# Patient Record
Sex: Male | Born: 1960 | Hispanic: No | Marital: Married | Smoking: Former smoker
Health system: Southern US, Community
[De-identification: ages and names within clinical notes are randomized; demographics above are authoritative.]

## PROBLEM LIST (undated history)

## (undated) DIAGNOSIS — E119 Type 2 diabetes mellitus without complications: Secondary | ICD-10-CM

## (undated) DIAGNOSIS — C8330 Diffuse large B-cell lymphoma, unspecified site: Secondary | ICD-10-CM

## (undated) DIAGNOSIS — E039 Hypothyroidism, unspecified: Secondary | ICD-10-CM

## (undated) DIAGNOSIS — I10 Essential (primary) hypertension: Secondary | ICD-10-CM

## (undated) HISTORY — PX: OTHER SURGICAL HISTORY: SHX169

## (undated) HISTORY — PX: CARPAL TUNNEL RELEASE: SHX101

---

## 2015-11-23 DIAGNOSIS — R7303 Prediabetes: Secondary | ICD-10-CM | POA: Insufficient documentation

## 2016-11-12 DIAGNOSIS — K429 Umbilical hernia without obstruction or gangrene: Secondary | ICD-10-CM | POA: Insufficient documentation

## 2017-04-24 ENCOUNTER — Other Ambulatory Visit: Payer: Self-pay

## 2017-04-24 ENCOUNTER — Encounter
Admission: RE | Admit: 2017-04-24 | Discharge: 2017-04-24 | Disposition: A | Discharge status: Home / Self Care | Payer: BLUE CROSS/BLUE SHIELD | Source: Ambulatory Visit | Attending: Surgery | Admitting: Surgery

## 2017-04-24 HISTORY — DX: Essential (primary) hypertension: I10

## 2017-04-24 HISTORY — DX: Hypothyroidism, unspecified: E03.9

## 2017-04-24 NOTE — Patient Instructions (Signed)
Your procedure is scheduled on: 05-02-17 FRIDAY Report to Same Day Surgery 2nd floor medical mall [facility] Entrance-take elevator on left to 2nd floor.  Check in with surgery information desk.) To find out your arrival time please call [phone] between 1PM - 3PM on 05-01-17 THURSDAY  Remember: Instructions that are not followed completely may result in serious medical risk, up to and including death, or upon the discretion of your surgeon and anesthesiologist your surgery may need to be rescheduled.    _x___ 1. Do not eat food after midnight the night before your procedure. NO GUM CHEWING OR CANDY AFTER MIDNIGHT.  You may drink clear liquids up to 2 hours before you are scheduled to arrive at the hospital for your procedure.  Do not drink clear liquids within 2 hours of your scheduled arrival to the hospital.  Clear liquids include  --Water or Apple juice without pulp  --Clear carbohydrate beverage such as ClearFast or Gatorade  --Black Coffee or Clear Tea (No milk, no creamers, do not add anything to the coffee or Tea)      __x__ 2. No Alcohol for 24 hours before or after surgery.   __x__3. No Smoking for 24 prior to surgery.   ____  4. Bring all medications with you on the day of surgery if instructed.    __x__ 5. Notify your doctor if there is any change in your medical condition     (cold, fever, infections).     Do not wear jewelry, make-up, hairpins, clips or nail polish.  Do not wear lotions, powders, or perfumes. You may wear deodorant.  Do not shave 48 hours prior to surgery. Men may shave face and neck.  Do not bring valuables to the hospital.    [facility] is not responsible for any belongings or valuables.               Contacts, dentures or bridgework may not be worn into surgery.  Leave your suitcase in the car. After surgery it may be brought to your room.  For patients admitted to the hospital, discharge time is determined by your treatment  team.   Patients discharged the day of surgery will not be allowed to drive home.  You will need someone to drive you home and stay with you the night of your procedure.    Please read over the following fact sheets that you were given:   [facility] Preparing for Surgery and or MRSA Information   _x___ TAKE THE FOLLOWING MEDICATION THE MORNING OF SURGERY WITH A SMALL SIP OF WATER. These include:  1. LEVOTHYROXINE  2.  3.  4.  5.  6.  ____Fleets enema or Magnesium Citrate as directed.   _x___ Use CHG Soap or sage wipes as directed on instruction sheet   ____ Use inhalers on the day of surgery and bring to hospital day of surgery  ____ Stop Metformin and Janumet 2 days prior to surgery.    ____ Take 1/2 of usual insulin dose the night before surgery and none on the morning surgery.   _x___ Follow recommendations from Cardiologist, Pulmonologist or PCP regarding stopping Aspirin, Coumadin, Plavix ,Eliquis, Effient, or Pradaxa, and Pletal-PT AWARE TO STOP ASPIRIN 1 WEEK PRIOR TO SURGERY  X____Stop Anti-inflammatories such as Advil, Aleve, IBUPROFEN, Motrin, Naproxen, Naprosyn, Goodies powders or aspirin products-STOP IBUPROFEN 48 HOURS PRIOR TO SURGERY-OK to take Tylenol WITHIN THOSE 2 DAYS   _x___ Stop supplements until after surgery-STOP GINGKO BILOBA NOW-MAY RESUME  AFTER SURGERY   ____ Bring C-Pap to the hospital.

## 2017-04-28 ENCOUNTER — Encounter
Admission: RE | Admit: 2017-04-28 | Discharge: 2017-04-28 | Disposition: A | Discharge status: Home / Self Care | Payer: BLUE CROSS/BLUE SHIELD | Source: Ambulatory Visit | Attending: Surgery | Admitting: Surgery

## 2017-04-28 DIAGNOSIS — I10 Essential (primary) hypertension: Secondary | ICD-10-CM | POA: Insufficient documentation

## 2017-05-01 ENCOUNTER — Encounter: Payer: Self-pay | Admitting: *Deleted

## 2017-05-01 MED ORDER — CEFAZOLIN SODIUM 10 G IJ SOLR
3.0000 g | Freq: Once | INTRAMUSCULAR | Status: AC
  Administered 2017-05-02: 3 g via INTRAVENOUS
  Filled 2017-05-01: qty 3000

## 2017-05-02 ENCOUNTER — Ambulatory Visit
Admission: RE | Admit: 2017-05-02 | Discharge: 2017-05-02 | Disposition: A | Discharge status: Home / Self Care | Payer: BLUE CROSS/BLUE SHIELD | Source: Ambulatory Visit | Attending: Surgery | Admitting: Surgery

## 2017-05-02 ENCOUNTER — Ambulatory Visit: Payer: BLUE CROSS/BLUE SHIELD | Admitting: Anesthesiology

## 2017-05-02 ENCOUNTER — Encounter
Admission: RE | Disposition: A | Discharge status: Home / Self Care | Payer: Self-pay | Source: Ambulatory Visit | Attending: Surgery

## 2017-05-02 DIAGNOSIS — Z79890 Hormone replacement therapy (postmenopausal): Secondary | ICD-10-CM | POA: Insufficient documentation

## 2017-05-02 DIAGNOSIS — Z6841 Body Mass Index (BMI) 40.0 and over, adult: Secondary | ICD-10-CM | POA: Diagnosis not present

## 2017-05-02 DIAGNOSIS — E039 Hypothyroidism, unspecified: Secondary | ICD-10-CM | POA: Insufficient documentation

## 2017-05-02 DIAGNOSIS — I10 Essential (primary) hypertension: Secondary | ICD-10-CM | POA: Insufficient documentation

## 2017-05-02 DIAGNOSIS — K429 Umbilical hernia without obstruction or gangrene: Secondary | ICD-10-CM | POA: Diagnosis not present

## 2017-05-02 DIAGNOSIS — J449 Chronic obstructive pulmonary disease, unspecified: Secondary | ICD-10-CM | POA: Diagnosis not present

## 2017-05-02 DIAGNOSIS — Z87891 Personal history of nicotine dependence: Secondary | ICD-10-CM | POA: Insufficient documentation

## 2017-05-02 HISTORY — PX: UMBILICAL HERNIA REPAIR: SHX196

## 2017-05-02 HISTORY — DX: Morbid (severe) obesity due to excess calories: E66.01

## 2017-05-02 HISTORY — PX: INSERTION OF MESH: SHX5868

## 2017-05-02 SURGERY — REPAIR, HERNIA, UMBILICAL, ADULT
Anesthesia: General | Site: Abdomen | Wound class: Clean

## 2017-05-02 MED ORDER — SUCCINYLCHOLINE CHLORIDE 20 MG/ML IJ SOLN
INTRAMUSCULAR | Status: DC | PRN
  Administered 2017-05-02: 200 mg via INTRAVENOUS

## 2017-05-02 MED ORDER — FENTANYL CITRATE (PF) 100 MCG/2ML IJ SOLN
INTRAMUSCULAR | Status: AC
  Filled 2017-05-02: qty 2

## 2017-05-02 MED ORDER — BUPIVACAINE HCL (PF) 0.5 % IJ SOLN
INTRAMUSCULAR | Status: AC
  Filled 2017-05-02: qty 30

## 2017-05-02 MED ORDER — MIDAZOLAM HCL 2 MG/2ML IJ SOLN
INTRAMUSCULAR | Status: DC | PRN
  Administered 2017-05-02: 2 mg via INTRAVENOUS

## 2017-05-02 MED ORDER — DEXAMETHASONE SODIUM PHOSPHATE 10 MG/ML IJ SOLN
INTRAMUSCULAR | Status: DC | PRN
  Administered 2017-05-02: 10 mg via INTRAVENOUS

## 2017-05-02 MED ORDER — SUGAMMADEX SODIUM 500 MG/5ML IV SOLN
INTRAVENOUS | Status: AC
  Filled 2017-05-02: qty 5

## 2017-05-02 MED ORDER — SUGAMMADEX SODIUM 200 MG/2ML IV SOLN
INTRAVENOUS | Status: DC | PRN
  Administered 2017-05-02: 301.2 mg via INTRAVENOUS

## 2017-05-02 MED ORDER — PROPOFOL 10 MG/ML IV BOLUS
INTRAVENOUS | Status: AC
  Filled 2017-05-02: qty 20

## 2017-05-02 MED ORDER — LIDOCAINE HCL (PF) 2 % IJ SOLN
INTRAMUSCULAR | Status: AC
  Filled 2017-05-02: qty 10

## 2017-05-02 MED ORDER — FAMOTIDINE 20 MG PO TABS
20.0000 mg | ORAL_TABLET | Freq: Once | ORAL | Status: AC
  Administered 2017-05-02: 20 mg via ORAL

## 2017-05-02 MED ORDER — DEXAMETHASONE SODIUM PHOSPHATE 10 MG/ML IJ SOLN
INTRAMUSCULAR | Status: AC
  Filled 2017-05-02: qty 1

## 2017-05-02 MED ORDER — ONDANSETRON HCL 4 MG/2ML IJ SOLN
INTRAMUSCULAR | Status: AC
  Filled 2017-05-02: qty 2

## 2017-05-02 MED ORDER — OXYCODONE HCL 5 MG PO TABS
ORAL_TABLET | ORAL | Status: AC
  Filled 2017-05-02: qty 1

## 2017-05-02 MED ORDER — LACTATED RINGERS IV SOLN
INTRAVENOUS | Status: DC
  Administered 2017-05-02 (×2): via INTRAVENOUS

## 2017-05-02 MED ORDER — FENTANYL CITRATE (PF) 100 MCG/2ML IJ SOLN: 25.0000 ug | INTRAMUSCULAR | Status: DC | PRN

## 2017-05-02 MED ORDER — HYDROCODONE-ACETAMINOPHEN 5-325 MG PO TABS: 1.0000 | ORAL_TABLET | Freq: Four times a day (QID) | ORAL | 0 refills | Status: DC | PRN

## 2017-05-02 MED ORDER — OXYCODONE HCL 5 MG PO TABS
5.0000 mg | ORAL_TABLET | Freq: Once | ORAL | Status: AC | PRN
  Administered 2017-05-02: 5 mg via ORAL

## 2017-05-02 MED ORDER — LIDOCAINE HCL (CARDIAC) 20 MG/ML IV SOLN
INTRAVENOUS | Status: DC | PRN
  Administered 2017-05-02: 100 mg via INTRAVENOUS

## 2017-05-02 MED ORDER — FAMOTIDINE 20 MG PO TABS
ORAL_TABLET | ORAL | Status: AC
  Administered 2017-05-02: 20 mg via ORAL
  Filled 2017-05-02: qty 1

## 2017-05-02 MED ORDER — OXYCODONE HCL 5 MG/5ML PO SOLN: 5.0000 mg | Freq: Once | ORAL | Status: AC | PRN

## 2017-05-02 MED ORDER — ROCURONIUM BROMIDE 50 MG/5ML IV SOLN
INTRAVENOUS | Status: AC
  Filled 2017-05-02: qty 1

## 2017-05-02 MED ORDER — PROMETHAZINE HCL 25 MG/ML IJ SOLN: 6.2500 mg | INTRAMUSCULAR | Status: DC | PRN

## 2017-05-02 MED ORDER — MEPERIDINE HCL 50 MG/ML IJ SOLN: 6.2500 mg | INTRAMUSCULAR | Status: DC | PRN

## 2017-05-02 MED ORDER — ONDANSETRON HCL 4 MG/2ML IJ SOLN
INTRAMUSCULAR | Status: DC | PRN
  Administered 2017-05-02: 4 mg via INTRAVENOUS

## 2017-05-02 MED ORDER — FENTANYL CITRATE (PF) 100 MCG/2ML IJ SOLN
INTRAMUSCULAR | Status: DC | PRN
  Administered 2017-05-02 (×2): 100 ug via INTRAVENOUS

## 2017-05-02 MED ORDER — BUPIVACAINE-EPINEPHRINE (PF) 0.5% -1:200000 IJ SOLN
INTRAMUSCULAR | Status: DC | PRN
  Administered 2017-05-02: 9 mL

## 2017-05-02 MED ORDER — SUCCINYLCHOLINE CHLORIDE 20 MG/ML IJ SOLN
INTRAMUSCULAR | Status: AC
  Filled 2017-05-02: qty 1

## 2017-05-02 MED ORDER — ROCURONIUM BROMIDE 100 MG/10ML IV SOLN
INTRAVENOUS | Status: DC | PRN
  Administered 2017-05-02: 25 mg via INTRAVENOUS
  Administered 2017-05-02: 10 mg via INTRAVENOUS
  Administered 2017-05-02: 40 mg via INTRAVENOUS

## 2017-05-02 MED ORDER — PROPOFOL 10 MG/ML IV BOLUS
INTRAVENOUS | Status: DC | PRN
  Administered 2017-05-02: 200 mg via INTRAVENOUS

## 2017-05-02 MED ORDER — MIDAZOLAM HCL 2 MG/2ML IJ SOLN
INTRAMUSCULAR | Status: AC
  Filled 2017-05-02: qty 2

## 2017-05-02 SURGICAL SUPPLY — 25 items
BLADE SURG 15 STRL LF DISP TIS (BLADE) ×1 IMPLANT
BLADE SURG 15 STRL SS (BLADE) ×1
CANISTER SUCT 1200ML W/VALVE (MISCELLANEOUS) ×2 IMPLANT
CHLORAPREP W/TINT 26ML (MISCELLANEOUS) ×2 IMPLANT
DERMABOND ADVANCED (GAUZE/BANDAGES/DRESSINGS) ×1
DERMABOND ADVANCED .7 DNX12 (GAUZE/BANDAGES/DRESSINGS) ×1 IMPLANT
DRAPE LAPAROTOMY 77X122 PED (DRAPES) ×2 IMPLANT
ELECT REM PT RETURN 9FT ADLT (ELECTROSURGICAL) ×2
ELECTRODE REM PT RTRN 9FT ADLT (ELECTROSURGICAL) ×1 IMPLANT
GLOVE BIO SURGEON STRL SZ7.5 (GLOVE) ×2 IMPLANT
GOWN STRL REUS W/ TWL LRG LVL3 (GOWN DISPOSABLE) ×4 IMPLANT
GOWN STRL REUS W/TWL LRG LVL3 (GOWN DISPOSABLE) ×4
KIT RM TURNOVER STRD PROC AR (KITS) ×2 IMPLANT
LABEL OR SOLS (LABEL) ×2 IMPLANT
MESH SYNTHETIC 4X6 SOFT BARD (Mesh General) ×1 IMPLANT
MESH SYNTHETIC SOFT BARD 4X6 (Mesh General) ×1 IMPLANT
NEEDLE HYPO 25X1 1.5 SAFETY (NEEDLE) ×2 IMPLANT
NS IRRIG 500ML POUR BTL (IV SOLUTION) ×2 IMPLANT
PACK BASIN MINOR ARMC (MISCELLANEOUS) ×2 IMPLANT
SUT CHROMIC 3 0 SH 27 (SUTURE) ×2 IMPLANT
SUT CHROMIC 4 0 RB 1X27 (SUTURE) ×2 IMPLANT
SUT MNCRL+ 5-0 UNDYED PC-3 (SUTURE) ×1 IMPLANT
SUT MONOCRYL 5-0 (SUTURE) ×1
SUT SURGILON 0 30 BLK (SUTURE) ×4 IMPLANT
SYRINGE 10CC LL (SYRINGE) ×2 IMPLANT

## 2017-05-02 NOTE — Op Note (Signed)
OPERATIVE REPORT  PREOPERATIVE  DIAGNOSIS: Umbilical hernia  POSTOPERATIVE DIAGNOSIS: Umbilical hernia  PROCEDURE: Umbilical hernia repair  ANESTHESIA: General  SURGEON: Rochel Brome M.D.  INDICATIONS: He has a history of bulging at the umbilicus.  An umbilical hernia was demonstrated on physical exam and repair was recommended for definitive treatment.  The patient was placed on the operating table in the supine position under general anesthesia. The abdomen was prepared with ChloraPrep and draped in a sterile manner. A transversely oriented supraumbilical curvilinear incision was made and carried down through subcutaneous tissues.  The umbilical hernia sac was dissected free from the umbilicus and dissected free from surrounding tissues down into the fascial ring defect.  The sac was approximately 6 cm in length.  There was incarcerated fatty tissue which was reduced back into the abdominal cavity.  The sac was suture ligated with 3-0 chromic and amputated and was not submitted for pathology.  Bard soft mesh was cut to create an oval shape of 2.5 x 3.5 centimeters. It was placed into the properitoneal plane oriented transversely and sutured to the overlying fascia with through and through 0 Surgilon sutures.  The fascial defect was closed with a transversely oriented suture line of interrupted 0 Surgilon figure-of-eight sutures incorporating each suture into the mesh. The skin of the umbilicus was sutured to the subcutaneous tissues with 3-0 chromic suture. The subcutaneous tissues were infiltrated with half percent Sensorcaine with epinephrine. The skin was closed with running 5-0 Monocryl subcuticular suture and Dermabond.  The patient appeared to be in satisfactory condition and was prepared for transfer to the recovery room  [place].D.

## 2017-05-02 NOTE — Anesthesia Procedure Notes (Addendum)
Procedure Name: Intubation Date/Time: 05/02/2017 2:43 PM Performed by: Nelda Marseille, CRNA Pre-anesthesia Checklist: Patient identified, Patient being monitored, Timeout performed, Emergency Drugs available and Suction available Patient Re-evaluated:Patient Re-evaluated prior to induction Oxygen Delivery Method: Circle system utilized Preoxygenation: Pre-oxygenation with 100% oxygen Induction Type: IV induction Ventilation: Mask ventilation without difficulty Laryngoscope Size: Mac, McGraph and 4 Grade View: Grade III Tube type: Oral Tube size: 7.5 mm Number of attempts: 1 Airway Equipment and Method: Stylet Placement Confirmation: ETT inserted through vocal cords under direct vision,  positive ETCO2 and breath sounds checked- equal and bilateral Secured at: 24 cm Tube secured with: Tape Dental Injury: Teeth and Oropharynx as per pre-operative assessment

## 2017-05-02 NOTE — Anesthesia Post-op Follow-up Note (Signed)
Anesthesia QCDR form completed.        

## 2017-05-02 NOTE — Anesthesia Preprocedure Evaluation (Signed)
Anesthesia Evaluation  Patient identified by MRN, date of birth, ID band Patient awake    Reviewed: Allergy & Precautions, NPO status , Patient's Chart, lab work & pertinent test results  History of Anesthesia Complications Negative for: history of anesthetic complications  Airway Mallampati: II  TM Distance: >3 FB Neck ROM: Full    Dental no notable dental hx.    Pulmonary neg sleep apnea, neg COPD, former smoker,    breath sounds clear to auscultation- rhonchi (-) wheezing      Cardiovascular hypertension, Pt. on medications (-) CAD, (-) Past MI and (-) Cardiac Stents  Rhythm:Regular Rate:Normal - Systolic murmurs and - Diastolic murmurs    Neuro/Psych negative neurological ROS  negative psych ROS   GI/Hepatic negative GI ROS, Neg liver ROS,   Endo/Other  neg diabetesHypothyroidism   Renal/GU negative Renal ROS     Musculoskeletal negative musculoskeletal ROS (+)   Abdominal   Peds  Hematology negative hematology ROS (+)   Anesthesia Other Findings Past Medical History: No date: Hypertension No date: Hypothyroidism No date: Morbid obesity ([place])   Reproductive/Obstetrics                             Anesthesia Physical Anesthesia Plan  ASA: II  Anesthesia Plan: General   Post-op Pain Management:    Induction: Intravenous  PONV Risk Score and Plan: 1 and Dexamethasone and Ondansetron  Airway Management Planned: Oral ETT  Additional Equipment:   Intra-op Plan:   Post-operative Plan: Extubation in OR  Informed Consent: I have reviewed the patients History and Physical, chart, labs and discussed the procedure including the risks, benefits and alternatives for the proposed anesthesia with the patient or authorized representative who has indicated his/her understanding and acceptance.   Dental advisory given  Plan Discussed with: CRNA and Anesthesiologist  Anesthesia Plan  Comments:         Anesthesia Quick Evaluation

## 2017-05-02 NOTE — Transfer of Care (Signed)
Immediate Anesthesia Transfer of Care Note  Patient: Zachary Perkins  Procedure(s) Performed: HERNIA REPAIR UMBILICAL ADULT (N/A Abdomen) INSERTION OF MESH (N/A Abdomen)  Patient Location: PACU  Anesthesia Type:General  Level of Consciousness: awake, alert  and oriented  Airway & Oxygen Therapy: Patient Spontanous Breathing and Patient connected to face mask oxygen  Post-op Assessment: Report given to RN and Post -op Vital signs reviewed and stable  Post vital signs: Reviewed and stable  Last Vitals:  Vitals:   05/02/17 1250 05/02/17 1603  BP: (!) 145/88 (!) 106/57  Pulse: 62 62  Resp: 18 12  Temp: 36.8 C (!) 36.3 C  SpO2: 100% 100%    Last Pain:  Vitals:   05/02/17 1250  TempSrc: Oral         Complications: No apparent anesthesia complications

## 2017-05-02 NOTE — Discharge Instructions (Addendum)
Take Tylenol or Norco if needed for pain.  She will not drive or do anything dangerous when taking Norco.  May resume aspirin on Sunday.  May shower and blot dry.  Avoid straining and heavy lifting   AMBULATORY SURGERY  DISCHARGE INSTRUCTIONS   1) The drugs that you were given will stay in your system until tomorrow so for the next 24 hours you should not:  A) Drive an automobile B) Make any legal decisions C) Drink any alcoholic beverage   2) You may resume regular meals tomorrow.  Today it is better to start with liquids and gradually work up to solid foods.  You may eat anything you prefer, but it is better to start with liquids, then soup and crackers, and gradually work up to solid foods.   3) Please notify your doctor immediately if you have any unusual bleeding, trouble breathing, redness and pain at the surgery site, drainage, fever, or pain not relieved by medication.   4) Additional Instructions:Splint abdominal incision with pillow when you cough, sneeze or move.

## 2017-05-02 NOTE — OR Nursing (Signed)
Dr. Tamala Julian into see pt, ok to dc home.

## 2017-05-02 NOTE — H&P (Signed)
  He comes then prepared for umbilical hernia repair. He reports no change in overall condition since the office exam. Lab work was removed. The hernia was identified on physical exam. I discussed the plan for surgery.

## 2017-05-02 NOTE — Anesthesia Postprocedure Evaluation (Signed)
Anesthesia Post Note  Patient: Zachary Perkins  Procedure(s) Performed: HERNIA REPAIR UMBILICAL ADULT (N/A Abdomen) INSERTION OF MESH (N/A Abdomen)  Patient location during evaluation: PACU Anesthesia Type: General Level of consciousness: awake and alert and oriented Pain management: pain level controlled Vital Signs Assessment: post-procedure vital signs reviewed and stable Respiratory status: spontaneous breathing, nonlabored ventilation and respiratory function stable Cardiovascular status: blood pressure returned to baseline and stable Postop Assessment: no signs of nausea or vomiting Anesthetic complications: no     Last Vitals:  Vitals:   05/02/17 1618 05/02/17 1626  BP: 135/87   Pulse: (!) 58 (!) 58  Resp: 10 (!) 7  Temp:    SpO2: 98% 95%    Last Pain:  Vitals:   05/02/17 1614  TempSrc:   PainSc: 4                  Isaac Dubie

## 2017-05-05 ENCOUNTER — Encounter: Payer: Self-pay | Admitting: Surgery

## 2018-05-19 ENCOUNTER — Emergency Department
Admission: EM | Admit: 2018-05-19 | Discharge: 2018-05-19 | Disposition: A | Discharge status: Home / Self Care | Payer: BLUE CROSS/BLUE SHIELD | Attending: Emergency Medicine | Admitting: Emergency Medicine

## 2018-05-19 ENCOUNTER — Other Ambulatory Visit: Payer: Self-pay

## 2018-05-19 ENCOUNTER — Emergency Department: Payer: BLUE CROSS/BLUE SHIELD

## 2018-05-19 DIAGNOSIS — M79602 Pain in left arm: Secondary | ICD-10-CM | POA: Insufficient documentation

## 2018-05-19 DIAGNOSIS — J069 Acute upper respiratory infection, unspecified: Secondary | ICD-10-CM | POA: Diagnosis not present

## 2018-05-19 DIAGNOSIS — E039 Hypothyroidism, unspecified: Secondary | ICD-10-CM | POA: Diagnosis not present

## 2018-05-19 DIAGNOSIS — R0602 Shortness of breath: Secondary | ICD-10-CM | POA: Diagnosis present

## 2018-05-19 DIAGNOSIS — I10 Essential (primary) hypertension: Secondary | ICD-10-CM | POA: Insufficient documentation

## 2018-05-19 DIAGNOSIS — Z87891 Personal history of nicotine dependence: Secondary | ICD-10-CM | POA: Insufficient documentation

## 2018-05-19 DIAGNOSIS — E119 Type 2 diabetes mellitus without complications: Secondary | ICD-10-CM | POA: Insufficient documentation

## 2018-05-19 HISTORY — DX: Type 2 diabetes mellitus without complications: E11.9

## 2018-05-19 LAB — CBC
HCT: 42.9 % (ref 39.0–52.0)
Hemoglobin: 14.1 g/dL (ref 13.0–17.0)
MCH: 27.2 pg (ref 26.0–34.0)
MCHC: 32.9 g/dL (ref 30.0–36.0)
MCV: 82.8 fL (ref 80.0–100.0)
Platelets: 289 10*3/uL (ref 150–400)
RBC: 5.18 MIL/uL (ref 4.22–5.81)
RDW: 12.8 % (ref 11.5–15.5)
WBC: 7.2 10*3/uL (ref 4.0–10.5)
nRBC: 0 % (ref 0.0–0.2)

## 2018-05-19 LAB — BASIC METABOLIC PANEL
Anion gap: 10 (ref 5–15)
BUN: 17 mg/dL (ref 6–20)
CO2: 27 mmol/L (ref 22–32)
Calcium: 8.9 mg/dL (ref 8.9–10.3)
Chloride: 100 mmol/L (ref 98–111)
Creatinine, Ser: 0.82 mg/dL (ref 0.61–1.24)
GFR calc non Af Amer: >60 mL/min (ref >60)
Glucose, Bld: 130 mg/dL — ABNORMAL HIGH (ref 70–99)
Potassium: 3.3 mmol/L — ABNORMAL LOW (ref 3.5–5.1)
Sodium: 137 mmol/L (ref 135–145)

## 2018-05-19 LAB — TROPONIN I: Troponin I: <0.03 ng/mL (ref <0.03)

## 2018-05-19 NOTE — ED Notes (Signed)
Pt was seen leaving room as MD was printing off his discharge papers. They were taken to the front desk if pt returns for them. NAD.

## 2018-05-19 NOTE — ED Triage Notes (Addendum)
C/o SOB, cough, and congestion yesterday. Left upper arm pain, described and aching and tingling that began today after his workout today. Equal grip strength, motor WNL of upper extremities.  Pt alert and oriented X4, active, cooperative, pt in NAD. RR even and unlabored, color WNL.

## 2018-05-19 NOTE — ED Provider Notes (Signed)
[facility] Emergency Department Provider Note       Time seen: ----------------------------------------- 2:10 PM on 05/19/2018 -----------------------------------------   I have reviewed the triage vital signs and the nursing notes.  HISTORY   Chief Complaint Shortness of Breath and Arm Pain    HPI Zachary Perkins is a 57 y.o. male with a history of borderline diabetes, hypertension, hypothyroidism and morbid obesity who presents to the ED for shortness of breath and cough and congestion since yesterday.  Patient states even while he is talking he feels phlegm in the back of his throat.  He has describes some left upper arm pain, describes as aching and tingling that began after his workout today.  He typically does cardio using an elliptical machine.  He denies any specific chest pain.  Past Medical History:  Diagnosis Date  . Diabetes mellitus without complication ([place])   . Hypertension   . Hypothyroidism   . Morbid obesity ([place])     There are no active problems to display for this patient.   Past Surgical History:  Procedure Laterality Date  . CARPAL TUNNEL RELEASE Left   . INSERTION OF MESH N/A 05/02/2017   Procedure: INSERTION OF MESH;  Surgeon: Leonie Green, MD;  Location: [facility] ORS;  Service: General;  Laterality: N/A;  . UMBILICAL HERNIA REPAIR N/A 05/02/2017   Procedure: HERNIA REPAIR UMBILICAL ADULT;  Surgeon: Leonie Green, MD;  Location: [facility] ORS;  Service: General;  Laterality: N/A;    Allergies Lisinopril and Metformin and related  Social History Social History   Tobacco Use  . Smoking status: Former Smoker    Packs/day: 1.00    Years: 20.00    Pack years: 20.00    Types: Cigarettes    Last attempt to quit: 04/25/2007    Years since quitting: 11.0  . Smokeless tobacco: Never Used  Substance Use Topics  . Alcohol use: Yes    Comment: rare  . Drug use: No   Review of Systems Constitutional: Negative for  fever. Cardiovascular: Negative for chest pain. ENT: Positive for congestion Respiratory: Positive for shortness of breath Gastrointestinal: Negative for abdominal pain, vomiting and diarrhea. Musculoskeletal: Positive for left arm pain Skin: Negative for rash. Neurological: Negative for headaches, focal weakness or numbness.  All systems negative/normal/unremarkable except as stated in the HPI  ____________________________________________   PHYSICAL EXAM:  VITAL SIGNS: ED Triage Vitals  Enc Vitals Group     BP --      Pulse Rate 05/19/18 1132 61     Resp --      Temp 05/19/18 1132 98.2 F (36.8 C)     Temp Source 05/19/18 1132 Oral     SpO2 05/19/18 1132 97 %     Weight 05/19/18 1135 (!) 325 lb (147.4 kg)     Height 05/19/18 1135 6\' 5"  (1.956 m)     Head Circumference --      Peak Flow --      Pain Score 05/19/18 1135 2     Pain Loc --      Pain Edu? --      Excl. in [place]? --    Constitutional: Alert and oriented. Well appearing and in no distress. Eyes: Conjunctivae are normal. Normal extraocular movements. ENT   Head: Normocephalic and atraumatic.   Nose: No congestion/rhinnorhea.   Mouth/Throat: Mucous membranes are moist.   Neck: No stridor. Cardiovascular: Normal rate, regular rhythm. No murmurs, rubs, or gallops. Respiratory: Normal respiratory effort without  tachypnea nor retractions. Breath sounds are clear and equal bilaterally. No wheezes/rales/rhonchi. Gastrointestinal: Soft and nontender. Normal bowel sounds Musculoskeletal: Nontender with normal range of motion in extremities. No lower extremity tenderness nor edema. Neurologic:  Normal speech and language. No gross focal neurologic deficits are appreciated.  Skin:  Skin is warm, dry and intact. No rash noted. Psychiatric: Mood and affect are normal. Speech and behavior are normal.  ____________________________________________  EKG: Interpreted by me.  Sinus rhythm the rate of 63 bpm,  borderline LVH criteria, normal axis, normal QT  ____________________________________________  ED COURSE:  As part of my medical decision making, I reviewed the following data within the [place] History obtained from family if available, nursing notes, old chart and ekg, as well as notes from prior ED visits. Patient presented for upper respiratory complaints as well as left arm pain, we will assess with labs and imaging as indicated at this time.   Procedures ____________________________________________   LABS (pertinent positives/negatives)  Labs Reviewed  BASIC METABOLIC PANEL - Abnormal; Notable for the following components:      Result Value   Potassium 3.3 (*)    Glucose, Bld 130 (*)    All other components within normal limits  CBC  TROPONIN I    RADIOLOGY  Chest x-ray is unremarkable  ____________________________________________  DIFFERENTIAL DIAGNOSIS   Musculoskeletal pain, URI, pneumonia, GERD, sinusitis, unstable angina  FINAL ASSESSMENT AND PLAN  URI, left arm pain   Plan: The patient had presented for congestion and upper respiratory symptoms as well as left arm pain after exercising. Patient's labs were unremarkable. Patient's imaging was also unremarkable.  I have advised over-the-counter treatment.  He is cleared for outpatient follow-up.   Laurence Aly, MD   Note: This note was generated in part or whole with voice recognition software. Voice recognition is usually quite accurate but there are transcription errors that can and very often do occur. I apologize for any typographical errors that were not detected and corrected.     Earleen Newport, MD 05/19/18 [phone]

## 2021-02-20 ENCOUNTER — Encounter: Payer: Self-pay | Admitting: Emergency Medicine

## 2021-02-20 ENCOUNTER — Emergency Department: Payer: BC Managed Care – PPO

## 2021-02-20 ENCOUNTER — Other Ambulatory Visit: Payer: Self-pay

## 2021-02-20 ENCOUNTER — Inpatient Hospital Stay
Admission: EM | Admit: 2021-02-20 | Discharge: 2021-02-26 | DRG: 842 | Disposition: A | Discharge status: Home / Self Care | Payer: BC Managed Care – PPO | Attending: Internal Medicine | Admitting: Internal Medicine

## 2021-02-20 DIAGNOSIS — K2970 Gastritis, unspecified, without bleeding: Secondary | ICD-10-CM | POA: Diagnosis present

## 2021-02-20 DIAGNOSIS — R634 Abnormal weight loss: Secondary | ICD-10-CM | POA: Diagnosis present

## 2021-02-20 DIAGNOSIS — Z79890 Hormone replacement therapy (postmenopausal): Secondary | ICD-10-CM | POA: Diagnosis not present

## 2021-02-20 DIAGNOSIS — G43909 Migraine, unspecified, not intractable, without status migrainosus: Secondary | ICD-10-CM | POA: Diagnosis present

## 2021-02-20 DIAGNOSIS — E039 Hypothyroidism, unspecified: Secondary | ICD-10-CM | POA: Diagnosis present

## 2021-02-20 DIAGNOSIS — Z87891 Personal history of nicotine dependence: Secondary | ICD-10-CM

## 2021-02-20 DIAGNOSIS — K5909 Other constipation: Secondary | ICD-10-CM | POA: Diagnosis not present

## 2021-02-20 DIAGNOSIS — R110 Nausea: Secondary | ICD-10-CM | POA: Diagnosis present

## 2021-02-20 DIAGNOSIS — E119 Type 2 diabetes mellitus without complications: Secondary | ICD-10-CM | POA: Diagnosis present

## 2021-02-20 DIAGNOSIS — D509 Iron deficiency anemia, unspecified: Secondary | ICD-10-CM | POA: Diagnosis present

## 2021-02-20 DIAGNOSIS — K2980 Duodenitis without bleeding: Secondary | ICD-10-CM | POA: Diagnosis present

## 2021-02-20 DIAGNOSIS — Z20822 Contact with and (suspected) exposure to covid-19: Secondary | ICD-10-CM | POA: Diagnosis present

## 2021-02-20 DIAGNOSIS — Z6833 Body mass index (BMI) 33.0-33.9, adult: Secondary | ICD-10-CM | POA: Diagnosis not present

## 2021-02-20 DIAGNOSIS — E669 Obesity, unspecified: Secondary | ICD-10-CM | POA: Diagnosis present

## 2021-02-20 DIAGNOSIS — Z79899 Other long term (current) drug therapy: Secondary | ICD-10-CM | POA: Diagnosis not present

## 2021-02-20 DIAGNOSIS — D649 Anemia, unspecified: Secondary | ICD-10-CM | POA: Diagnosis not present

## 2021-02-20 DIAGNOSIS — C8590 Non-Hodgkin lymphoma, unspecified, unspecified site: Secondary | ICD-10-CM

## 2021-02-20 DIAGNOSIS — C8513 Unspecified B-cell lymphoma, intra-abdominal lymph nodes: Principal | ICD-10-CM | POA: Diagnosis present

## 2021-02-20 DIAGNOSIS — K5900 Constipation, unspecified: Secondary | ICD-10-CM | POA: Diagnosis present

## 2021-02-20 DIAGNOSIS — Z7982 Long term (current) use of aspirin: Secondary | ICD-10-CM | POA: Diagnosis not present

## 2021-02-20 DIAGNOSIS — K3189 Other diseases of stomach and duodenum: Secondary | ICD-10-CM | POA: Diagnosis present

## 2021-02-20 DIAGNOSIS — Z888 Allergy status to other drugs, medicaments and biological substances status: Secondary | ICD-10-CM

## 2021-02-20 DIAGNOSIS — I10 Essential (primary) hypertension: Secondary | ICD-10-CM | POA: Diagnosis present

## 2021-02-20 DIAGNOSIS — C8598 Non-Hodgkin lymphoma, unspecified, lymph nodes of multiple sites: Secondary | ICD-10-CM | POA: Diagnosis not present

## 2021-02-20 DIAGNOSIS — R1902 Left upper quadrant abdominal swelling, mass and lump: Secondary | ICD-10-CM | POA: Diagnosis not present

## 2021-02-20 DIAGNOSIS — D500 Iron deficiency anemia secondary to blood loss (chronic): Secondary | ICD-10-CM

## 2021-02-20 DIAGNOSIS — R109 Unspecified abdominal pain: Secondary | ICD-10-CM

## 2021-02-20 LAB — COMPREHENSIVE METABOLIC PANEL
ALT: 30 U/L (ref 0–44)
AST: 28 U/L (ref 15–41)
Albumin: 3.2 g/dL — ABNORMAL LOW (ref 3.5–5.0)
Alkaline Phosphatase: 63 U/L (ref 38–126)
Anion gap: 10 (ref 5–15)
BUN: 20 mg/dL (ref 6–20)
CO2: 29 mmol/L (ref 22–32)
Calcium: 11.7 mg/dL — ABNORMAL HIGH (ref 8.9–10.3)
Chloride: 97 mmol/L — ABNORMAL LOW (ref 98–111)
Creatinine, Ser: 1.23 mg/dL (ref 0.61–1.24)
GFR, Estimated: >60 mL/min (ref >60)
Glucose, Bld: 111 mg/dL — ABNORMAL HIGH (ref 70–99)
Potassium: 3.6 mmol/L (ref 3.5–5.1)
Sodium: 136 mmol/L (ref 135–145)
Total Bilirubin: 0.3 mg/dL (ref 0.3–1.2)
Total Protein: 7.2 g/dL (ref 6.5–8.1)

## 2021-02-20 LAB — URINALYSIS, COMPLETE (UACMP) WITH MICROSCOPIC
Bacteria, UA: NONE SEEN
Bilirubin Urine: NEGATIVE
Glucose, UA: NEGATIVE mg/dL
Hgb urine dipstick: NEGATIVE
Ketones, ur: NEGATIVE mg/dL
Leukocytes,Ua: NEGATIVE
Nitrite: NEGATIVE
Protein, ur: NEGATIVE mg/dL
Specific Gravity, Urine: 1.01 (ref 1.005–1.030)
pH: 5.5 (ref 5.0–8.0)

## 2021-02-20 LAB — IRON AND TIBC
Iron: 16 ug/dL — ABNORMAL LOW (ref 45–182)
Saturation Ratios: 6 % — ABNORMAL LOW (ref 17.9–39.5)
TIBC: 281 ug/dL (ref 250–450)
UIBC: 265 ug/dL

## 2021-02-20 LAB — FERRITIN: Ferritin: 58 ng/mL (ref 24–336)

## 2021-02-20 LAB — CBC
HCT: 23.3 % — ABNORMAL LOW (ref 39.0–52.0)
Hemoglobin: 7 g/dL — ABNORMAL LOW (ref 13.0–17.0)
MCH: 21.8 pg — ABNORMAL LOW (ref 26.0–34.0)
MCHC: 30 g/dL (ref 30.0–36.0)
MCV: 72.6 fL — ABNORMAL LOW (ref 80.0–100.0)
Platelets: 325 10*3/uL (ref 150–400)
RBC: 3.21 MIL/uL — ABNORMAL LOW (ref 4.22–5.81)
RDW: 16.1 % — ABNORMAL HIGH (ref 11.5–15.5)
WBC: 7.5 10*3/uL (ref 4.0–10.5)
nRBC: 0 % (ref 0.0–0.2)

## 2021-02-20 LAB — PROTIME-INR
INR: 1.2 (ref 0.8–1.2)
Prothrombin Time: 15.4 seconds — ABNORMAL HIGH (ref 11.4–15.2)

## 2021-02-20 LAB — PREPARE RBC (CROSSMATCH)

## 2021-02-20 LAB — LIPASE, BLOOD: Lipase: 18 U/L (ref 11–51)

## 2021-02-20 LAB — APTT: aPTT: 29 seconds (ref 24–36)

## 2021-02-20 LAB — ABO/RH: ABO/RH(D): O POS

## 2021-02-20 LAB — FOLATE: Folate: 27 ng/mL (ref >5.9)

## 2021-02-20 LAB — RESP PANEL BY RT-PCR (FLU A&B, COVID) ARPGX2
Influenza A by PCR: NEGATIVE
Influenza B by PCR: NEGATIVE
SARS Coronavirus 2 by RT PCR: NEGATIVE

## 2021-02-20 MED ORDER — ONDANSETRON HCL 4 MG/2ML IJ SOLN
4.0000 mg | Freq: Once | INTRAMUSCULAR | Status: AC
  Administered 2021-02-20: 4 mg via INTRAVENOUS
  Filled 2021-02-20: qty 2

## 2021-02-20 MED ORDER — DEXTROSE-NACL 5-0.9 % IV SOLN: INTRAVENOUS | Status: DC

## 2021-02-20 MED ORDER — OXYCODONE HCL 5 MG PO TABS
5.0000 mg | ORAL_TABLET | ORAL | Status: DC | PRN
  Administered 2021-02-21 – 2021-02-23 (×2): 5 mg via ORAL
  Filled 2021-02-20 (×2): qty 1

## 2021-02-20 MED ORDER — HYDROMORPHONE HCL 1 MG/ML IJ SOLN
0.5000 mg | Freq: Once | INTRAMUSCULAR | Status: AC
  Administered 2021-02-20: 0.5 mg via INTRAVENOUS
  Filled 2021-02-20: qty 1

## 2021-02-20 MED ORDER — HYDROMORPHONE HCL 1 MG/ML IJ SOLN
0.5000 mg | INTRAMUSCULAR | Status: DC | PRN
  Administered 2021-02-21 – 2021-02-22 (×6): 1 mg via INTRAVENOUS
  Filled 2021-02-20 (×6): qty 1

## 2021-02-20 MED ORDER — PANTOPRAZOLE 80MG IVPB - SIMPLE MED
80.0000 mg | Freq: Once | INTRAVENOUS | Status: AC
  Administered 2021-02-20: 80 mg via INTRAVENOUS
  Filled 2021-02-20: qty 80

## 2021-02-20 MED ORDER — ONDANSETRON HCL 4 MG/2ML IJ SOLN
4.0000 mg | Freq: Four times a day (QID) | INTRAMUSCULAR | Status: DC | PRN
  Administered 2021-02-20 – 2021-02-24 (×11): 4 mg via INTRAVENOUS
  Filled 2021-02-20 (×11): qty 2

## 2021-02-20 MED ORDER — ACETAMINOPHEN 650 MG RE SUPP: 650.0000 mg | Freq: Four times a day (QID) | RECTAL | Status: DC | PRN

## 2021-02-20 MED ORDER — INSULIN ASPART 100 UNIT/ML IJ SOLN
0.0000 [IU] | Freq: Three times a day (TID) | INTRAMUSCULAR | Status: DC
  Administered 2021-02-24 (×2): 2 [IU] via SUBCUTANEOUS
  Filled 2021-02-20 (×3): qty 1

## 2021-02-20 MED ORDER — ACETAMINOPHEN 325 MG PO TABS
650.0000 mg | ORAL_TABLET | Freq: Four times a day (QID) | ORAL | Status: DC | PRN
  Administered 2021-02-20: 650 mg via ORAL
  Filled 2021-02-20: qty 2

## 2021-02-20 MED ORDER — SODIUM CHLORIDE 0.9% IV SOLUTION
Freq: Once | INTRAVENOUS | Status: AC
  Filled 2021-02-20: qty 250

## 2021-02-20 MED ORDER — IOHEXOL 350 MG/ML SOLN
100.0000 mL | Freq: Once | INTRAVENOUS | Status: AC | PRN
  Administered 2021-02-20: 100 mL via INTRAVENOUS
  Filled 2021-02-20: qty 100

## 2021-02-20 MED ORDER — PANTOPRAZOLE SODIUM 40 MG IV SOLR
40.0000 mg | Freq: Two times a day (BID) | INTRAVENOUS | Status: DC
  Administered 2021-02-20 – 2021-02-23 (×7): 40 mg via INTRAVENOUS
  Filled 2021-02-20 (×7): qty 40

## 2021-02-20 NOTE — ED Notes (Signed)
Consent signed for blood

## 2021-02-20 NOTE — H&P (Addendum)
History and Physical    Adelaido Kleinfeld [id number] DOB: 01/29/1961 DOA: 02/20/2021  PCP: Sharyne Peach, MD  Patient coming from: home   Chief Complaint: abd. Pain, n/constipation  HPI: Cardae Sapp is a 60 y.o. male with medical history significant of diabetes, hypertension, morbid obesity, hypothyroidism presents with abdominal pain, nausea vomiting constipation.  Patient's had worsening abdominal pain over the last week but has had left lower quadrant abdominal pain for months.  Patient states last normal bowel movement was 2 weeks ago usually has several bowel movements a day but having more trouble having a bowel movement despite taking over-the-counter stool softeners and Metamucil.  Patient is getting a small amount of liquid stool.  Patient endorses a fever of 100 at home.  Patient has had 30 pound weight loss over the last 2 months as well.  Patient discussed with the ED provider Dr. Acquanetta Belling at [phone], Hemoccult was pending at that time with hemoglobin 7 from 10.2 two months ago in care everywhere-January 02, 2021. blood pressure is 150/98 respirations 19 temperature is 98.5.  Sodium 136 potassium 3.6 chloride 97 CO2 29 glucose 111 calcium 11.7 BUN/creatinine within normal limits GFR greater than 60.  Lipase within normal limits LFTs within normal limits except albumin 3.2.  Iron is 16 U IBC TIBC 2 65-81 respectively ferritin 58.  Patient does have some black stools but nothing more than usual with home iron supplements.  No bright red blood per rectum.  Hemoglobin 7 from 14 in December 2019.  CT of abdomen showing large heterogeneous mass in left upper quadrant with central cavitation centered in the body of the stomach and spleen involving the splenic artery splenic vein and tail of the pancreas favor gastric or splenic primary neoplasm.  Enlarged periportal lymph nodes concerning for nodal metastatic disease.  Trace left pleural effusion and trace free fluid in the pelvis.  Mass is  12 x 11 spleen and stomach.  Area seen within the mass likely due to fistulization within the stomach and/or necrosis.  Patient given Protonix bolus in the ED, Dilaudid and Zofran.  Pt wife at  bedside.  Patient former smoker of 30+ years pack years.  Patient's abdominal pain 3 out of 10 left upper quadrant at this time.  Patient states he had 2 bowel movements today one was loose and brown the other one was also loose and brown but had some black consistency with it.  Patient is on iron tablets which he took today.  Patient notices some decreased energy and decreased appetite over the last 2 months as well.  Patient has never had a colonoscopy or EGD.    Requested ED to touch base with general surgery concerning mass and vessel involvement, ED provider discussed with Dr. Glade Lloyd as of 17:11.  Discussed with Dr. Richelle Ito intervention at this time, recommend GI consult.  I discussed with Dr. Virgina Jock GI in the fall recommend giving 1 unit of blood now n.p.o. after midnight for potential EGD tomorrow.  ED Course: As above.  Review of Systems: As per HPI otherwise all other systems reviewed and are negative.   Past Medical History:  Diagnosis Date   Diabetes mellitus without complication ([place])    Hypertension    Hypothyroidism    Morbid obesity ([place])     Past Surgical History:  Procedure Laterality Date   CARPAL TUNNEL RELEASE Left    INSERTION OF MESH N/A 05/02/2017   Procedure: INSERTION OF MESH;  Surgeon: Leodis Binet  Lavone Neri, MD;  Location: [facility] ORS;  Service: General;  Laterality: N/A;   UMBILICAL HERNIA REPAIR N/A 05/02/2017   Procedure: HERNIA REPAIR UMBILICAL ADULT;  Surgeon: Leonie Green, MD;  Location: [facility] ORS;  Service: General;  Laterality: N/A;    Social History  reports that he quit smoking about 13 years ago. His smoking use included cigarettes. He has a 20.00 pack-year smoking history. He has never used smokeless tobacco. He reports current alcohol use.  He reports that he does not use drugs.  Allergies  Allergen Reactions   Lisinopril Cough   Metformin And Related     PASSED OUT    No family history on file. Denies family history of gastric cancer  Prior to Admission medications   Medication Sig Start Date End Date Taking? Authorizing Provider  aspirin EC 81 MG tablet Take 81 mg by mouth daily.    [provider]  Ginkgo Biloba (GNP GINGKO BILOBA EXTRACT PO) Take 40 mg by mouth daily.    [provider]  hydrochlorothiazide (HYDRODIURIL) 25 MG tablet Take 25 mg by mouth daily.    [provider]  HYDROcodone-acetaminophen (NORCO) 5-325 MG tablet Take 1-2 tablets every 6 (six) hours as needed by mouth for moderate pain. 05/02/17   Leonie Green, MD  ibuprofen (ADVIL,MOTRIN) 200 MG tablet Take 400 mg by mouth every 8 (eight) hours as needed for headache or mild pain.    [provider]  levothyroxine (SYNTHROID, LEVOTHROID) 150 MCG tablet Take 150 mcg by mouth daily before breakfast.    [provider]  losartan (COZAAR) 25 MG tablet Take 25 mg by mouth daily.    [provider]    Physical Exam: Vitals:   02/20/21 1331 02/20/21 1332 02/20/21 1532 02/20/21 1642  BP: (!) 147/99  134/79 (!) 152/98  Pulse: 73   79  Resp: 20   19  Temp: 98.2 F (36.8 C)   98.5 F (36.9 C)  TempSrc: Oral   Oral  SpO2: 100%   100%  Weight:  126.6 kg    Height:  '6\' 4"'$  (1.93 m)      Constitutional: NAD, calm, comfortable Vitals:   02/20/21 1331 02/20/21 1332 02/20/21 1532 02/20/21 1642  BP: (!) 147/99  134/79 (!) 152/98  Pulse: 73   79  Resp: 20   19  Temp: 98.2 F (36.8 C)   98.5 F (36.9 C)  TempSrc: Oral   Oral  SpO2: 100%   100%  Weight:  126.6 kg    Height:  '6\' 4"'$  (1.93 m)     Eyes: PERRL, lids and conjunctivae normal ENMT: Mucous membranes are moist. Posterior pharynx clear of any exudate or lesions.Normal dentition.  Neck: normal, supple, no masses, no  thyromegaly Respiratory: clear to auscultation bilaterally, no wheezing, no crackles. Normal respiratory effort. No accessory muscle use.  Cardiovascular: Regular rate and rhythm, no murmurs / rubs / gallops.  Trace bilateral extremity edema. 2+ pedal pulses.   Abdomen: Tender left upper quadrant no rebound or guarding.  Bowel sounds positive.  Musculoskeletal: no clubbing / cyanosis. No joint deformity upper and lower extremities. Good ROM, no contractures. Normal muscle tone.  Skin: no rashes, lesions, ulcers. No induration Neurologic: CN 2-12 grossly intact. Sensation intact, Strength 5/5 in all 4.  Psychiatric: Normal judgment and insight. Alert and oriented x 3. Normal mood.     Labs on Admission: I have personally reviewed following labs and imaging studies  CBC: Recent Labs  Lab  02/20/21 1335  WBC 7.5  HGB 7.0*  HCT 23.3*  MCV 72.6*  PLT [id number]    Basic Metabolic Panel: Recent Labs  Lab 02/20/21 1335  NA 136  K 3.6  CL 97*  CO2 29  GLUCOSE 111*  BUN 20  CREATININE 1.23  CALCIUM 11.7*    GFR: Estimated Creatinine Clearance: 93.9 mL/min (by C-G formula based on SCr of 1.23 mg/dL).  Liver Function Tests: Recent Labs  Lab 02/20/21 1335  AST 28  ALT 30  ALKPHOS 63  BILITOT 0.3  PROT 7.2  ALBUMIN 3.2*    Urine analysis:    Component Value Date/Time   COLORURINE YELLOW (A) 02/20/2021 1629   APPEARANCEUR CLEAR (A) 02/20/2021 1629   LABSPEC 1.010 02/20/2021 1629   PHURINE 5.5 02/20/2021 1629   GLUCOSEU NEGATIVE 02/20/2021 1629   HGBUR NEGATIVE 02/20/2021 1629   [place] NEGATIVE 02/20/2021 1629   KETONESUR NEGATIVE 02/20/2021 1629   PROTEINUR NEGATIVE 02/20/2021 1629   NITRITE NEGATIVE 02/20/2021 1629   LEUKOCYTESUR NEGATIVE 02/20/2021 1629    Radiological Exams on Admission: CT ABDOMEN PELVIS W CONTRAST  Result Date: 02/20/2021 CLINICAL DATA:  Bowel obstruction suspected EXAM: CT ABDOMEN AND PELVIS WITH CONTRAST TECHNIQUE: Multidetector CT imaging  of the abdomen and pelvis was performed using the standard protocol following bolus administration of intravenous contrast. CONTRAST:  165m OMNIPAQUE IOHEXOL 350 MG/ML SOLN COMPARISON:  None. FINDINGS: Lower chest: Trace left pleural effusion. Hepatobiliary: No focal liver abnormality is seen. No gallstones, gallbladder wall thickening, or biliary dilatation. Pancreas: Pancreatic atrophy. Tail of the pancreas is located adjacent to a large left upper quadrant mass which is described below with no intervening fat plane. Spleen/Stomach: Large heterogeneous mass of the left upper quadrant centered at the body of the stomach and spleen and involving the splenic artery and vein and tail of the pancreas. Mass measures approximately 12.0 x 11.4 cm in axial dimension. Air seen within the mass, likely due to fistulization with the stomach and/or necrosis. Small and large bowel: Normal in caliber with no evidence of wall thickening or obstruction. Normal appendix. Numerous colonic diverticula, most pronounced in the sigmoid colon. Adrenals/Urinary Tract: Adrenal glands are unremarkable. Kidneys are normal, without renal calculi, focal lesion, or hydronephrosis. Bladder is unremarkable. Vascular/Lymphatic: Enlarged periportal lymph nodes. Reference periportal lymph node measuring 1.4 cm in short axis located on series 2, image 23. Aortic atherosclerosis Reproductive: Prostate is unremarkable. Other: No abdominal wall hernia or abnormality. Trace free fluid in the pelvis. Musculoskeletal: No acute or significant osseous findings. IMPRESSION: Large heterogeneous mass of the left upper quadrant with central cavitation centered at the body of the stomach and spleen and involving the splenic artery, splenic vein and tail of the pancreas. Favor gastric or splenic primary neoplasm. Enlarged periportal lymph nodes, concerning for nodal metastatic disease. Trace left pleural effusion and trace free fluid in the pelvis. Aortic  Atherosclerosis (ICD10-I70.0). Electronically Signed   By: LYetta GlassmanM.D.   On: 02/20/2021 16:21    Assessment/Plan Active Problems:   Gastric mass   Weight loss   Acute on chronic anemia   Diabetes mellitus type II, controlled (HCC)   Nausea in adult   Constipation   Essential hypertension   Hypothyroidism 60year old male with a history of diabetes, hypertension, hypothyroidism presents with acute on chronic anemia, known iron deficient, 30 pound weight loss with now gastric/splenic mass concerning for malignancy.  Patient given Protonix bolus in the ED.  We will continue drip at this time.  Acute on chronic anemia known iron deficiency now with concerning malignancy Type and screen GI and oncology consult Surgery consult  Splenic/gastric mass 12 x 11-follow-up surgery consult-no surgical intervention at this time  Potential GI bleed-we will continue Protonix twice daily per GI recommendations Dr. Annalee Genta.  Diabetes sliding scale insulin follow-up blood sugar checks we will give D5 normal saline Hypoglycemia protocol  Hypertension-holding meds  Nausea vomiting secondary to above we will give as needed Zofran  Hypothyroidism continue home Synthroid  Disposition-admit patient, stepdown Follow-up surgical consult for GI consult Oncology consult Discussed with GI recommends getting 1 unit of PRBCs now, ED doc ordering. DVT prophylaxis: SCDs Code Status:   full Family Communication:  Wife at bedside Ms. Lewis Stuller who can be reached at [phone] Disposition Plan:   Patient is from:  Home  Anticipated DC to:  Home  Anticipated DC date:  To be determined  Anticipated DC barriers: Further work-up of gastric mass  Consults called:  Surgery-Dr. Dahlia Byes Admission status:  Stepdown  Severity of Illness: The appropriate patient status for this patient is INPATIENT. Inpatient status is judged to be reasonable and necessary in order to provide the required intensity of  service to ensure the patient's safety. The patient's presenting symptoms, physical exam findings, and initial radiographic and laboratory data in the context of their chronic comorbidities is felt to place them at high risk for further clinical deterioration. Furthermore, it is not anticipated that the patient will be medically stable for discharge from the hospital within 2 midnights of admission. The following factors support the patient status of inpatient.   " The patient's presenting symptoms include as above. " The worrisome physical exam findings include as above. " The initial radiographic and laboratory data are worrisome because of as above. " The chronic co-morbidities include as above.   * I certify that at the point of admission it is my clinical judgment that the patient will require inpatient hospital care spanning beyond 2 midnights from the point of admission due to high intensity of service, high risk for further deterioration and high frequency of surveillance required.Jacqlyn Krauss MD [facility] [phone] How to contact the [facility] Attending or Consulting provider [place] or covering provider during after hours [place], for this patient?   Check the care team in [facility] and look for a) attending/consulting [facility] provider listed and b) the [facility] team listed Log into [email or web address] and use [place] universal password to access. If you do not have the password, please contact the hospital operator. Locate the [facility] provider you are looking for under [facility] and page to a number that you can be directly reached. If you still have difficulty reaching the provider, please page the [facility] (Director on Call) for the Hospitalists listed on amion for assistance.  02/20/2021, 5:31 PM

## 2021-02-20 NOTE — ED Notes (Signed)
prbc's complete.  No reaction noted.  Pt alert.

## 2021-02-20 NOTE — ED Notes (Signed)
1st unit prbc started.  Family with pt.  Pt alert.

## 2021-02-20 NOTE — ED Triage Notes (Signed)
Pt reports that he is having Left side abd pain. He has not been able to have a good BM for the last couple of weeks. Stool softeners, metamucil and suppositories and has had small amount of success. He has had N/V and feels that when he belches it taste like road kill

## 2021-02-20 NOTE — ED Notes (Signed)
meds given for headache and nausea.  Pt alert.  Family with pt

## 2021-02-20 NOTE — Consult Note (Signed)
[facility] Inpatient Consultation   Patient ID: Zachary Perkins is a 60 y.o. male.  Requesting Provider: Meda Klinefelter, MD  Date of Admission: 02/20/2021  Date of Consult: 02/20/21   Reason for Consultation: Gastric Mass   Patient's Chief Complaint:   Chief Complaint  Patient presents with   Abdominal Pain   Nausea   Emesis   Constipation    60 year old Caucasian male with diabetes, hypertension, obesity, hypothyroidism and previous tobacco dependence who presents to the hospital with worsening abdominal pain constipation and nausea.  Gastroenterology is consulted for gastric mass found on imaging.  Patient notes symptoms of weakness and lack of energy with increasing fatigue began after being diagnosed with COVID in July.  Over the last month he has continued to find worsening fatigue.  He was seen by his PCP just prior to being infected by COVID-19 he was noted to be iron deficient and started on supplementation.  He had ongoing constipation for which she was then taking Metamucil MiraLAX stool softeners and suppositories which produced dark and soft stool.  Through this time he has felt stabbing discomfort in his left upper quadrant with nausea but no vomiting.  He notes poor appetite and early satiety becoming full after just a few bites.  Over the last 2 months he reports a 30 pound weight loss.  Previous hemoglobin was noted to be 13.8 then descended to 10.2 and is now 7.5 today.  This appears to be a slow decline as patient is alert and participatory in exam.  BUN/creatinine is 20:1.23 respectively.  Patient denies vomiting, coffee ground emesis, hematemesis, constipation, hematochezia, fever, chills, dysphagia, odynophagia, jaundice, heartburn/reflux.  He had not undergone previous imaging or endoscopy.  CT was performed demonstrating a 12.4 x 11.4 cm mass in the gastric body with question of air infiltration from potential fistulization versus necrosis.  There is  question of involvement of the tail of the pancreas in addition to splenic vein and artery. Nodal enlargement concerning for metastases.  He has been evaluated by surgery with no acute/emergent intervention at this time. Previous tobacco history. Denies NSAIDs, Anti-plt agents, and anticoagulants Denies family history of gastrointestinal disease and malignancy Previous Endoscopies: None  Past Medical History:  Diagnosis Date   Diabetes mellitus without complication ([place])    Hypertension    Hypothyroidism    Morbid obesity ([place])     Past Surgical History:  Procedure Laterality Date   CARPAL TUNNEL RELEASE Left    INSERTION OF MESH N/A 05/02/2017   Procedure: INSERTION OF MESH;  Surgeon: Leonie Green, MD;  Location: [facility] ORS;  Service: General;  Laterality: N/A;   UMBILICAL HERNIA REPAIR N/A 05/02/2017   Procedure: HERNIA REPAIR UMBILICAL ADULT;  Surgeon: Leonie Green, MD;  Location: [facility] ORS;  Service: General;  Laterality: N/A;    Allergies  Allergen Reactions   Lisinopril Cough   Metformin And Related     PASSED OUT    No family history on file.  Social History   Tobacco Use   Smoking status: Former    Packs/day: 1.00    Years: 20.00    Pack years: 20.00    Types: Cigarettes    Quit date: 04/25/2007    Years since quitting: 13.8   Smokeless tobacco: Never  Vaping Use   Vaping Use: Never used  Substance Use Topics   Alcohol use: Yes    Comment: rare   Drug use: No     Pertinent GI related history and  allergies were reviewed with the patient  Review of Systems  Constitutional:  Positive for activity change, appetite change (diminished), fatigue and unexpected weight change (lost 30 lbs in 2 months). Negative for chills and fever.  HENT:  Negative for trouble swallowing and voice change.   Respiratory:  Positive for shortness of breath (on exertion). Negative for wheezing.   Cardiovascular:  Negative for chest pain and palpitations.   Gastrointestinal:  Positive for abdominal pain, blood in stool (? of dark stools; on iron), constipation and nausea. Negative for abdominal distention, anal bleeding, diarrhea and vomiting.  Musculoskeletal:  Negative for arthralgias and myalgias.  Skin:  Negative for color change and pallor.  Neurological:  Positive for weakness. Negative for dizziness, syncope and light-headedness.  Psychiatric/Behavioral:  Negative for agitation and confusion. The patient is not nervous/anxious.   All other systems reviewed and are negative.   Medications Home Medications No current facility-administered medications on file prior to encounter.   Current Outpatient Medications on File Prior to Encounter  Medication Sig Dispense Refill   aspirin EC 81 MG tablet Take 81 mg by mouth daily.     Ginkgo Biloba (GNP GINGKO BILOBA EXTRACT PO) Take 40 mg by mouth daily.     hydrochlorothiazide (HYDRODIURIL) 25 MG tablet Take 25 mg by mouth daily.     HYDROcodone-acetaminophen (NORCO) 5-325 MG tablet Take 1-2 tablets every 6 (six) hours as needed by mouth for moderate pain. 12 tablet 0   ibuprofen (ADVIL,MOTRIN) 200 MG tablet Take 400 mg by mouth every 8 (eight) hours as needed for headache or mild pain.     levothyroxine (SYNTHROID, LEVOTHROID) 150 MCG tablet Take 150 mcg by mouth daily before breakfast.     losartan (COZAAR) 25 MG tablet Take 25 mg by mouth daily.     Pertinent GI related medications were reviewed with the patient  Inpatient Medications  Current Facility-Administered Medications:    pantoprazole (PROTONIX) 80 mg /NS 100 mL IVPB, 80 mg, Intravenous, Once, Rada Hay, MD, Last Rate: 300 mL/hr at 02/20/21 1744, 80 mg at 02/20/21 1744  Current Outpatient Medications:    aspirin EC 81 MG tablet, Take 81 mg by mouth daily., Disp: , Rfl:    Ginkgo Biloba (GNP GINGKO BILOBA EXTRACT PO), Take 40 mg by mouth daily., Disp: , Rfl:    hydrochlorothiazide (HYDRODIURIL) 25 MG tablet, Take 25 mg  by mouth daily., Disp: , Rfl:    HYDROcodone-acetaminophen (NORCO) 5-325 MG tablet, Take 1-2 tablets every 6 (six) hours as needed by mouth for moderate pain., Disp: 12 tablet, Rfl: 0   ibuprofen (ADVIL,MOTRIN) 200 MG tablet, Take 400 mg by mouth every 8 (eight) hours as needed for headache or mild pain., Disp: , Rfl:    levothyroxine (SYNTHROID, LEVOTHROID) 150 MCG tablet, Take 150 mcg by mouth daily before breakfast., Disp: , Rfl:    losartan (COZAAR) 25 MG tablet, Take 25 mg by mouth daily., Disp: , Rfl:   pantoprazole (PROTONIX) IV 80 mg (02/20/21 1744)       Objective   Vitals:   02/20/21 1331 02/20/21 1332 02/20/21 1532 02/20/21 1642  BP: (!) 147/99  134/79 (!) 152/98  Pulse: 73   79  Resp: 20   19  Temp: 98.2 F (36.8 C)   98.5 F (36.9 C)  TempSrc: Oral   Oral  SpO2: 100%   100%  Weight:  126.6 kg    Height:  '6\' 4"'$  (1.93 m)      Physical Exam Vitals  and nursing note reviewed.  Constitutional:      General: He is not in acute distress.    Appearance: Normal appearance. He is obese. He is not ill-appearing, toxic-appearing or diaphoretic.  HENT:     Head: Normocephalic and atraumatic.     Nose: Nose normal.     Mouth/Throat:     Mouth: Mucous membranes are moist.     Pharynx: Oropharynx is clear.  Eyes:     General: No scleral icterus.    Extraocular Movements: Extraocular movements intact.  Cardiovascular:     Rate and Rhythm: Normal rate and regular rhythm.     Heart sounds: Normal heart sounds. No murmur heard.   No friction rub. No gallop.  Pulmonary:     Effort: Pulmonary effort is normal. No respiratory distress.     Breath sounds: Normal breath sounds. No wheezing, rhonchi or rales.  Abdominal:     General: Abdomen is flat. Bowel sounds are normal. There is no distension.     Palpations: Abdomen is soft.     Tenderness: There is no abdominal tenderness. There is no guarding or rebound.  Musculoskeletal:     Cervical back: Neck supple.     Right lower  leg: No edema.     Left lower leg: No edema.  Skin:    General: Skin is warm and dry.     Coloration: Skin is pale. Skin is not jaundiced.  Neurological:     General: No focal deficit present.     Mental Status: He is alert and oriented to person, place, and time. Mental status is at baseline.  Psychiatric:        Mood and Affect: Mood normal.        Behavior: Behavior normal.        Thought Content: Thought content normal.        Judgment: Judgment normal.    Laboratory Data Recent Labs  Lab 02/20/21 1335  WBC 7.5  HGB 7.0*  HCT 23.3*  PLT 325   Recent Labs  Lab 02/20/21 1335  NA 136  K 3.6  CL 97*  CO2 29  BUN 20  CALCIUM 11.7*  PROT 7.2  BILITOT 0.3  ALKPHOS 63  ALT 30  AST 28  GLUCOSE 111*    Recent Labs    02/20/21 1335  LIPASE 18         Imaging studies: CT ABDOMEN PELVIS W CONTRAST  Result Date: 02/20/2021 CLINICAL DATA:  Bowel obstruction suspected EXAM: CT ABDOMEN AND PELVIS WITH CONTRAST TECHNIQUE: Multidetector CT imaging of the abdomen and pelvis was performed using the standard protocol following bolus administration of intravenous contrast. CONTRAST:  154m OMNIPAQUE IOHEXOL 350 MG/ML SOLN COMPARISON:  None. FINDINGS: Lower chest: Trace left pleural effusion. Hepatobiliary: No focal liver abnormality is seen. No gallstones, gallbladder wall thickening, or biliary dilatation. Pancreas: Pancreatic atrophy. Tail of the pancreas is located adjacent to a large left upper quadrant mass which is described below with no intervening fat plane. Spleen/Stomach: Large heterogeneous mass of the left upper quadrant centered at the body of the stomach and spleen and involving the splenic artery and vein and tail of the pancreas. Mass measures approximately 12.0 x 11.4 cm in axial dimension. Air seen within the mass, likely due to fistulization with the stomach and/or necrosis. Small and large bowel: Normal in caliber with no evidence of wall thickening or  obstruction. Normal appendix. Numerous colonic diverticula, most pronounced in the sigmoid colon. Adrenals/Urinary Tract: Adrenal  glands are unremarkable. Kidneys are normal, without renal calculi, focal lesion, or hydronephrosis. Bladder is unremarkable. Vascular/Lymphatic: Enlarged periportal lymph nodes. Reference periportal lymph node measuring 1.4 cm in short axis located on series 2, image 23. Aortic atherosclerosis Reproductive: Prostate is unremarkable. Other: No abdominal wall hernia or abnormality. Trace free fluid in the pelvis. Musculoskeletal: No acute or significant osseous findings. IMPRESSION: Large heterogeneous mass of the left upper quadrant with central cavitation centered at the body of the stomach and spleen and involving the splenic artery, splenic vein and tail of the pancreas. Favor gastric or splenic primary neoplasm. Enlarged periportal lymph nodes, concerning for nodal metastatic disease. Trace left pleural effusion and trace free fluid in the pelvis. Aortic Atherosclerosis (ICD10-I70.0). Electronically Signed   By: Yetta Glassman M.D.   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  - 12.4x11.4 cm  - potential necrosis vs fistulization  - suspect nodal involvement  - splenic vein/artery involvement and distal tail of pancreas # abnormal weight loss- likely 2/2 above  - 30lbs in 2 months; early satiety # abdominal pain 2/2 above # potential melena  - on iron- dark stools, hgb decreasing  - bun/cr 20/1.23 # Anemia- suspected 2/2 blood loss  - hgb 7.5 today from 10.2 and 13.8 over the last year  - iron deficient # nausea without vomiting # constipation # worsening fatigue and generalized weakness # dyspnea on exertion # diverticulosis  - noted on imaging # obesity # dm2 # htn  Plan:  EGD with possible intervention tomorrow pending Endoscopy Room availability NPO at midnight. Sips and ice chips ok up to that  point Recommend 40 mg iv bid protonix Given hgb 7.5, recommend optimization with 1 u prbc now before anesthesia tomorrow Monitor H&H q12h. transfusion and resuscitation as per primary team Supportive care including antiemetics and pain control as per primary team Labs and imaging reviewed and interpreted by me Discussed case with hospitalist team.  Reviewed surgical consultation note CBC, BMP, INR in am If patient develops hematochezia/hematemesis overnight recommend urgent IR/surgical consultation as may have developed bleeding from fistula with vessel which endoscopy would not be able to stop  Esophagogastroduodenoscopy with possible biopsy, control of bleeding, polypectomy, and interventions as necessary has been discussed with the patient/patient representative. Informed consent was obtained from the patient/patient representative after explaining the indication, nature, and risks of the procedure including but not limited to death, bleeding, perforation, missed neoplasm/lesions, cardiorespiratory compromise, and reaction to medications. Opportunity for questions was given and appropriate answers were provided. Patient/patient representative has verbalized understanding is amenable to undergoing the procedure.  I personally performed the service.  Management of other medical comorbidities as per primary team  Thank you for allowing [place] to participate in this patient's care. Please don't hesitate to call if any questions or concerns arise.   Annamaria Helling, DO [facility]  Portions of the record may have been created with voice recognition software. Occasional wrong-word or 'sound-a-like' substitutions may have occurred due to the inherent limitations of voice recognition software.  Read the chart carefully and recognize, using context, where substitutions may have occurred.

## 2021-02-20 NOTE — ED Notes (Signed)
Pt states feeling better now.  Family with pt.

## 2021-02-20 NOTE — ED Provider Notes (Addendum)
[facility]  ____________________________________________   Event Date/Time   First MD Initiated Contact with Patient 02/20/21 1429     (approximate)  I have reviewed the triage vital signs and the nursing notes.   HISTORY  Chief Complaint Abdominal Pain, Nausea, Emesis, and Constipation    HPI Darrly Eckenroth is a 60 y.o. male with past medical history of diabetes hypertension and hypothyroidism who presents with abdominal pain and constipation.  Symptoms have been going on for about a week.  His last normal bowel movement was about 2 weeks ago.  Normally he has several bowel movements per day.  He notes that he has tried Metamucil and over-the-counter stool softeners but is only getting a small amount of liquid stool.  He endorses pain in the left upper quadrant as well as generalized abdominal pain.  He denies vomiting but is having nausea and notes that when he belches it has a abdomen normally bad taste. He had a 100 at home.         Past Medical History:  Diagnosis Date   Diabetes mellitus without complication ([place])    Hypertension    Hypothyroidism    Morbid obesity ([place])     There are no problems to display for this patient.   Past Surgical History:  Procedure Laterality Date   CARPAL TUNNEL RELEASE Left    INSERTION OF MESH N/A 05/02/2017   Procedure: INSERTION OF MESH;  Surgeon: Leonie Green, MD;  Location: [facility] ORS;  Service: General;  Laterality: N/A;   UMBILICAL HERNIA REPAIR N/A 05/02/2017   Procedure: HERNIA REPAIR UMBILICAL ADULT;  Surgeon: Leonie Green, MD;  Location: [facility] ORS;  Service: General;  Laterality: N/A;    Prior to Admission medications   Medication Sig Start Date End Date Taking? Authorizing Provider  aspirin EC 81 MG tablet Take 81 mg by mouth daily.    [provider]  Ginkgo Biloba (GNP GINGKO BILOBA EXTRACT PO) Take 40 mg by mouth daily.    [provider]  hydrochlorothiazide  (HYDRODIURIL) 25 MG tablet Take 25 mg by mouth daily.    [provider]  HYDROcodone-acetaminophen (NORCO) 5-325 MG tablet Take 1-2 tablets every 6 (six) hours as needed by mouth for moderate pain. 05/02/17   Leonie Green, MD  ibuprofen (ADVIL,MOTRIN) 200 MG tablet Take 400 mg by mouth every 8 (eight) hours as needed for headache or mild pain.    [provider]  levothyroxine (SYNTHROID, LEVOTHROID) 150 MCG tablet Take 150 mcg by mouth daily before breakfast.    [provider]  losartan (COZAAR) 25 MG tablet Take 25 mg by mouth daily.    [provider]    Allergies Lisinopril and Metformin and related  No family history on file.  Social History Social History   Tobacco Use   Smoking status: Former    Packs/day: 1.00    Years: 20.00    Pack years: 20.00    Types: Cigarettes    Quit date: 04/25/2007    Years since quitting: 13.8   Smokeless tobacco: Never  Vaping Use   Vaping Use: Never used  Substance Use Topics   Alcohol use: Yes    Comment: rare   Drug use: No    Review of Systems   Review of Systems  Constitutional:  Positive for fever and unexpected weight change. Negative for chills.  Respiratory:  Negative for shortness of breath.   Cardiovascular:  Negative for chest pain.  Gastrointestinal:  Positive for abdominal distention, abdominal pain, constipation and nausea. Negative for blood in stool and vomiting.  Genitourinary:  Negative for dysuria.  All other systems reviewed and are negative.  Physical Exam Updated Vital Signs BP 134/79 (BP Location: Right Arm)   Pulse 73   Temp 98.2 F (36.8 C) (Oral)   Resp 20   Ht '6\' 4"'$  (1.93 m)   Wt 126.6 kg   SpO2 100%   BMI 33.96 kg/m   Physical Exam Vitals and nursing note reviewed.  Constitutional:      General: He is not in acute distress.    Appearance: Normal appearance.  HENT:     Head: Normocephalic and atraumatic.  Eyes:     General: No scleral icterus.     Conjunctiva/sclera: Conjunctivae normal.  Pulmonary:     Effort: Pulmonary effort is normal. No respiratory distress.     Breath sounds: Normal breath sounds. No wheezing.  Abdominal:     General: Abdomen is flat.     Palpations: Abdomen is soft.     Tenderness: There is abdominal tenderness.     Comments: Abdomen is soft, tenderness to palpation epigastrium and left upper quadrant  Musculoskeletal:        General: No deformity or signs of injury.     Cervical back: Normal range of motion.  Skin:    Coloration: Skin is pale. Skin is not jaundiced.  Neurological:     General: No focal deficit present.     Mental Status: He is alert and oriented to person, place, and time. Mental status is at baseline.  Psychiatric:        Mood and Affect: Mood normal.        Behavior: Behavior normal.     LABS (all labs ordered are listed, but only abnormal results are displayed)  Labs Reviewed  COMPREHENSIVE METABOLIC PANEL - Abnormal; Notable for the following components:      Result Value   Chloride 97 (*)    Glucose, Bld 111 (*)    Calcium 11.7 (*)    Albumin 3.2 (*)    All other components within normal limits  CBC - Abnormal; Notable for the following components:   RBC 3.21 (*)    Hemoglobin 7.0 (*)    HCT 23.3 (*)    MCV 72.6 (*)    MCH 21.8 (*)    RDW 16.1 (*)    All other components within normal limits  IRON AND TIBC - Abnormal; Notable for the following components:   Iron 16 (*)    Saturation Ratios 6 (*)    All other components within normal limits  LIPASE, BLOOD  FERRITIN  URINALYSIS, COMPLETE (UACMP) WITH MICROSCOPIC  OCCULT BLOOD X 1 CARD TO LAB, STOOL  TYPE AND SCREEN   ____________________________________________  EKG  N/a ____________________________________________  RADIOLOGY Almeta Monas, personally viewed and evaluated these images (plain radiographs) as part of my medical decision making, as well as reviewing the written report by the  radiologist.  ED MD interpretation: I reviewed the CT of the abdomen and pelvis and the radiology read which is concerning for large mass involving the stomach and spleen    ____________________________________________   PROCEDURES  Procedure(s) performed (including Critical Care):  Procedures   ____________________________________________   INITIAL IMPRESSION / ASSESSMENT AND PLAN / ED COURSE     60 year old male presenting with constipation left upper abdominal pain.  His vital signs are within normal limits.  He is  notably anemic to hemoglobin of 7, was about 13 2 months ago.  He also has unintentional weight loss of 30 pounds.  His abdominal exam overall reassuring, soft but he is tender in the epigastrium and left upper quadrant.  CT abdomen pelvis was obtained which unfortunately shows a large heterogeneous mass that involves both the stomach and the spleen involving the splenic artery and vein.  In further discussion with the patient he has had some black stools which she attributed to the iron supplementation.  With his new anemia and this large mass I do feel like he should be admitted to have expedited work-up.  Will give Protonix and send type and screen.  No indication for transfusion at this time.   I spoke with the hospitalist who is taking on the patient he was concerned about the potential for hemorrhage secondary to the proximity of the mass to his splenic artery and vein.  This consulted surgery and Dr. Perrin Maltese evaluated the patient recommending that we involve GI as he will need a scope.  GI recommended EGD tomorrow Protonix as well as a transfusion in preparation for the EGD.  I consented the patient for transfusion.  Hospitalist also asked that I speak with IR.  I spoke with Dr. Laurence Ferrari from IR who notes that if the patient were to significantly open up that this would likely need to be dealt with at [facility] because IR here does not have the capability to do these  procedures and vascular would likely not be able to do this type of procedure.  He also expressed that it is unlikely that he would spontaneously hemorrhage if there is no pseudoaneurysm on CT and that he is likely just having a slow bleed.  This is fitting with the fact that he is hemodynamically stable and is not describing any new change in his stool.  I discussed this again with the hospitalist and recommended that if the patient did indeed become unstable that he would likely need to be intervened on by surgery.     ____________________________________________   FINAL CLINICAL IMPRESSION(S) / ED DIAGNOSES  Final diagnoses:  Anemia, unspecified type  Gastric mass     ED Discharge Orders     None        Note:  This document was prepared using Dragon voice recognition software and may include unintentional dictation errors.    Rada Hay, MD 02/20/21 1639    Rada Hay, MD 02/20/21 Despina Pole

## 2021-02-20 NOTE — Consult Note (Signed)
Patient ID: Zachary Perkins, male   DOB: 03/01/1961, 60 y.o.   MRN: [id number]  HPI Zachary Perkins is a 60 y.o. male seen in consultation at the request of [place].  He came into the emergency room complaining of nominal pain and constipation.  He reports that the pain was located in the left upper quadrant and epigastric region sharp moderate to severe intensity.  He also felt that he is constipated his last bowel movement that was normal was a couple weeks ago.  He feels Bloated and at times nauseated.  Reports some dark-colored stool for the last few months but he has also been taking iron. Of note he has significant dyspnea on exertion over the last few months and has lost about 30 pounds in the last month or so.  His hemoglobin was 7 today with a normal CMP except High Calcium and CBC. There is a drop of hb of 3 gms over the last month. Only prior surgery [facility] in 2018 by Dr. Tamala Julian.CT pers. 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 No hemoperitoneum no blush    HPI  Past Medical History:  Diagnosis Date   Diabetes mellitus without complication ([place])    Hypertension    Hypothyroidism    Morbid obesity ([place])     Past Surgical History:  Procedure Laterality Date   CARPAL TUNNEL RELEASE Left    INSERTION OF MESH N/A 05/02/2017   Procedure: INSERTION OF MESH;  Surgeon: Leonie Green, MD;  Location: [facility] ORS;  Service: General;  Laterality: N/A;   UMBILICAL HERNIA REPAIR N/A 05/02/2017   Procedure: HERNIA REPAIR UMBILICAL ADULT;  Surgeon: Leonie Green, MD;  Location: [facility] ORS;  Service: General;  Laterality: N/A;    No pertinent family history  Social History Social History   Tobacco Use   Smoking status: Former    Packs/day: 1.00    Years: 20.00    Pack years: 20.00    Types: Cigarettes    Quit date: 04/25/2007    Years since quitting: 13.8   Smokeless tobacco: Never  Vaping Use   Vaping Use: Never used  Substance Use  Topics   Alcohol use: Yes    Comment: rare   Drug use: No    Allergies  Allergen Reactions   Lisinopril Cough   Metformin And Related     PASSED OUT    Current Facility-Administered Medications  Medication Dose Route Frequency Provider Last Rate Last Admin   pantoprazole (PROTONIX) 80 mg /NS 100 mL IVPB  80 mg Intravenous Once Rada Hay, MD 300 mL/hr at 02/20/21 1744 80 mg at 02/20/21 1744   Current Outpatient Medications  Medication Sig Dispense Refill   aspirin EC 81 MG tablet Take 81 mg by mouth daily.     Ginkgo Biloba (GNP GINGKO BILOBA EXTRACT PO) Take 40 mg by mouth daily.     hydrochlorothiazide (HYDRODIURIL) 25 MG tablet Take 25 mg by mouth daily.     HYDROcodone-acetaminophen (NORCO) 5-325 MG tablet Take 1-2 tablets every 6 (six) hours as needed by mouth for moderate pain. 12 tablet 0   ibuprofen (ADVIL,MOTRIN) 200 MG tablet Take 400 mg by mouth every 8 (eight) hours as needed for headache or mild pain.     levothyroxine (SYNTHROID, LEVOTHROID) 150 MCG tablet Take 150 mcg by mouth daily before breakfast.     losartan (COZAAR) 25 MG tablet Take 25 mg by mouth daily.  Review of Systems Full ROS  was asked and was negative except for the information on the HPI  Physical Exam Blood pressure (!) 152/98, pulse 79, temperature 98.5 F (36.9 C), temperature source Oral, resp. rate 19, height '6\' 4"'$  (1.93 m), weight 126.6 kg, SpO2 100 %. CONSTITUTIONAL: NAD EYES: Pupils are equal, round,  Sclera are non-icteric. EARS, NOSE, MOUTH AND THROAT: The oropharynx is clear. The oral mucosa is pink and moist. Hearing is intact to voice. LYMPH NODES:  Lymph nodes in the neck are normal. RESPIRATORY:  Lungs are clear. There is normal respiratory effort, with equal breath sounds bilaterally, and without pathologic use of accessory muscles. CARDIOVASCULAR: Heart is regular without murmurs, gallops, or rubs. GI: The abdomen is soft, nontender, and nondistended. There are no  palpable masses. There is no hepatosplenomegaly. There are normal bowel sounds GU: Rectal deferred.   MUSCULOSKELETAL: Normal muscle strength and tone. No cyanosis or edema.   SKIN: Turgor is good and there are no pathologic skin lesions or ulcers. NEUROLOGIC: Motor and sensation is grossly normal. Cranial nerves are grossly intact. PSYCH:  Oriented to person, place and time. Affect is normal.  Data Reviewed I have personally reviewed the patient's imaging, laboratory findings and medical records.    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 Currently there is no evidence of hemoperitoneum or any evidence of active bleed.  He will definitely needs to have an endoscopy to further evaluate this potential gastric mass.  After this biopsy and further staging will be required.  At this time I will favor neoplasm.  Question will be whether or not might be resectable and likely will benefit from potential neoadjuvant therapy after confirming diagnosis.  In the interim from an acute general surgery perspective there is no need for any emergent surgical invention at this time.  We will continue to follow.  Discussed in detail with hospitalist and ER physician.   Caroleen Hamman, MD FACS General Surgeon 02/20/2021, 5:50 PM

## 2021-02-21 ENCOUNTER — Encounter: Payer: Self-pay | Admitting: Internal Medicine

## 2021-02-21 ENCOUNTER — Inpatient Hospital Stay: Payer: BC Managed Care – PPO | Admitting: Anesthesiology

## 2021-02-21 ENCOUNTER — Encounter
Admission: EM | Disposition: A | Discharge status: Home / Self Care | Payer: Self-pay | Source: Home / Self Care | Attending: Internal Medicine

## 2021-02-21 DIAGNOSIS — R634 Abnormal weight loss: Secondary | ICD-10-CM

## 2021-02-21 DIAGNOSIS — K3189 Other diseases of stomach and duodenum: Secondary | ICD-10-CM | POA: Diagnosis not present

## 2021-02-21 DIAGNOSIS — D500 Iron deficiency anemia secondary to blood loss (chronic): Secondary | ICD-10-CM | POA: Diagnosis not present

## 2021-02-21 DIAGNOSIS — I10 Essential (primary) hypertension: Secondary | ICD-10-CM

## 2021-02-21 HISTORY — PX: ESOPHAGOGASTRODUODENOSCOPY (EGD) WITH PROPOFOL: SHX5813

## 2021-02-21 LAB — CBG MONITORING, ED: Glucose-Capillary: 127 mg/dL — ABNORMAL HIGH (ref 70–99)

## 2021-02-21 LAB — COMPREHENSIVE METABOLIC PANEL
ALT: 23 U/L (ref 0–44)
AST: 20 U/L (ref 15–41)
Albumin: 3 g/dL — ABNORMAL LOW (ref 3.5–5.0)
Alkaline Phosphatase: 56 U/L (ref 38–126)
Anion gap: 6 (ref 5–15)
BUN: 19 mg/dL (ref 6–20)
CO2: 31 mmol/L (ref 22–32)
Calcium: 10.9 mg/dL — ABNORMAL HIGH (ref 8.9–10.3)
Chloride: 99 mmol/L (ref 98–111)
Creatinine, Ser: 1.3 mg/dL — ABNORMAL HIGH (ref 0.61–1.24)
GFR, Estimated: >60 mL/min (ref >60)
Glucose, Bld: 119 mg/dL — ABNORMAL HIGH (ref 70–99)
Potassium: 4 mmol/L (ref 3.5–5.1)
Sodium: 136 mmol/L (ref 135–145)
Total Bilirubin: 0.5 mg/dL (ref 0.3–1.2)
Total Protein: 6.7 g/dL (ref 6.5–8.1)

## 2021-02-21 LAB — CBC WITH DIFFERENTIAL/PLATELET
Abs Immature Granulocytes: 0.03 10*3/uL (ref 0.00–0.07)
Basophils Absolute: 0 10*3/uL (ref 0.0–0.1)
Basophils Relative: 0 %
Eosinophils Absolute: 0 10*3/uL (ref 0.0–0.5)
Eosinophils Relative: 1 %
HCT: 23.2 % — ABNORMAL LOW (ref 39.0–52.0)
Hemoglobin: 7.2 g/dL — ABNORMAL LOW (ref 13.0–17.0)
Immature Granulocytes: 1 %
Lymphocytes Relative: 14 %
Lymphs Abs: 1 10*3/uL (ref 0.7–4.0)
MCH: 23.4 pg — ABNORMAL LOW (ref 26.0–34.0)
MCHC: 31 g/dL (ref 30.0–36.0)
MCV: 75.3 fL — ABNORMAL LOW (ref 80.0–100.0)
Monocytes Absolute: 0.7 10*3/uL (ref 0.1–1.0)
Monocytes Relative: 10 %
Neutro Abs: 4.9 10*3/uL (ref 1.7–7.7)
Neutrophils Relative %: 74 %
Platelets: 296 10*3/uL (ref 150–400)
RBC: 3.08 MIL/uL — ABNORMAL LOW (ref 4.22–5.81)
RDW: 17.2 % — ABNORMAL HIGH (ref 11.5–15.5)
WBC: 6.6 10*3/uL (ref 4.0–10.5)
nRBC: 0 % (ref 0.0–0.2)

## 2021-02-21 LAB — HEMOGLOBIN A1C
Hgb A1c MFr Bld: 5 % (ref 4.8–5.6)
Mean Plasma Glucose: 96.8 mg/dL

## 2021-02-21 LAB — GLUCOSE, CAPILLARY
Glucose-Capillary: 130 mg/dL — ABNORMAL HIGH (ref 70–99)
Glucose-Capillary: 121 mg/dL — ABNORMAL HIGH (ref 70–99)
Glucose-Capillary: 122 mg/dL — ABNORMAL HIGH (ref 70–99)
Glucose-Capillary: 123 mg/dL — ABNORMAL HIGH (ref 70–99)

## 2021-02-21 LAB — VITAMIN B12: Vitamin B-12: 3011 pg/mL — ABNORMAL HIGH (ref 180–914)

## 2021-02-21 LAB — APTT: aPTT: 28 seconds (ref 24–36)

## 2021-02-21 LAB — HIV ANTIBODY (ROUTINE TESTING W REFLEX): HIV Screen 4th Generation wRfx: NONREACTIVE

## 2021-02-21 LAB — PROTIME-INR
INR: 1.2 (ref 0.8–1.2)
Prothrombin Time: 15.4 seconds — ABNORMAL HIGH (ref 11.4–15.2)

## 2021-02-21 SURGERY — ESOPHAGOGASTRODUODENOSCOPY (EGD) WITH PROPOFOL
Anesthesia: General

## 2021-02-21 MED ORDER — SODIUM CHLORIDE 0.9 % IV SOLN
200.0000 mg | Freq: Every day | INTRAVENOUS | Status: AC
  Administered 2021-02-21 – 2021-02-23 (×3): 200 mg via INTRAVENOUS
  Filled 2021-02-21 (×3): qty 10

## 2021-02-21 MED ORDER — SUMATRIPTAN SUCCINATE 6 MG/0.5ML ~~LOC~~ SOLN
6.0000 mg | Freq: Once | SUBCUTANEOUS | Status: DC
  Filled 2021-02-21: qty 0.5

## 2021-02-21 MED ORDER — PROPOFOL 500 MG/50ML IV EMUL
INTRAVENOUS | Status: DC | PRN
  Administered 2021-02-21: 150 ug/kg/min via INTRAVENOUS

## 2021-02-21 MED ORDER — LIDOCAINE HCL (CARDIAC) PF 100 MG/5ML IV SOSY
PREFILLED_SYRINGE | INTRAVENOUS | Status: DC | PRN
  Administered 2021-02-21: 50 mg via INTRAVENOUS

## 2021-02-21 MED ORDER — GLYCOPYRROLATE 0.2 MG/ML IJ SOLN
INTRAMUSCULAR | Status: AC
  Filled 2021-02-21: qty 1

## 2021-02-21 MED ORDER — DEXMEDETOMIDINE (PRECEDEX) IN NS 20 MCG/5ML (4 MCG/ML) IV SYRINGE
PREFILLED_SYRINGE | INTRAVENOUS | Status: DC | PRN
  Administered 2021-02-21: 4 ug via INTRAVENOUS

## 2021-02-21 MED ORDER — EPHEDRINE 5 MG/ML INJ
INTRAVENOUS | Status: AC
  Filled 2021-02-21: qty 5

## 2021-02-21 MED ORDER — SODIUM CHLORIDE 0.9 % IV SOLN
INTRAVENOUS | Status: DC
  Administered 2021-02-21: 1000 mL via INTRAVENOUS

## 2021-02-21 MED ORDER — PROPOFOL 10 MG/ML IV BOLUS
INTRAVENOUS | Status: DC | PRN
  Administered 2021-02-21: 50 mg via INTRAVENOUS

## 2021-02-21 MED ORDER — GLYCOPYRROLATE 0.2 MG/ML IJ SOLN
INTRAMUSCULAR | Status: DC | PRN
  Administered 2021-02-21: .2 mg via INTRAVENOUS

## 2021-02-21 MED ORDER — EPHEDRINE SULFATE 50 MG/ML IJ SOLN
INTRAMUSCULAR | Status: DC | PRN
  Administered 2021-02-21: 2.5 mg via INTRAVENOUS

## 2021-02-21 MED ORDER — PHENYLEPHRINE HCL (PRESSORS) 10 MG/ML IV SOLN
INTRAVENOUS | Status: AC
  Filled 2021-02-21: qty 1

## 2021-02-21 MED ORDER — DEXMEDETOMIDINE HCL IN NACL 200 MCG/50ML IV SOLN
INTRAVENOUS | Status: AC
  Filled 2021-02-21: qty 50

## 2021-02-21 NOTE — Interval H&P Note (Signed)
History and Physical Interval Note: Progress note from 02/21/21  was reviewed and there was no interval change after seeing and examining the patient.  Written consent was obtained from the patient after discussion of risks, benefits, and alternatives. Patient has consented to proceed with Esophagogastroduodenoscopy with possible intervention   02/21/2021 1:55 PM  Zachary Perkins  has presented today for surgery, with the diagnosis of gastric mass.  The various methods of treatment have been discussed with the patient and family. After consideration of risks, benefits and other options for treatment, the patient has consented to  Procedure(s) with comments: ESOPHAGOGASTRODUODENOSCOPY (EGD) WITH PROPOFOL (N/A) - after 2pm as a surgical intervention.  The patient's history has been reviewed, patient examined, no change in status, stable for surgery.  I have reviewed the patient's chart and labs.  Questions were answered to the patient's satisfaction.     Annamaria Helling

## 2021-02-21 NOTE — Op Note (Addendum)
[facility] Patient Name: Zachary Perkins Procedure Date: 02/21/2021 1:42 PM MRN: [id number] Account #: [id number] Date of Birth: 1960/07/29 Admit Type: Outpatient Age: 60 Room: [facility] ENDO ROOM 2 Gender: Male Note Status: Finalized Instrument Name: Michaelle Birks [id number] Procedure:             Upper GI endoscopy Indications:           Abnormal CT of the GI tract Providers:             Annamaria Helling DO, DO Medicines:             Monitored Anesthesia Care Complications:         No immediate complications. Estimated blood loss:                         Minimal. Procedure:             Pre-Anesthesia Assessment:                        - Prior to the procedure, a History and Physical was                         performed, and patient medications and allergies were                         reviewed. The patient is competent. The risks and                         benefits of the procedure and the sedation options and                         risks were discussed with the patient. All questions                         were answered and informed consent was obtained.                         Patient identification and proposed procedure were                         verified by the physician, the nurse, the                         anesthesiologist and the technician in the endoscopy                         suite. Mental Status Examination: alert and oriented.                         Airway Examination: normal oropharyngeal airway and                         neck mobility. Respiratory Examination: clear to                         auscultation. CV Examination: RRR, no murmurs, no S3                         or S4. Prophylactic Antibiotics: The  patient does not                         require prophylactic antibiotics. Prior                         Anticoagulants: The patient has taken no previous                         anticoagulant or antiplatelet agents. ASA  Grade                         Assessment: III - A patient with severe systemic                         disease. After reviewing the risks and benefits, the                         patient was deemed in satisfactory condition to                         undergo the procedure. The anesthesia plan was to use                         monitored anesthesia care (MAC). Immediately prior to                         administration of medications, the patient was                         re-assessed for adequacy to receive sedatives. The                         heart rate, respiratory rate, oxygen saturations,                         blood pressure, adequacy of pulmonary ventilation, and                         response to care were monitored throughout the                         procedure. The physical status of the patient was                         re-assessed after the procedure.                        After obtaining informed consent, the endoscope was                         passed under direct vision. Throughout the procedure,                         the patient's blood pressure, pulse, and oxygen                         saturations were monitored continuously. The Endoscope  was introduced through the mouth, and advanced to the                         second part of duodenum. The upper GI endoscopy was                         accomplished without difficulty. The patient tolerated                         the procedure well. Findings:      Esophagogastric landmarks were identified: the Z-line was found at 40 cm       and the gastroesophageal junction was found at 40 cm from the incisors.      The examined esophagus was normal.      Localized mild inflammation characterized by erosions was found in the       duodenal bulb. Biopsies were taken with a cold forceps for histology.       Estimated blood loss was minimal.      The second portion of the duodenum was normal.  Estimated blood loss:       none.      A large, fungating and ulcerated, non-circumferential mass with no       bleeding and no stigmata of recent bleeding was found on the greater       curvature of the stomach. Biopsies were taken with a cold forceps for       histology. Estimated blood loss was minimal.      The mass started at approximately 47 cm from the incisors sparing the       GEJ, but encroaching into the cardia. This extended along the greater       curvature to approximately 52 cm from the incisors. Large amounts of       necrotic and ulcerated appearing tissue. Not actively bleeding or       oozing. No old or fresh blood was noted in the stomach upon entry.      Localized mild inflammation characterized by erythema was found in the       gastric antrum. Biopsies were taken with a cold forceps for histology.       Estimated blood loss was minimal. Impression:            - Esophagogastric landmarks identified.                        - Normal esophagus.                        - Acute duodenitis. Biopsied.                        - Normal second portion of the duodenum.                        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 Biopsied.                        - Gastritis. Biopsied. Recommendation:        - Advance diet as tolerated and  full liquid diet.                        - Continue present medications.                        - Await pathology results.                        - Use Protonix (pantoprazole) 40 mg IV BID [Duration].                        - Return patient to hospital ward for ongoing care.                        - The findings and recommendations were discussed with                         the referring physician/ hospitalist. Procedure Code(s):     --- Professional ---                        [phone], Esophagogastroduodenoscopy, flexible,                         transoral; with biopsy, single or  multiple Diagnosis Code(s):     --- Professional ---                        K29.80, Duodenitis without bleeding                        D49.0, Neoplasm of unspecified behavior of digestive                         system                        K29.70, Gastritis, unspecified, without bleeding                        R93.3, Abnormal findings on diagnostic imaging of                         other parts of digestive tract CPT copyright 2019 American Medical Association. All rights reserved. The codes documented in this report are preliminary and upon coder review may  be revised to meet current compliance requirements. Attending Participation:      I personally performed the entire procedure. Volney American, DO Annamaria Helling DO, DO 02/21/2021 2:43:18 PM This report has been signed electronically. Number of Addenda: 0 Note Initiated On: 02/21/2021 1:42 PM Estimated Blood Loss:  Estimated blood loss was minimal.      [facility]

## 2021-02-21 NOTE — Transfer of Care (Signed)
Immediate Anesthesia Transfer of Care Note  Patient: Zachary Perkins  Procedure(s) Performed: ESOPHAGOGASTRODUODENOSCOPY (EGD) WITH PROPOFOL  Patient Location: Endoscopy Unit  Anesthesia Type:General  Level of Consciousness: drowsy  Airway & Oxygen Therapy: Patient Spontanous Breathing  Post-op Assessment: Report given to RN and Post -op Vital signs reviewed and stable  Post vital signs: Reviewed and stable  Last Vitals:  Vitals Value Taken Time  BP 107/70 02/21/21 1427  Temp    Pulse 64 02/21/21 1427  Resp 11 02/21/21 1427  SpO2 99 % 02/21/21 1427  Vitals shown include unvalidated device data.  Last Pain:  Vitals:   02/21/21 1427  TempSrc:   PainSc: Asleep         Complications: No notable events documented.

## 2021-02-21 NOTE — Anesthesia Preprocedure Evaluation (Signed)
Anesthesia Evaluation  Patient identified by MRN, date of birth, ID band Patient awake    Reviewed: Allergy & Precautions, NPO status , Patient's Chart, lab work & pertinent test results  History of Anesthesia Complications Negative for: history of anesthetic complications  Airway Mallampati: III  TM Distance: <3 FB Neck ROM: full    Dental  (+) Chipped   Pulmonary neg pulmonary ROS, neg shortness of breath, former smoker,    Pulmonary exam normal        Cardiovascular Exercise Tolerance: Good hypertension, (-) anginaNormal cardiovascular exam     Neuro/Psych negative neurological ROS  negative psych ROS   GI/Hepatic negative GI ROS, Neg liver ROS, neg GERD  ,  Endo/Other  diabetes, Type 2Hypothyroidism   Renal/GU negative Renal ROS  negative genitourinary   Musculoskeletal   Abdominal   Peds  Hematology negative hematology ROS (+)   Anesthesia Other Findings Patient is NPO appropriate and reports no nausea or vomiting today.  Past Medical History: No date: Diabetes mellitus without complication ([place]) No date: Hypertension No date: Hypothyroidism No date: Morbid obesity ([place])  Past Surgical History: No date: CARPAL TUNNEL RELEASE; Left 05/02/2017: INSERTION OF MESH; N/A     Comment:  Procedure: INSERTION OF MESH;  Surgeon: Leonie Green, MD;  Location: [facility] ORS;  Service: General;                Laterality: N/A; [id number]: UMBILICAL HERNIA REPAIR; N/A     Comment:  Procedure: HERNIA REPAIR UMBILICAL ADULT;  Surgeon:               Leonie Green, MD;  Location: [facility] ORS;  Service:               General;  Laterality: N/A;  BMI    Body Mass Index: 33.96 kg/m      Reproductive/Obstetrics negative OB ROS                             Anesthesia Physical Anesthesia Plan  ASA: 3  Anesthesia Plan: General   Post-op Pain Management:    Induction:  Intravenous  PONV Risk Score and Plan: Propofol infusion and TIVA  Airway Management Planned: Natural Airway and Nasal Cannula  Additional Equipment:   Intra-op Plan:   Post-operative Plan:   Informed Consent: I have reviewed the patients History and Physical, chart, labs and discussed the procedure including the risks, benefits and alternatives for the proposed anesthesia with the patient or authorized representative who has indicated his/her understanding and acceptance.     Dental Advisory Given  Plan Discussed with: Anesthesiologist, CRNA and Surgeon  Anesthesia Plan Comments: (Patient consented for risks of anesthesia including but not limited to:  - adverse reactions to medications - risk of airway placement if required - damage to eyes, teeth, lips or other oral mucosa - nerve damage due to positioning  - sore throat or hoarseness - Damage to heart, brain, nerves, lungs, other parts of body or loss of life  Patient voiced understanding.)        Anesthesia Quick Evaluation

## 2021-02-21 NOTE — Progress Notes (Signed)
EGD performed today- formal note in epic under procedures  Discussed case with surgery and hospitalist teams. Stat path read on gastric mass ordered Discussed findings with patient.  Continue iv ppi 40 mg bid. Ok for full liquid diet from [employer] and advance as tolerated. Continue to monitor h&h with transfusion and resuscitation as per primary team. Awaiting path. No further intervention at this time from GI. Will sign off, but available as needed.  Annamaria Helling, DO [facility]astroenterology

## 2021-02-21 NOTE — Consult Note (Addendum)
Hematology/Oncology Consult note [facility] Telephone:([phone] [phone]  Patient Care Team: Sharyne Peach, MD as PCP - General (Family Medicine)   Name of the patient: Zachary Perkins  JN:[id number]  10-27-1960   Date of visit: 02/21/21 REASON FOR COSULTATION:  Gastric mass History of presenting illness-  60 y.o. male with PMH including diabetes, hypertension, obesity, hypothyroidism at below who presents to ER for evaluation of abdominal pain, nausea vomiting and constipation.  Poor oral intake, fatigue.  He denies any hematemesis. Unintentional weight loss about 30 pounds over the past few months. Patient had outpatient CBC done on 01/02/2021, obtained by primary care provider. Hemoglobin was low at 10.2.  MCV 74.  Patient was advised to take oral iron supplementation. Emergency room, patient was found to have a hemoglobin of 7.0.  MCV 72.6. Iron panel showed saturation of 6, ferritin of 58.  Chemistry also reviewed calcium of 11.7 on 02/20/2021.  02/21/2021 repeat calcium level decreased to 10.9  02/20/2021 CT abdomen pelvis reviewed large heterogeneous mass of the left upper quadrant with central cavitation centered at the body of the stomach and spleen and involving the splenic artery, splenic vein, tail of the pancreas.  Favor gastric or splenic primary neoplasm.  Enlarged periportal lymph nodes, concerning for nodal metastasis.  Trace left pleural effusion and a trace free fluid in the pelvis.  Aortic atherosclerosis.  Patient was admitted for supportive care, gastroenterology was consulted and he is boarded for EGD this afternoon. Reports having headache.  He has a history of migraine.  Review of Systems  Constitutional:  Positive for appetite change, fatigue and unexpected weight change. Negative for chills and fever.  HENT:   Negative for hearing loss and voice change.   Eyes:  Negative for eye problems and icterus.  Respiratory:  Negative for  chest tightness, cough and shortness of breath.   Cardiovascular:  Negative for chest pain and leg swelling.  Gastrointestinal:  Positive for abdominal pain, nausea and vomiting. Negative for abdominal distention and blood in stool.  Endocrine: Negative for hot flashes.  Genitourinary:  Negative for difficulty urinating, dysuria and frequency.   Musculoskeletal:  Negative for arthralgias.  Skin:  Negative for itching and rash.  Neurological:  Positive for headaches. Negative for light-headedness and numbness.  Hematological:  Negative for adenopathy. Does not bruise/bleed easily.  Psychiatric/Behavioral:  Negative for confusion.    Allergies  Allergen Reactions   Lisinopril Cough   Metformin And Related     PASSED OUT    Patient Active Problem List   Diagnosis Date Noted   Gastric mass 02/20/2021   Weight loss 02/20/2021   Acute on chronic anemia 02/20/2021   Diabetes mellitus type II, controlled ([place]) 02/20/2021   Nausea in adult 02/20/2021   Constipation 02/20/2021   Essential hypertension 02/20/2021   Hypothyroidism 02/20/2021     Past Medical History:  Diagnosis Date   Diabetes mellitus without complication ([place])    Hypertension    Hypothyroidism    Morbid obesity ([place])      Past Surgical History:  Procedure Laterality Date   CARPAL TUNNEL RELEASE Left    INSERTION OF MESH N/A 05/02/2017   Procedure: INSERTION OF MESH;  Surgeon: Leonie Green, MD;  Location: [facility] ORS;  Service: General;  Laterality: N/A;   UMBILICAL HERNIA REPAIR N/A 05/02/2017   Procedure: HERNIA REPAIR UMBILICAL ADULT;  Surgeon: Leonie Green, MD;  Location: [facility] ORS;  Service: General;  Laterality: N/A;  Social History   Socioeconomic History   Marital status: Married    Spouse name: Melonie   Number of children: 0   Years of education: 12   Highest education level: 12th grade  Occupational History   Not on file  Tobacco Use   Smoking status: Former    Packs/day:  1.00    Years: 20.00    Pack years: 20.00    Types: Cigarettes    Quit date: 04/25/2007    Years since quitting: 13.8   Smokeless tobacco: Never  Vaping Use   Vaping Use: Never used  Substance and Sexual Activity   Alcohol use: Yes    Comment: rare   Drug use: No   Sexual activity: Yes  Other Topics Concern   Not on file  Social History Narrative   Not on file   Social Determinants of Health   Financial Resource Strain: Not on file  Food Insecurity: Not on file  Transportation Needs: Not on file  Physical Activity: Not on file  Stress: Not on file  Social Connections: Not on file  Intimate Partner Violence: Not on file     History reviewed. No pertinent family history.   Current Facility-Administered Medications:    acetaminophen (TYLENOL) tablet 650 mg, 650 mg, Oral, Q6H PRN, 650 mg at 02/20/21 2044 **OR** acetaminophen (TYLENOL) suppository 650 mg, 650 mg, Rectal, Q6H PRN, Howington, Einar Pheasant, MD   dextrose 5 %-0.9 % sodium chloride infusion, , Intravenous, Continuous, Howington, Einar Pheasant, MD   dextrose 5 %-0.9 % sodium chloride infusion, , Intravenous, Continuous, Howington, Einar Pheasant, MD, Last Rate: 75 mL/hr at 02/21/21 1041, New Bag at 02/21/21 1041   HYDROmorphone (DILAUDID) injection 0.5-1 mg, 0.5-1 mg, Intravenous, Q2H PRN, Howington, Einar Pheasant, MD, 1 mg at 02/21/21 0928   insulin aspart (novoLOG) injection 0-9 Units, 0-9 Units, Subcutaneous, TID WC, Howington, Einar Pheasant, MD   ondansetron [facility]) injection 4 mg, 4 mg, Intravenous, Q6H PRN, Sharion Settler, NP, 4 mg at 02/21/21 [id number]   oxyCODONE (Oxy IR/ROXICODONE) immediate release tablet 5 mg, 5 mg, Oral, Q4H PRN, Howington, Einar Pheasant, MD, 5 mg at 02/21/21 0806   pantoprazole (PROTONIX) injection 40 mg, 40 mg, Intravenous, Q12H, Howington, Einar Pheasant, MD, 40 mg at 02/21/21 0919   SUMAtriptan (IMITREX) injection 6 mg, 6 mg, Subcutaneous, Once, Jules Husbands, MD   Physical exam:  Vitals:   02/21/21 1437 02/21/21 1447  02/21/21 1457 02/21/21 1531  BP: 125/80 118/68 123/76 129/85  Pulse: 74 73 71 72  Resp: 18 (!) '21 20 18  '$ Temp:    98 F (36.7 C)  TempSrc:    Oral  SpO2: 99% 99% 98% 100%  Weight:      Height:       Physical Exam Constitutional:      General: He is not in acute distress.    Appearance: He is not diaphoretic.  HENT:     Head: Normocephalic and atraumatic.     Nose: Nose normal.     Mouth/Throat:     Pharynx: No oropharyngeal exudate.  Eyes:     General: No scleral icterus.    Pupils: Pupils are equal, round, and reactive to light.  Cardiovascular:     Rate and Rhythm: Normal rate and regular rhythm.     Heart sounds: No murmur heard. Pulmonary:     Effort: Pulmonary effort is normal. No respiratory distress.     Breath sounds: No rales.  Chest:     Chest  wall: No tenderness.  Abdominal:     General: There is no distension.     Palpations: Abdomen is soft.     Tenderness: There is abdominal tenderness in the left upper quadrant.  Musculoskeletal:        General: Normal range of motion.     Cervical back: Normal range of motion and neck supple.  Skin:    General: Skin is warm and dry.     Coloration: Skin is pale.     Findings: No erythema.  Neurological:     Mental Status: He is alert and oriented to person, place, and time.     Cranial Nerves: No cranial nerve deficit.     Motor: No abnormal muscle tone.     Coordination: Coordination normal.  Psychiatric:        Mood and Affect: Affect normal.        CMP Latest Ref Rng & Units 02/21/2021  Glucose 70 - 99 mg/dL 119(H)  BUN 6 - 20 mg/dL 19  Creatinine 0.61 - 1.24 mg/dL 1.30(H)  Sodium 135 - 145 mmol/L 136  Potassium 3.5 - 5.1 mmol/L 4.0  Chloride 98 - 111 mmol/L 99  CO2 22 - 32 mmol/L 31  Calcium 8.9 - 10.3 mg/dL 10.9(H)  Total Protein 6.5 - 8.1 g/dL 6.7  Total Bilirubin 0.3 - 1.2 mg/dL 0.5  Alkaline Phos 38 - 126 U/L 56  AST 15 - 41 U/L 20  ALT 0 - 44 U/L 23   CBC Latest Ref Rng & Units 02/21/2021  WBC  4.0 - 10.5 K/uL 6.6  Hemoglobin 13.0 - 17.0 g/dL 7.2(L)  Hematocrit 39.0 - 52.0 % 23.2(L)  Platelets 150 - 400 K/uL 296    RADIOGRAPHIC STUDIES: I have personally reviewed the radiological images as listed and agreed with the findings in the report. CT ABDOMEN PELVIS W CONTRAST  Result Date: 02/20/2021 CLINICAL DATA:  Bowel obstruction suspected EXAM: CT ABDOMEN AND PELVIS WITH CONTRAST TECHNIQUE: Multidetector CT imaging of the abdomen and pelvis was performed using the standard protocol following bolus administration of intravenous contrast. CONTRAST:  128m OMNIPAQUE IOHEXOL 350 MG/ML SOLN COMPARISON:  None. FINDINGS: Lower chest: Trace left pleural effusion. Hepatobiliary: No focal liver abnormality is seen. No gallstones, gallbladder wall thickening, or biliary dilatation. Pancreas: Pancreatic atrophy. Tail of the pancreas is located adjacent to a large left upper quadrant mass which is described below with no intervening fat plane. Spleen/Stomach: Large heterogeneous mass of the left upper quadrant centered at the body of the stomach and spleen and involving the splenic artery and vein and tail of the pancreas. Mass measures approximately 12.0 x 11.4 cm in axial dimension. Air seen within the mass, likely due to fistulization with the stomach and/or necrosis. Small and large bowel: Normal in caliber with no evidence of wall thickening or obstruction. Normal appendix. Numerous colonic diverticula, most pronounced in the sigmoid colon. Adrenals/Urinary Tract: Adrenal glands are unremarkable. Kidneys are normal, without renal calculi, focal lesion, or hydronephrosis. Bladder is unremarkable. Vascular/Lymphatic: Enlarged periportal lymph nodes. Reference periportal lymph node measuring 1.4 cm in short axis located on series 2, image 23. Aortic atherosclerosis Reproductive: Prostate is unremarkable. Other: No abdominal wall hernia or abnormality. Trace free fluid in the pelvis. Musculoskeletal: No acute or  significant osseous findings. IMPRESSION: Large heterogeneous mass of the left upper quadrant with central cavitation centered at the body of the stomach and spleen and involving the splenic artery, splenic vein and tail of the pancreas. Favor gastric or  splenic primary neoplasm. Enlarged periportal lymph nodes, concerning for nodal metastatic disease. Trace left pleural effusion and trace free fluid in the pelvis. Aortic Atherosclerosis (ICD10-I70.0). Electronically Signed   By: Yetta Glassman M.D.   On: 02/20/2021 16:21    Assessment and plan-   #Large gastric mass  periportal lymph node enlargement. -02/21/2021 EGD showed large fungating ulcerated noncircumferential mass with no bleeding and no stigmata of recent bleeding was found on the greater curvature of the stomach.  Biopsy was taken.  Large amount of necrotic and ulcerated appearing tissue.  Acute duodenitis.  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 Awaiting for pathology report. He will need outpatient PET scan for staging  #Severe iron deficiency anemia. 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 Plan IV iron with Venofer '200mg'$  daily x 3. Allergy reactions/infusion reaction including anaphylactic reaction discussed with patient. Other side effects include but not limited to high blood pressure, headache skin rash, weight gain, leg swelling, etc. Patient voices understanding and willing to proceed. Venofer was ordered.    Thank you for allowing me to participate in the care of this patient.     Earlie Server, MD, PhD Hematology Oncology [place] at [facility]  02/21/2021

## 2021-02-21 NOTE — H&P (View-Only) (Signed)
[facility] Gastroenterology Inpatient Follow-up/Progress Note   Patient ID: Zachary Perkins is a 60 y.o. male.  Overnight Events / Subjective Findings Still with abdominal pain which is being controlled. Nausea also present, no vomiting. Nothing po after midnight. No BM o/n. No dvt ppx. Hgb at 7.2 despite 1 uprbc yesterday. No other acute complaints.  Review of Systems  Constitutional:  Positive for activity change, appetite change, fatigue and unexpected weight change. Negative for chills and fever.  HENT:  Negative for trouble swallowing and voice change.   Respiratory:  Negative for shortness of breath and wheezing.   Cardiovascular:  Negative for chest pain, palpitations and leg swelling.  Gastrointestinal:  Positive for abdominal pain, blood in stool and nausea. Negative for abdominal distention, anal bleeding, constipation, diarrhea and vomiting.  Musculoskeletal:  Negative for arthralgias and myalgias.  Skin:  Negative for color change and pallor.  Neurological:  Negative for dizziness, syncope and weakness.  Psychiatric/Behavioral:  Negative for agitation and confusion. The patient is not nervous/anxious.   All other systems reviewed and are negative.   Medications  Current Facility-Administered Medications:    [MAR Hold] acetaminophen (TYLENOL) tablet 650 mg, 650 mg, Oral, Q6H PRN, 650 mg at 02/20/21 2044 **OR** [MAR Hold] acetaminophen (TYLENOL) suppository 650 mg, 650 mg, Rectal, Q6H PRN, Howington, Einar Pheasant, MD   dextrose 5 %-0.9 % sodium chloride infusion, , Intravenous, Continuous, Howington, Einar Pheasant, MD   dextrose 5 %-0.9 % sodium chloride infusion, , Intravenous, Continuous, Howington, Einar Pheasant, MD, Last Rate: 75 mL/hr at 02/21/21 1041, New Bag at 02/21/21 1041   [MAR Hold] HYDROmorphone (DILAUDID) injection 0.5-1 mg, 0.5-1 mg, Intravenous, Q2H PRN, Howington, Einar Pheasant, MD, 1 mg at 02/21/21 0928   [MAR Hold] insulin aspart (novoLOG) injection 0-9 Units, 0-9 Units,  Subcutaneous, TID WC, Howington, Einar Pheasant, MD   [MAR Hold] ondansetron [facility]) injection 4 mg, 4 mg, Intravenous, Q6H PRN, Sharion Settler, NP, 4 mg at 02/21/21 0933   [MAR Hold] oxyCODONE (Oxy IR/ROXICODONE) immediate release tablet 5 mg, 5 mg, Oral, Q4H PRN, Howington, Einar Pheasant, MD, 5 mg at 02/21/21 0806   [MAR Hold] pantoprazole (PROTONIX) injection 40 mg, 40 mg, Intravenous, Q12H, Howington, Einar Pheasant, MD, 40 mg at 02/21/21 0919   [MAR Hold] SUMAtriptan (IMITREX) injection 6 mg, 6 mg, Subcutaneous, Once, Pabon, Diego F, MD  dextrose 5 % and 0.9% NaCl     dextrose 5 % and 0.9% NaCl 75 mL/hr at 02/21/21 1041    [MAR Hold] acetaminophen **OR** [MAR Hold] acetaminophen, [MAR Hold]  HYDROmorphone (DILAUDID) injection, [MAR Hold] ondansetron (ZOFRAN) IV, [MAR Hold] oxyCODONE   Objective    Vitals:   02/21/21 0015 02/21/21 0349 02/21/21 0806 02/21/21 1119  BP:  139/82 124/80 132/81  Pulse: 66 64 62 61  Resp:  '18 19 18  '$ Temp:   98 F (36.7 C) 98 F (36.7 C)  TempSrc:   Oral Oral  SpO2: 95% 100% 100% 100%  Weight:      Height:         Physical Exam Vitals and nursing note reviewed.  Constitutional:      General: He is not in acute distress.    Appearance: Normal appearance. He is obese. He is ill-appearing. He is not toxic-appearing or diaphoretic.  HENT:     Head: Normocephalic and atraumatic.     Nose: Nose normal.     Mouth/Throat:     Mouth: Mucous membranes are moist.     Pharynx: Oropharynx is clear.  Eyes:     General: No scleral icterus.    Extraocular Movements: Extraocular movements intact.  Cardiovascular:     Rate and Rhythm: Normal rate and regular rhythm.     Heart sounds: Normal heart sounds. No murmur heard.   No friction rub. No gallop.  Pulmonary:     Effort: Pulmonary effort is normal. No respiratory distress.     Breath sounds: Normal breath sounds. No wheezing, rhonchi or rales.  Abdominal:     General: Bowel sounds are normal. There is no distension.      Palpations: Abdomen is soft.     Tenderness: There is abdominal tenderness in the left upper quadrant. There is no guarding or rebound.  Musculoskeletal:     Cervical back: Neck supple.     Right lower leg: No edema.     Left lower leg: No edema.  Skin:    General: Skin is warm and dry.     Coloration: Skin is pale. Skin is not jaundiced.  Neurological:     General: No focal deficit present.     Mental Status: He is alert and oriented to person, place, and time. Mental status is at baseline.  Psychiatric:        Mood and Affect: Mood normal.        Behavior: Behavior normal.        Thought Content: Thought content normal.        Judgment: Judgment normal.     Laboratory Data Recent Labs  Lab 02/20/21 1335 02/21/21 0619  WBC 7.5 6.6  HGB 7.0* 7.2*  HCT 23.3* 23.2*  PLT 325 296  NEUTOPHILPCT  --  74  LYMPHOPCT  --  14  MONOPCT  --  10  EOSPCT  --  1   Recent Labs  Lab 02/20/21 1335 02/21/21 0619  NA 136 136  K 3.6 4.0  CL 97* 99  CO2 29 31  BUN 20 19  CREATININE 1.23 1.30*  CALCIUM 11.7* 10.9*  PROT 7.2 6.7  BILITOT 0.3 0.5  ALKPHOS 63 56  ALT 30 23  AST 28 20  GLUCOSE 111* 119*   Recent Labs  Lab 02/20/21 1731 02/21/21 0619  INR 1.2 1.2      Imaging Studies: CT ABDOMEN PELVIS W CONTRAST  Result Date: 02/20/2021 CLINICAL DATA:  Bowel obstruction suspected EXAM: CT ABDOMEN AND PELVIS WITH CONTRAST TECHNIQUE: Multidetector CT imaging of the abdomen and pelvis was performed using the standard protocol following bolus administration of intravenous contrast. CONTRAST:  127m OMNIPAQUE IOHEXOL 350 MG/ML SOLN COMPARISON:  None. FINDINGS: Lower chest: Trace left pleural effusion. Hepatobiliary: No focal liver abnormality is seen. No gallstones, gallbladder wall thickening, or biliary dilatation. Pancreas: Pancreatic atrophy. Tail of the pancreas is located adjacent to a large left upper quadrant mass which is described below with no intervening fat plane.  Spleen/Stomach: Large heterogeneous mass of the left upper quadrant centered at the body of the stomach and spleen and involving the splenic artery and vein and tail of the pancreas. Mass measures approximately 12.0 x 11.4 cm in axial dimension. Air seen within the mass, likely due to fistulization with the stomach and/or necrosis. Small and large bowel: Normal in caliber with no evidence of wall thickening or obstruction. Normal appendix. Numerous colonic diverticula, most pronounced in the sigmoid colon. Adrenals/Urinary Tract: Adrenal glands are unremarkable. Kidneys are normal, without renal calculi, focal lesion, or hydronephrosis. Bladder is unremarkable. Vascular/Lymphatic: Enlarged periportal lymph nodes. Reference periportal lymph node measuring  1.4 cm in short axis located on series 2, image 23. Aortic atherosclerosis Reproductive: Prostate is unremarkable. Other: No abdominal wall hernia or abnormality. Trace free fluid in the pelvis. Musculoskeletal: No acute or significant osseous findings. IMPRESSION: Large heterogeneous mass of the left upper quadrant with central cavitation centered at the body of the stomach and spleen and involving the splenic artery, splenic vein and tail of the pancreas. Favor gastric or splenic primary neoplasm. Enlarged periportal lymph nodes, concerning for nodal metastatic disease. Trace left pleural effusion and trace free fluid in the pelvis. Aortic Atherosclerosis (ICD10-I70.0). Electronically Signed   By: Yetta Glassman M.D.   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             - 12.4x11.4 cm             - potential necrosis vs fistulization             - suspect nodal involvement             - splenic vein/artery involvement and distal tail of pancreas # abnormal weight loss- likely 2/2 above             - 30lbs in 2 months; early satiety # abdominal pain 2/2 above # potential melena             - on iron-  dark stools, hgb decreasing             - bun/cr 20/1.23 # Anemia- suspected 2/2 blood loss             - hgb 7.5 today from 10.2 and 13.8 over the last year             - iron deficient # nausea without vomiting # constipation # worsening fatigue and generalized weakness # dyspnea on exertion # diverticulosis             - noted on imaging # obesity # dm2 # htn  Plan:  EGD today with possible intervention Npo since midnight No dvt ppx Continue to monitor h&h; transfusion and resuscitation as per primary team Continue iv protonix 40 mg iv bid Supportive care including antiemetics and pain control as per primary team Labs and imaging reviewed and interpreted by me If patient develops hematochezia/hematemesis or becomes hemodynamically unstable recommend urgent IR/surgical consultation as may have developed bleeding from fistula with vessel which endoscopy would not be able to stop  Esophagogastroduodenoscopy with possible biopsy, control of bleeding, polypectomy, and interventions as necessary has been discussed with the patient/patient representative. Informed consent was obtained from the patient/patient representative after explaining the indication, nature, and risks of the procedure including but not limited to death, bleeding, perforation, missed neoplasm/lesions, cardiorespiratory compromise, and reaction to medications. Opportunity for questions was given and appropriate answers were provided. Patient/patient representative has verbalized understanding is amenable to undergoing the procedure.  I personally performed the service.  Management of other medical comorbidities as per primary team  Thank you for allowing [place] to participate in this patient's care. Please don't hesitate to call if any questions or concerns arise.   Annamaria Helling, DO [facility] Gastroenterology  Portions of the record may have been created with voice recognition software. Occasional wrong-word or  'sound-a-like' substitutions may have occurred due to the inherent limitations of voice recognition software.  Read the chart carefully and recognize, using context, where substitutions may have occurred.

## 2021-02-21 NOTE — Progress Notes (Signed)
[facility]ology Inpatient Follow-up/Progress Note   Patient ID: Zachary Perkins is a 60 y.o. male.  Overnight Events / Subjective Findings Still with abdominal pain which is being controlled. Nausea also present, no vomiting. Nothing po after midnight. No BM o/n. No dvt ppx. Hgb at 7.2 despite 1 uprbc yesterday. No other acute complaints.  Review of Systems  Constitutional:  Positive for activity change, appetite change, fatigue and unexpected weight change. Negative for chills and fever.  HENT:  Negative for trouble swallowing and voice change.   Respiratory:  Negative for shortness of breath and wheezing.   Cardiovascular:  Negative for chest pain, palpitations and leg swelling.  Gastrointestinal:  Positive for abdominal pain, blood in stool and nausea. Negative for abdominal distention, anal bleeding, constipation, diarrhea and vomiting.  Musculoskeletal:  Negative for arthralgias and myalgias.  Skin:  Negative for color change and pallor.  Neurological:  Negative for dizziness, syncope and weakness.  Psychiatric/Behavioral:  Negative for agitation and confusion. The patient is not nervous/anxious.   All other systems reviewed and are negative.   Medications  Current Facility-Administered Medications:    [MAR Hold] acetaminophen (TYLENOL) tablet 650 mg, 650 mg, Oral, Q6H PRN, 650 mg at 02/20/21 2044 **OR** [MAR Hold] acetaminophen (TYLENOL) suppository 650 mg, 650 mg, Rectal, Q6H PRN, Howington, Einar Pheasant, MD   dextrose 5 %-0.9 % sodium chloride infusion, , Intravenous, Continuous, Howington, Einar Pheasant, MD   dextrose 5 %-0.9 % sodium chloride infusion, , Intravenous, Continuous, Howington, Einar Pheasant, MD, Last Rate: 75 mL/hr at 02/21/21 1041, New Bag at 02/21/21 1041   [MAR Hold] HYDROmorphone (DILAUDID) injection 0.5-1 mg, 0.5-1 mg, Intravenous, Q2H PRN, Howington, Einar Pheasant, MD, 1 mg at 02/21/21 0928   [MAR Hold] insulin aspart (novoLOG) injection 0-9 Units, 0-9 Units,  Subcutaneous, TID WC, Howington, Einar Pheasant, MD   [MAR Hold] ondansetron [facility]) injection 4 mg, 4 mg, Intravenous, Q6H PRN, Sharion Settler, NP, 4 mg at 02/21/21 0933   [MAR Hold] oxyCODONE (Oxy IR/ROXICODONE) immediate release tablet 5 mg, 5 mg, Oral, Q4H PRN, Howington, Einar Pheasant, MD, 5 mg at 02/21/21 0806   [MAR Hold] pantoprazole (PROTONIX) injection 40 mg, 40 mg, Intravenous, Q12H, Howington, Einar Pheasant, MD, 40 mg at 02/21/21 0919   [MAR Hold] SUMAtriptan (IMITREX) injection 6 mg, 6 mg, Subcutaneous, Once, Pabon, Diego F, MD  dextrose 5 % and 0.9% NaCl     dextrose 5 % and 0.9% NaCl 75 mL/hr at 02/21/21 1041    [MAR Hold] acetaminophen **OR** [MAR Hold] acetaminophen, [MAR Hold]  HYDROmorphone (DILAUDID) injection, [MAR Hold] ondansetron (ZOFRAN) IV, [MAR Hold] oxyCODONE   Objective    Vitals:   02/21/21 0015 02/21/21 0349 02/21/21 0806 02/21/21 1119  BP:  139/82 124/80 132/81  Pulse: 66 64 62 61  Resp:  '18 19 18  '$ Temp:   98 F (36.7 C) 98 F (36.7 C)  TempSrc:   Oral Oral  SpO2: 95% 100% 100% 100%  Weight:      Height:         Physical Exam Vitals and nursing note reviewed.  Constitutional:      General: He is not in acute distress.    Appearance: Normal appearance. He is obese. He is ill-appearing. He is not toxic-appearing or diaphoretic.  HENT:     Head: Normocephalic and atraumatic.     Nose: Nose normal.     Mouth/Throat:     Mouth: Mucous membranes are moist.     Pharynx: Oropharynx is clear.  Eyes:     General: No scleral icterus.    Extraocular Movements: Extraocular movements intact.  Cardiovascular:     Rate and Rhythm: Normal rate and regular rhythm.     Heart sounds: Normal heart sounds. No murmur heard.   No friction rub. No gallop.  Pulmonary:     Effort: Pulmonary effort is normal. No respiratory distress.     Breath sounds: Normal breath sounds. No wheezing, rhonchi or rales.  Abdominal:     General: Bowel sounds are normal. There is no distension.      Palpations: Abdomen is soft.     Tenderness: There is abdominal tenderness in the left upper quadrant. There is no guarding or rebound.  Musculoskeletal:     Cervical back: Neck supple.     Right lower leg: No edema.     Left lower leg: No edema.  Skin:    General: Skin is warm and dry.     Coloration: Skin is pale. Skin is not jaundiced.  Neurological:     General: No focal deficit present.     Mental Status: He is alert and oriented to person, place, and time. Mental status is at baseline.  Psychiatric:        Mood and Affect: Mood normal.        Behavior: Behavior normal.        Thought Content: Thought content normal.        Judgment: Judgment normal.     Laboratory Data Recent Labs  Lab 02/20/21 1335 02/21/21 0619  WBC 7.5 6.6  HGB 7.0* 7.2*  HCT 23.3* 23.2*  PLT 325 296  NEUTOPHILPCT  --  74  LYMPHOPCT  --  14  MONOPCT  --  10  EOSPCT  --  1   Recent Labs  Lab 02/20/21 1335 02/21/21 0619  NA 136 136  K 3.6 4.0  CL 97* 99  CO2 29 31  BUN 20 19  CREATININE 1.23 1.30*  CALCIUM 11.7* 10.9*  PROT 7.2 6.7  BILITOT 0.3 0.5  ALKPHOS 63 56  ALT 30 23  AST 28 20  GLUCOSE 111* 119*   Recent Labs  Lab 02/20/21 1731 02/21/21 0619  INR 1.2 1.2      Imaging Studies: CT ABDOMEN PELVIS W CONTRAST  Result Date: 02/20/2021 CLINICAL DATA:  Bowel obstruction suspected EXAM: CT ABDOMEN AND PELVIS WITH CONTRAST TECHNIQUE: Multidetector CT imaging of the abdomen and pelvis was performed using the standard protocol following bolus administration of intravenous contrast. CONTRAST:  1110m OMNIPAQUE IOHEXOL 350 MG/ML SOLN COMPARISON:  None. FINDINGS: Lower chest: Trace left pleural effusion. Hepatobiliary: No focal liver abnormality is seen. No gallstones, gallbladder wall thickening, or biliary dilatation. Pancreas: Pancreatic atrophy. Tail of the pancreas is located adjacent to a large left upper quadrant mass which is described below with no intervening fat plane.  Spleen/Stomach: Large heterogeneous mass of the left upper quadrant centered at the body of the stomach and spleen and involving the splenic artery and vein and tail of the pancreas. Mass measures approximately 12.0 x 11.4 cm in axial dimension. Air seen within the mass, likely due to fistulization with the stomach and/or necrosis. Small and large bowel: Normal in caliber with no evidence of wall thickening or obstruction. Normal appendix. Numerous colonic diverticula, most pronounced in the sigmoid colon. Adrenals/Urinary Tract: Adrenal glands are unremarkable. Kidneys are normal, without renal calculi, focal lesion, or hydronephrosis. Bladder is unremarkable. Vascular/Lymphatic: Enlarged periportal lymph nodes. Reference periportal lymph node measuring  1.4 cm in short axis located on series 2, image 23. Aortic atherosclerosis Reproductive: Prostate is unremarkable. Other: No abdominal wall hernia or abnormality. Trace free fluid in the pelvis. Musculoskeletal: No acute or significant osseous findings. IMPRESSION: Large heterogeneous mass of the left upper quadrant with central cavitation centered at the body of the stomach and spleen and involving the splenic artery, splenic vein and tail of the pancreas. Favor gastric or splenic primary neoplasm. Enlarged periportal lymph nodes, concerning for nodal metastatic disease. Trace left pleural effusion and trace free fluid in the pelvis. Aortic Atherosclerosis (ICD10-I70.0). Electronically Signed   By: Yetta Glassman M.D.   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             - 12.4x11.4 cm             - potential necrosis vs fistulization             - suspect nodal involvement             - splenic vein/artery involvement and distal tail of pancreas # abnormal weight loss- likely 2/2 above             - 30lbs in 2 months; early satiety # abdominal pain 2/2 above # potential melena             - on iron-  dark stools, hgb decreasing             - bun/cr 20/1.23 # Anemia- suspected 2/2 blood loss             - hgb 7.5 today from 10.2 and 13.8 over the last year             - iron deficient # nausea without vomiting # constipation # worsening fatigue and generalized weakness # dyspnea on exertion # diverticulosis             - noted on imaging # obesity # dm2 # htn  Plan:  EGD today with possible intervention Npo since midnight No dvt ppx Continue to monitor h&h; transfusion and resuscitation as per primary team Continue iv protonix 40 mg iv bid Supportive care including antiemetics and pain control as per primary team Labs and imaging reviewed and interpreted by me If patient develops hematochezia/hematemesis or becomes hemodynamically unstable recommend urgent IR/surgical consultation as may have developed bleeding from fistula with vessel which endoscopy would not be able to stop  Esophagogastroduodenoscopy with possible biopsy, control of bleeding, polypectomy, and interventions as necessary has been discussed with the patient/patient representative. Informed consent was obtained from the patient/patient representative after explaining the indication, nature, and risks of the procedure including but not limited to death, bleeding, perforation, missed neoplasm/lesions, cardiorespiratory compromise, and reaction to medications. Opportunity for questions was given and appropriate answers were provided. Patient/patient representative has verbalized understanding is amenable to undergoing the procedure.  I personally performed the service.  Management of other medical comorbidities as per primary team  Thank you for allowing [place] to participate in this patient's care. Please don't hesitate to call if any questions or concerns arise.   Annamaria Helling, DO [facility]  Portions of the record may have been created with voice recognition software. Occasional wrong-word or  'sound-a-like' substitutions may have occurred due to the inherent limitations of voice recognition software.  Read the chart carefully and recognize, using context, where substitutions may have occurred.

## 2021-02-21 NOTE — Progress Notes (Signed)
CC: KUQ mass Subjective: Main issue is migraine, some intermittent abd pain, no hematemesis or active GI bleed. Had 1 unit RBC hb did not change significantly  Objective: Vital signs in last 24 hours: Temp:  [98 F (36.7 C)-99.1 F (37.3 C)] 98 F (36.7 C) (09/07 0806) Pulse Rate:  [62-79] 62 (09/07 0806) Resp:  [18-20] 19 (09/07 0806) BP: (124-154)/(78-99) 124/80 (09/07 0806) SpO2:  [93 %-100 %] 100 % (09/07 0806) Weight:  [126.6 kg] 126.6 kg (09/06 1332)    Intake/Output from previous day: No intake/output data recorded. Intake/Output this shift: No intake/output data recorded.  Physical exam: In discomfort due to migraine ABd: soft, NT no peritonitis Ext: warm and well perfused  Lab Results: CBC  Recent Labs    02/20/21 1335 02/21/21 0619  WBC 7.5 6.6  HGB 7.0* 7.2*  HCT 23.3* 23.2*  PLT 325 296   BMET Recent Labs    02/20/21 1335 02/21/21 0619  NA 136 136  K 3.6 4.0  CL 97* 99  CO2 29 31  GLUCOSE 111* 119*  BUN 20 19  CREATININE 1.23 1.30*  CALCIUM 11.7* 10.9*   PT/INR Recent Labs    02/20/21 1731 02/21/21 0619  LABPROT 15.4* 15.4*  INR 1.2 1.2   ABG No results for input(s): PHART, HCO3 in the last 72 hours.  Invalid input(s): PCO2, PO2  Studies/Results: CT ABDOMEN PELVIS W CONTRAST  Result Date: 02/20/2021 CLINICAL DATA:  Bowel obstruction suspected EXAM: CT ABDOMEN AND PELVIS WITH CONTRAST TECHNIQUE: Multidetector CT imaging of the abdomen and pelvis was performed using the standard protocol following bolus administration of intravenous contrast. CONTRAST:  167m OMNIPAQUE IOHEXOL 350 MG/ML SOLN COMPARISON:  None. FINDINGS: Lower chest: Trace left pleural effusion. Hepatobiliary: No focal liver abnormality is seen. No gallstones, gallbladder wall thickening, or biliary dilatation. Pancreas: Pancreatic atrophy. Tail of the pancreas is located adjacent to a large left upper quadrant mass which is described below with no intervening fat plane.  Spleen/Stomach: Large heterogeneous mass of the left upper quadrant centered at the body of the stomach and spleen and involving the splenic artery and vein and tail of the pancreas. Mass measures approximately 12.0 x 11.4 cm in axial dimension. Air seen within the mass, likely due to fistulization with the stomach and/or necrosis. Small and large bowel: Normal in caliber with no evidence of wall thickening or obstruction. Normal appendix. Numerous colonic diverticula, most pronounced in the sigmoid colon. Adrenals/Urinary Tract: Adrenal glands are unremarkable. Kidneys are normal, without renal calculi, focal lesion, or hydronephrosis. Bladder is unremarkable. Vascular/Lymphatic: Enlarged periportal lymph nodes. Reference periportal lymph node measuring 1.4 cm in short axis located on series 2, image 23. Aortic atherosclerosis Reproductive: Prostate is unremarkable. Other: No abdominal wall hernia or abnormality. Trace free fluid in the pelvis. Musculoskeletal: No acute or significant osseous findings. IMPRESSION: Large heterogeneous mass of the left upper quadrant with central cavitation centered at the body of the stomach and spleen and involving the splenic artery, splenic vein and tail of the pancreas. Favor gastric or splenic primary neoplasm. Enlarged periportal lymph nodes, concerning for nodal metastatic disease. Trace left pleural effusion and trace free fluid in the pelvis. Aortic Atherosclerosis (ICD10-I70.0). Electronically Signed   By: LYetta GlassmanM.D.   On: 02/20/2021 16:21    Anti-infectives: Anti-infectives (From admission, onward)    None       Assessment/Plan:  LUQ mass not actively bleeding or causing any obstruction. Needs tissue dx and then staging. NO need for urgent  surgical intervention EGD today Sumatriptan [place] for migraine this am Pending oncology I spent 25 min in this encounter w > 50% spent in coordination and counseling of his care   Caroleen Hamman, MD,  [facility]  02/21/2021

## 2021-02-21 NOTE — Progress Notes (Signed)
Patient ID: Zachary Perkins, male   DOB: September 13, 1960, 60 y.o.   MRN: [id number]  PROGRESS NOTE    Zachary Perkins  [id number] DOB: May 31, 1961 DOA: 02/20/2021 PCP: Sharyne Peach, MD   Brief Narrative:  60 y.o. male with medical history significant of diabetes mellitus type II, hypertension, obesity, hypothyroidism presented with abdominal pain, nausea and constipation.  On presentation, hemoglobin was 7 (10.2 2 months ago in [place]).  CT of the abdomen showed  large heterogeneous mass in left upper quadrant with central cavitation centered in the body of the stomach and spleen involving the splenic artery splenic vein and tail of the pancreas favor gastric or splenic primary neoplasm.  Enlarged periportal lymph nodes concerning for nodal metastatic disease.  Trace left pleural effusion and trace free fluid in the pelvis.  GI and general surgery were consulted.  He was started on IV Protonix.  Assessment & Plan:   Gastric/splenic mass Possible GI bleed -CT of the abdomen showed  large heterogeneous mass in left upper quadrant with central cavitation centered in the body of the stomach and spleen involving the splenic artery splenic vein and tail of the pancreas favor gastric or splenic primary neoplasm.  Enlarged periportal lymph nodes concerning for nodal metastatic disease.  Trace left pleural effusion and trace free fluid in the pelvis. - GI and general surgery were consulted.  He was started on IV Protonix. -GI planning for EGD today.  Will need oncology evaluation once pathology is available.  Acute on chronic anemia/known iron deficiency anemia -Possibly from above.  Hemoglobin 7 on presentation, 10.2 2 months ago in [facility] -Status post 1 unit packed red cell transfusion.  Hemoglobin 7.2 this morning.  Diabetes mellitus type 2 -CBGs with SSI.  Check A1c in AM.  Hypertension -Blood pressure stable.  Antihypertensives on hold  Hypercalcemia -Possibly from cancer  and dehydration.  Improving.  Monitor  Hypothyroidism -Resume Synthroid once able to take orally  DVT prophylaxis: SCDs Code Status: Full Family Communication: Wife at bedside Disposition Plan: Status is: Inpatient  Remains inpatient appropriate because:Inpatient level of care appropriate due to severity of illness  Dispo: The patient is from: Home              Anticipated d/c is to: Home              Patient currently is not medically stable to d/c.   Difficult to place patient No  Consultants: GI/general surgery  Procedures: None  Antimicrobials: None   Subjective: Patient seen and examined at bedside.  Complains of intermittent headache and abdominal pain with nausea.  No fever or vomiting reported currently.  Objective: Vitals:   02/21/21 0000 02/21/21 0015 02/21/21 0349 02/21/21 0806  BP:   139/82 124/80  Pulse: 66 66 64 62  Resp:   18 19  Temp:    98 F (36.7 C)  TempSrc:    Oral  SpO2: 95% 95% 100% 100%  Weight:      Height:       No intake or output data in the 24 hours ending 02/21/21 1027 Filed Weights   02/20/21 1332  Weight: 126.6 kg    Examination:  General exam: Appears calm and comfortable.  Currently on room air. Respiratory system: Bilateral decreased breath sounds at bases Cardiovascular system: S1 & S2 heard, Rate controlled Gastrointestinal system: Abdomen is distended, soft and mildly tender in the left upper quadrant.  Normal bowel sounds heard. Extremities: No cyanosis, clubbing,  edema  Central nervous system: Alert and oriented. No focal neurological deficits. Moving extremities Skin: No rashes, lesions or ulcers Psychiatry: Affect is mostly flat.   Data Reviewed: I have personally reviewed following labs and imaging studies  CBC: Recent Labs  Lab 02/20/21 1335 02/21/21 0619  WBC 7.5 6.6  NEUTROABS  --  4.9  HGB 7.0* 7.2*  HCT 23.3* 23.2*  MCV 72.6* 75.3*  PLT 325 0000000   Basic Metabolic Panel: Recent Labs  Lab  02/20/21 1335 02/21/21 0619  NA 136 136  K 3.6 4.0  CL 97* 99  CO2 29 31  GLUCOSE 111* 119*  BUN 20 19  CREATININE 1.23 1.30*  CALCIUM 11.7* 10.9*   GFR: Estimated Creatinine Clearance: 88.9 mL/min (A) (by C-G formula based on SCr of 1.3 mg/dL (H)). Liver Function Tests: Recent Labs  Lab 02/20/21 1335 02/21/21 0619  AST 28 20  ALT 30 23  ALKPHOS 63 56  BILITOT 0.3 0.5  PROT 7.2 6.7  ALBUMIN 3.2* 3.0*   Recent Labs  Lab 02/20/21 1335  LIPASE 18   No results for input(s): AMMONIA in the last 168 hours. Coagulation Profile: Recent Labs  Lab 02/20/21 1731 02/21/21 0619  INR 1.2 1.2   Cardiac Enzymes: No results for input(s): CKTOTAL, CKMB, CKMBINDEX, TROPONINI in the last 168 hours. BNP (last 3 results) No results for input(s): PROBNP in the last 8760 hours. HbA1C: No results for input(s): HGBA1C in the last 72 hours. CBG: Recent Labs  Lab 02/21/21 0757  GLUCAP 127*   Lipid Profile: No results for input(s): CHOL, HDL, LDLCALC, TRIG, CHOLHDL, LDLDIRECT in the last 72 hours. Thyroid Function Tests: No results for input(s): TSH, T4TOTAL, FREET4, T3FREE, THYROIDAB in the last 72 hours. Anemia Panel: Recent Labs    02/20/21 1514 02/20/21 1731  VITAMINB12  --  3,011*  FOLATE  --  27.0  FERRITIN 58  --   TIBC 281  --   IRON 16*  --    Sepsis Labs: No results for input(s): PROCALCITON, LATICACIDVEN in the last 168 hours.  Recent Results (from the past 240 hour(s))  Resp Panel by RT-PCR (Flu A&B, Covid) Nasopharyngeal Swab     Status: None   Collection Time: 02/20/21  4:56 PM   Specimen: Nasopharyngeal Swab; Nasopharyngeal(NP) swabs in vial transport medium  Result Value Ref Range Status   SARS Coronavirus 2 by RT PCR NEGATIVE NEGATIVE Final    Comment: (NOTE) SARS-CoV-2 target nucleic acids are NOT DETECTED.  The SARS-CoV-2 RNA is generally detectable in upper respiratory specimens during the acute phase of infection. The lowest concentration of  SARS-CoV-2 viral copies this assay can detect is 138 copies/mL. A negative result does not preclude SARS-Cov-2 infection and should not be used as the sole basis for treatment or other patient management decisions. A negative result may occur with  improper specimen collection/handling, submission of specimen other than nasopharyngeal swab, presence of viral mutation(s) within the areas targeted by this assay, and inadequate number of viral copies(<138 copies/mL). A negative result must be combined with clinical observations, patient history, and epidemiological information. The expected result is Negative.  Fact Sheet for Patients:  EntrepreneurPulse.com.au  Fact Sheet for Healthcare Providers:  IncredibleEmployment.be  This test is no t yet approved or cleared by the [place] FDA and  has been authorized for detection and/or diagnosis of SARS-CoV-2 by FDA under an Emergency Use Authorization (EUA). This EUA will remain  in effect (meaning this test can be used) for  the duration of the COVID-19 declaration under Section 564(b)(1) of the Act, 21 U.S.C.section 360bbb-3(b)(1), unless the authorization is terminated  or revoked sooner.       Influenza A by PCR NEGATIVE NEGATIVE Final   Influenza B by PCR NEGATIVE NEGATIVE Final    Comment: (NOTE) The Xpert Xpress SARS-CoV-2/FLU/RSV plus assay is intended as an aid in the diagnosis of influenza from Nasopharyngeal swab specimens and should not be used as a sole basis for treatment. Nasal washings and aspirates are unacceptable for Xpert Xpress SARS-CoV-2/FLU/RSV testing.  Fact Sheet for Patients: EntrepreneurPulse.com.au  Fact Sheet for Healthcare Providers: IncredibleEmployment.be  This test is not yet approved or cleared by the [place] FDA and has been authorized for detection and/or diagnosis of SARS-CoV-2 by FDA under an Emergency Use  Authorization (EUA). This EUA will remain in effect (meaning this test can be used) for the duration of the COVID-19 declaration under Section 564(b)(1) of the Act, 21 U.S.C. section 360bbb-3(b)(1), unless the authorization is terminated or revoked.  Performed at [facility], [address] [place], [place] [address]          Radiology Studies: CT ABDOMEN PELVIS W CONTRAST  Result Date: 02/20/2021 CLINICAL DATA:  Bowel obstruction suspected EXAM: CT ABDOMEN AND PELVIS WITH CONTRAST TECHNIQUE: Multidetector CT imaging of the abdomen and pelvis was performed using the standard protocol following bolus administration of intravenous contrast. CONTRAST:  167m OMNIPAQUE IOHEXOL 350 MG/ML SOLN COMPARISON:  None. FINDINGS: Lower chest: Trace left pleural effusion. Hepatobiliary: No focal liver abnormality is seen. No gallstones, gallbladder wall thickening, or biliary dilatation. Pancreas: Pancreatic atrophy. Tail of the pancreas is located adjacent to a large left upper quadrant mass which is described below with no intervening fat plane. Spleen/Stomach: Large heterogeneous mass of the left upper quadrant centered at the body of the stomach and spleen and involving the splenic artery and vein and tail of the pancreas. Mass measures approximately 12.0 x 11.4 cm in axial dimension. Air seen within the mass, likely due to fistulization with the stomach and/or necrosis. Small and large bowel: Normal in caliber with no evidence of wall thickening or obstruction. Normal appendix. Numerous colonic diverticula, most pronounced in the sigmoid colon. Adrenals/Urinary Tract: Adrenal glands are unremarkable. Kidneys are normal, without renal calculi, focal lesion, or hydronephrosis. Bladder is unremarkable. Vascular/Lymphatic: Enlarged periportal lymph nodes. Reference periportal lymph node measuring 1.4 cm in short axis located on series 2, image 23. Aortic atherosclerosis Reproductive: Prostate is  unremarkable. Other: No abdominal wall hernia or abnormality. Trace free fluid in the pelvis. Musculoskeletal: No acute or significant osseous findings. IMPRESSION: Large heterogeneous mass of the left upper quadrant with central cavitation centered at the body of the stomach and spleen and involving the splenic artery, splenic vein and tail of the pancreas. Favor gastric or splenic primary neoplasm. Enlarged periportal lymph nodes, concerning for nodal metastatic disease. Trace left pleural effusion and trace free fluid in the pelvis. Aortic Atherosclerosis (ICD10-I70.0). Electronically Signed   By: LYetta GlassmanM.D.   On: 02/20/2021 16:21        Scheduled Meds:  insulin aspart  0-9 Units Subcutaneous TID WC   pantoprazole (PROTONIX) IV  40 mg Intravenous Q12H   SUMAtriptan  6 mg Subcutaneous Once   Continuous Infusions:  dextrose 5 % and 0.9% NaCl     dextrose 5 % and 0.9% NaCl 75 mL/hr at 02/21/21 0400          Lamontae Ricardo AStarla Link MD [facility]  02/21/2021, 10:27 AM

## 2021-02-21 NOTE — ED Notes (Signed)
Pt sleeping. 

## 2021-02-21 NOTE — ED Notes (Signed)
Lab in room to draw AM labs.

## 2021-02-21 NOTE — ED Notes (Signed)
Pt ambulatory to the bathroom without difficulty, pt sitting on side of the bed at this time, c/o migraine "for the past 7 hours"  Asked pt if he had received anything for the migraine and he said tylenol, pt states he takes excedrin at home. Pt also states "my gut is killing me"

## 2021-02-22 ENCOUNTER — Encounter: Payer: Self-pay | Admitting: Gastroenterology

## 2021-02-22 ENCOUNTER — Telehealth: Payer: Self-pay

## 2021-02-22 DIAGNOSIS — K3189 Other diseases of stomach and duodenum: Secondary | ICD-10-CM

## 2021-02-22 LAB — CBC WITH DIFFERENTIAL/PLATELET
Abs Immature Granulocytes: 0.03 10*3/uL (ref 0.00–0.07)
Basophils Absolute: 0 10*3/uL (ref 0.0–0.1)
Basophils Relative: 1 %
Eosinophils Absolute: 0 10*3/uL (ref 0.0–0.5)
Eosinophils Relative: 1 %
HCT: 22.2 % — ABNORMAL LOW (ref 39.0–52.0)
Hemoglobin: 6.7 g/dL — ABNORMAL LOW (ref 13.0–17.0)
Immature Granulocytes: 1 %
Lymphocytes Relative: 18 %
Lymphs Abs: 1.1 10*3/uL (ref 0.7–4.0)
MCH: 22.6 pg — ABNORMAL LOW (ref 26.0–34.0)
MCHC: 30.2 g/dL (ref 30.0–36.0)
MCV: 74.7 fL — ABNORMAL LOW (ref 80.0–100.0)
Monocytes Absolute: 0.7 10*3/uL (ref 0.1–1.0)
Monocytes Relative: 11 %
Neutro Abs: 4.3 10*3/uL (ref 1.7–7.7)
Neutrophils Relative %: 68 %
Platelets: 264 10*3/uL (ref 150–400)
RBC: 2.97 MIL/uL — ABNORMAL LOW (ref 4.22–5.81)
RDW: 17 % — ABNORMAL HIGH (ref 11.5–15.5)
WBC: 6.2 10*3/uL (ref 4.0–10.5)
nRBC: 0 % (ref 0.0–0.2)

## 2021-02-22 LAB — HEMOGLOBIN AND HEMATOCRIT, BLOOD
HCT: 22.7 % — ABNORMAL LOW (ref 39.0–52.0)
Hemoglobin: 7 g/dL — ABNORMAL LOW (ref 13.0–17.0)

## 2021-02-22 LAB — BASIC METABOLIC PANEL
Anion gap: 8 (ref 5–15)
BUN: 17 mg/dL (ref 6–20)
CO2: 29 mmol/L (ref 22–32)
Calcium: 10.1 mg/dL (ref 8.9–10.3)
Chloride: 100 mmol/L (ref 98–111)
Creatinine, Ser: 1.18 mg/dL (ref 0.61–1.24)
GFR, Estimated: >60 mL/min (ref >60)
Glucose, Bld: 108 mg/dL — ABNORMAL HIGH (ref 70–99)
Potassium: 3.8 mmol/L (ref 3.5–5.1)
Sodium: 137 mmol/L (ref 135–145)

## 2021-02-22 LAB — GLUCOSE, CAPILLARY
Glucose-Capillary: 109 mg/dL — ABNORMAL HIGH (ref 70–99)
Glucose-Capillary: 113 mg/dL — ABNORMAL HIGH (ref 70–99)
Glucose-Capillary: 103 mg/dL — ABNORMAL HIGH (ref 70–99)
Glucose-Capillary: 105 mg/dL — ABNORMAL HIGH (ref 70–99)

## 2021-02-22 LAB — RETIC PANEL
Immature Retic Fract: 30.3 % — ABNORMAL HIGH (ref 2.3–15.9)
RBC.: 3.02 MIL/uL — ABNORMAL LOW (ref 4.22–5.81)
Retic Count, Absolute: 46.5 10*3/uL (ref 19.0–186.0)
Retic Ct Pct: 1.5 % (ref 0.4–3.1)
Reticulocyte Hemoglobin: 20.6 pg — ABNORMAL LOW (ref >27.9)

## 2021-02-22 LAB — HEMOGLOBIN A1C
Hgb A1c MFr Bld: 5 % (ref 4.8–5.6)
Mean Plasma Glucose: 96.8 mg/dL

## 2021-02-22 LAB — PREPARE RBC (CROSSMATCH)

## 2021-02-22 LAB — MAGNESIUM: Magnesium: 1.6 mg/dL — ABNORMAL LOW (ref 1.7–2.4)

## 2021-02-22 MED ORDER — LEVOTHYROXINE SODIUM 100 MCG PO TABS
300.0000 ug | ORAL_TABLET | Freq: Every day | ORAL | Status: DC
  Administered 2021-02-23 – 2021-02-26 (×4): 300 ug via ORAL
  Filled 2021-02-22 (×4): qty 3

## 2021-02-22 MED ORDER — SODIUM CHLORIDE 0.9% IV SOLUTION: Freq: Once | INTRAVENOUS | Status: AC

## 2021-02-22 MED ORDER — MAGNESIUM SULFATE 2 GM/50ML IV SOLN
2.0000 g | Freq: Once | INTRAVENOUS | Status: AC
  Administered 2021-02-22: 2 g via INTRAVENOUS
  Filled 2021-02-22: qty 50

## 2021-02-22 NOTE — Progress Notes (Signed)
Order received from Dr Starla Link to discontinue IVF/D5 NS

## 2021-02-22 NOTE — Telephone Encounter (Signed)
Per [place] from Dr. Tasia Catchings: anticipate dc tmr. new gastric mass. need PET. please check and get authorization. as soon as he is discharged, please arrange.  also he will need to see me in person next week, for post hosp followup, lab cbc retic panel, MD IV venofer

## 2021-02-22 NOTE — Progress Notes (Signed)
Patient ID: Zachary Perkins, male   DOB: November 21, 1960, 60 y.o.   MRN: [id number]  PROGRESS NOTE    Zachary Perkins  [id number] DOB: 27-Sep-1960 DOA: 02/20/2021 PCP: Sharyne Peach, MD   Brief Narrative:  60 y.o. male with medical history significant of diabetes mellitus type II, hypertension, obesity, hypothyroidism presented with abdominal pain, nausea and constipation.  On presentation, hemoglobin was 7 (10.2 2 months ago in [place]).  CT of the abdomen showed  large heterogeneous mass in left upper quadrant with central cavitation centered in the body of the stomach and spleen involving the splenic artery splenic vein and tail of the pancreas favor gastric or splenic primary neoplasm.  Enlarged periportal lymph nodes concerning for nodal metastatic disease.  Trace left pleural effusion and trace free fluid in the pelvis.  GI and general surgery were consulted.  He was started on IV Protonix.  He underwent EGD on 02/21/2021.  Assessment & Plan:   Gastric/splenic mass Possible GI bleed -CT of the abdomen showed  large heterogeneous mass in left upper quadrant with central cavitation centered in the body of the stomach and spleen involving the splenic artery splenic vein and tail of the pancreas favor gastric or splenic primary neoplasm.  Enlarged periportal lymph nodes concerning for nodal metastatic disease.  Trace left pleural effusion and trace free fluid in the pelvis. - GI and general surgery were consulted.  He was started on IV Protonix. 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  Pathology pending.  GI signed off on 02/21/2021 and recommend Protonix IV twice daily while in the hospital. -Tolerating advanced diet.  DC IV fluids. -Oncology evaluation appreciated.  Await pathology.  Will need PET scan as an outpatient.  Acute on chronic anemia/known iron deficiency  anemia -Possibly from above.  Hemoglobin 7 on presentation, 10.2 2 months ago in Care Every[facility] -Status post 1 unit packed red cell transfusion.  Hemoglobin 6.7 this morning.  Transfuse 1 more unit of packed red cells.  Monitor H&H. -Patient also receiving IV iron as per oncology.  Hypomagnesemia -Replace.  Repeat a.m. labs  Diabetes mellitus type 2 -CBGs with SSI.  A1c pending.  Hypertension -Blood pressure stable.  Antihypertensives on hold  Hypercalcemia -Possibly from cancer and dehydration.  Calcium level pending this morning.  Hypothyroidism -Resume Synthroid  DVT prophylaxis: SCDs Code Status: Full Family Communication: Wife and friend at bedside Disposition Plan: Status is: Inpatient  Remains inpatient appropriate because:Inpatient level of care appropriate due to severity of illness  Dispo: The patient is from: Home              Anticipated d/c is to: Home in 1 to 2 days if remains medically stable              Patient currently is not medically stable to d/c.   Difficult to place patient No  Consultants: GI/general surgery  Procedures: None  Antimicrobials: None   Subjective: Patient seen and examined at bedside.  No overnight fever, vomiting reported.  Denies chest pain or worsening shortness of breath.  Still complains of intermittent nausea. Objective: Vitals:   02/21/21 2004 02/22/21 0525 02/22/21 0536 02/22/21 0755  BP: 118/75  129/77 137/78  Pulse: 72  68 68  Resp: '16  16 18  '$ Temp: 98.5 F (36.9 C)  (!) 97.4 F (36.3 C) 98.6 F (37 C)  TempSrc: Oral  Oral Oral  SpO2: 97%  97% 100%  Weight:  128 kg    Height:        Intake/Output Summary (Last 24 hours) at 02/22/2021 0807 Last data filed at 02/21/2021 1850 Gross per 24 hour  Intake 2068.5 ml  Output --  Net 2068.5 ml   Filed Weights   02/20/21 1332 02/21/21 1319 02/22/21 0525  Weight: 126.6 kg 127 kg 128 kg    Examination:  General exam: No distress.  Looks chronically ill and  deconditioned.  On room air currently. Respiratory system: Decreased breath sounds at bases bilaterally cardiovascular system: Rate controlled, S1-S2 heard gastrointestinal system: Abdomen is mildly distended, soft and mildly tender in the left upper quadrant.  Bowel sounds are heard extremities: No edema or clubbing Central nervous system: Awake and alert.  No focal neurological deficits.  Moves extremities  skin: No obvious ecchymosis/lesions Psychiatry: Flat affect  Data Reviewed: I have personally reviewed following labs and imaging studies  CBC: Recent Labs  Lab 02/20/21 1335 02/21/21 0619  WBC 7.5 6.6  NEUTROABS  --  4.9  HGB 7.0* 7.2*  HCT 23.3* 23.2*  MCV 72.6* 75.3*  PLT 325 0000000    Basic Metabolic Panel: Recent Labs  Lab 02/20/21 1335 02/21/21 0619  NA 136 136  K 3.6 4.0  CL 97* 99  CO2 29 31  GLUCOSE 111* 119*  BUN 20 19  CREATININE 1.23 1.30*  CALCIUM 11.7* 10.9*    GFR: Estimated Creatinine Clearance: 90.6 mL/min (A) (by C-G formula based on SCr of 1.3 mg/dL (H)). Liver Function Tests: Recent Labs  Lab 02/20/21 1335 02/21/21 0619  AST 28 20  ALT 30 23  ALKPHOS 63 56  BILITOT 0.3 0.5  PROT 7.2 6.7  ALBUMIN 3.2* 3.0*    Recent Labs  Lab 02/20/21 1335  LIPASE 18    No results for input(s): AMMONIA in the last 168 hours. Coagulation Profile: Recent Labs  Lab 02/20/21 1731 02/21/21 0619  INR 1.2 1.2    Cardiac Enzymes: No results for input(s): CKTOTAL, CKMB, CKMBINDEX, TROPONINI in the last 168 hours. BNP (last 3 results) No results for input(s): PROBNP in the last 8760 hours. HbA1C: Recent Labs    02/21/21 0619  HGBA1C 5.0   CBG: Recent Labs  Lab 02/21/21 1124 02/21/21 1324 02/21/21 1544 02/21/21 2110 02/22/21 0752  GLUCAP 130* 121* 122* 123* 109*    Lipid Profile: No results for input(s): CHOL, HDL, LDLCALC, TRIG, CHOLHDL, LDLDIRECT in the last 72 hours. Thyroid Function Tests: No results for input(s): TSH, T4TOTAL,  FREET4, T3FREE, THYROIDAB in the last 72 hours. Anemia Panel: Recent Labs    02/20/21 1514 02/20/21 1731  VITAMINB12  --  3,011*  FOLATE  --  27.0  FERRITIN 58  --   TIBC 281  --   IRON 16*  --     Sepsis Labs: No results for input(s): PROCALCITON, LATICACIDVEN in the last 168 hours.  Recent Results (from the past 240 hour(s))  Resp Panel by RT-PCR (Flu A&B, Covid) Nasopharyngeal Swab     Status: None   Collection Time: 02/20/21  4:56 PM   Specimen: Nasopharyngeal Swab; Nasopharyngeal(NP) swabs in vial transport medium  Result Value Ref Range Status   SARS Coronavirus 2 by RT PCR NEGATIVE NEGATIVE Final    Comment: (NOTE) SARS-CoV-2 target nucleic acids are NOT DETECTED.  The SARS-CoV-2 RNA is generally detectable in upper respiratory specimens during the acute phase of infection. The lowest concentration of SARS-CoV-2 viral  copies this assay can detect is 138 copies/mL. A negative result does not preclude SARS-Cov-2 infection and should not be used as the sole basis for treatment or other patient management decisions. A negative result may occur with  improper specimen collection/handling, submission of specimen other than nasopharyngeal swab, presence of viral mutation(s) within the areas targeted by this assay, and inadequate number of viral copies(<138 copies/mL). A negative result must be combined with clinical observations, patient history, and epidemiological information. The expected result is Negative.  Fact Sheet for Patients:  EntrepreneurPulse.com.au  Fact Sheet for Healthcare Providers:  IncredibleEmployment.be  This test is no t yet approved or cleared by the [place] FDA and  has been authorized for detection and/or diagnosis of SARS-CoV-2 by FDA under an Emergency Use Authorization (EUA). This EUA will remain  in effect (meaning this test can be used) for the duration of the COVID-19 declaration under Section  564(b)(1) of the Act, 21 U.S.C.section 360bbb-3(b)(1), unless the authorization is terminated  or revoked sooner.       Influenza A by PCR NEGATIVE NEGATIVE Final   Influenza B by PCR NEGATIVE NEGATIVE Final    Comment: (NOTE) The Xpert Xpress SARS-CoV-2/FLU/RSV plus assay is intended as an aid in the diagnosis of influenza from Nasopharyngeal swab specimens and should not be used as a sole basis for treatment. Nasal washings and aspirates are unacceptable for Xpert Xpress SARS-CoV-2/FLU/RSV testing.  Fact Sheet for Patients: EntrepreneurPulse.com.au  Fact Sheet for Healthcare Providers: IncredibleEmployment.be  This test is not yet approved or cleared by the [place] FDA and has been authorized for detection and/or diagnosis of SARS-CoV-2 by FDA under an Emergency Use Authorization (EUA). This EUA will remain in effect (meaning this test can be used) for the duration of the COVID-19 declaration under Section 564(b)(1) of the Act, 21 U.S.C. section 360bbb-3(b)(1), unless the authorization is terminated or revoked.  Performed at [facility], [address] [place], [place] [address]           Radiology Studies: CT ABDOMEN PELVIS W CONTRAST  Result Date: 02/20/2021 CLINICAL DATA:  Bowel obstruction suspected EXAM: CT ABDOMEN AND PELVIS WITH CONTRAST TECHNIQUE: Multidetector CT imaging of the abdomen and pelvis was performed using the standard protocol following bolus administration of intravenous contrast. CONTRAST:  127m OMNIPAQUE IOHEXOL 350 MG/ML SOLN COMPARISON:  None. FINDINGS: Lower chest: Trace left pleural effusion. Hepatobiliary: No focal liver abnormality is seen. No gallstones, gallbladder wall thickening, or biliary dilatation. Pancreas: Pancreatic atrophy. Tail of the pancreas is located adjacent to a large left upper quadrant mass which is described below with no intervening fat plane. Spleen/Stomach: Large  heterogeneous mass of the left upper quadrant centered at the body of the stomach and spleen and involving the splenic artery and vein and tail of the pancreas. Mass measures approximately 12.0 x 11.4 cm in axial dimension. Air seen within the mass, likely due to fistulization with the stomach and/or necrosis. Small and large bowel: Normal in caliber with no evidence of wall thickening or obstruction. Normal appendix. Numerous colonic diverticula, most pronounced in the sigmoid colon. Adrenals/Urinary Tract: Adrenal glands are unremarkable. Kidneys are normal, without renal calculi, focal lesion, or hydronephrosis. Bladder is unremarkable. Vascular/Lymphatic: Enlarged periportal lymph nodes. Reference periportal lymph node measuring 1.4 cm in short axis located on series 2, image 23. Aortic atherosclerosis Reproductive: Prostate is unremarkable. Other: No abdominal wall hernia or abnormality. Trace free fluid in the pelvis. Musculoskeletal: No acute or significant osseous findings. IMPRESSION: Large  heterogeneous mass of the left upper quadrant with central cavitation centered at the body of the stomach and spleen and involving the splenic artery, splenic vein and tail of the pancreas. Favor gastric or splenic primary neoplasm. Enlarged periportal lymph nodes, concerning for nodal metastatic disease. Trace left pleural effusion and trace free fluid in the pelvis. Aortic Atherosclerosis (ICD10-I70.0). Electronically Signed   By: Yetta Glassman M.D.   On: 02/20/2021 16:21        Scheduled Meds:  insulin aspart  0-9 Units Subcutaneous TID WC   pantoprazole (PROTONIX) IV  40 mg Intravenous Q12H   SUMAtriptan  6 mg Subcutaneous Once   Continuous Infusions:  dextrose 5 % and 0.9% NaCl 75 mL/hr at 02/21/21 1600   iron sucrose 200 mg (02/21/21 1623)          Aline August, MD [facility] 02/22/2021, 8:07 AM

## 2021-02-22 NOTE — TOC CM/SW Note (Signed)
Chart reviewed. PCP is Salome Holmes, MD. On room air. No wounds. No TOC needs identified at this time. Please consult if any needs arise.  Dayton Scrape, [place]

## 2021-02-22 NOTE — Telephone Encounter (Signed)
No auth required by plan per [place], W. Will schedule once pt is out of hospital

## 2021-02-22 NOTE — Progress Notes (Signed)
[facility] SURGICAL PROGRESS NOTE (cpt [phone])  Hospital Day(s): 2.   Post op day(s): 1 Day Post-Op.   Interval History: Patient seen and examined, no acute events or new complaints overnight. Patient reports his pain and nausea are better this morning after getting medications. He remains without leukocytosis to 6.2 this morning. Hgb down to 6.7. Renal function remains normal; sCr - 1.18; UO - unmeasured. Mild hypomagnesemia to 1.6 but otherwise no electrolyte derangements. Underwent EGD yesterday (09/07) which I have reviewed. Pathology is pending. Seen by oncology. Tolerating regular diet.  Review of Systems:  Constitutional: denies fever, chills  HEENT: denies cough or congestion  Respiratory: denies any shortness of breath  Cardiovascular: denies chest pain or palpitations  Gastrointestinal: denies abdominal pain, N/V, or diarrhea/and bowel function as per interval history Genitourinary: denies burning with urination or urinary frequency  Vital signs in last 24 hours: [min-max] current  Temp:  [96.7 F (35.9 C)-98.6 F (37 C)] 98.6 F (37 C) (09/08 0755) Pulse Rate:  [61-74] 68 (09/08 0755) Resp:  [12-21] 18 (09/08 0755) BP: (107-137)/(68-85) 137/78 (09/08 0755) SpO2:  [97 %-100 %] 100 % (09/08 0755) Weight:  [127 kg-128 kg] 128 kg (09/08 0525)     Height: '6\' 5"'$  (195.6 cm) Weight: 128 kg BMI (Calculated): 33.46   Intake/Output last 2 shifts:  09/07 0701 - 09/08 0700 In: 2068.5 [P.O.:360; I.V.:1598.5; IV Piggyback:110] Out: -    Physical Exam:  Constitutional: alert, cooperative and no distress  HENT: normocephalic without obvious abnormality  Eyes: PERRL, EOM's grossly intact and symmetric  Respiratory: breathing non-labored at rest  Cardiovascular: regular rate and sinus rhythm  Gastrointestinal: Soft, some LUQ tenderness, non-distended, no rebound/guarding  Musculoskeletal: no edema or wounds, motor and sensation grossly intact, NT    Labs:  CBC  Latest Ref Rng & Units 02/22/2021 02/21/2021 02/20/2021  WBC 4.0 - 10.5 K/uL 6.2 6.6 7.5  Hemoglobin 13.0 - 17.0 g/dL 6.7(L) 7.2(L) 7.0(L)  Hematocrit 39.0 - 52.0 % 22.2(L) 23.2(L) 23.3(L)  Platelets 150 - 400 K/uL 264 296 325   CMP Latest Ref Rng & Units 02/22/2021 02/21/2021 02/20/2021  Glucose 70 - 99 mg/dL 108(H) 119(H) 111(H)  BUN 6 - 20 mg/dL '17 19 20  '$ Creatinine 0.61 - 1.24 mg/dL 1.18 1.30(H) 1.23  Sodium 135 - 145 mmol/L 137 136 136  Potassium 3.5 - 5.1 mmol/L 3.8 4.0 3.6  Chloride 98 - 111 mmol/L 100 99 97(L)  CO2 22 - 32 mmol/L '29 31 29  '$ Calcium 8.9 - 10.3 mg/dL 10.1 10.9(H) 11.7(H)  Total Protein 6.5 - 8.1 g/dL - 6.7 7.2  Total Bilirubin 0.3 - 1.2 mg/dL - 0.5 0.3  Alkaline Phos 38 - 126 U/L - 56 63  AST 15 - 41 U/L - 20 28  ALT 0 - 44 U/L - 23 30     Imaging studies: No new pertinent imaging studies   Assessment/Plan: (ICD-10's: K53.89) 60 y.o. male with LUQ mass, likely malignant, without obstruction s/p EGD on 09/08   - Appreciate GI assistance; EGD reviewed; appreciate recommendation  - No emergent surgical interventions   - He will follow up in 1-2 weeks with Dr Dahlia Byes for port placement  - Further management per primary service; we will sign off but be available if needed    All of the above findings and recommendations were discussed with the patient, patient's family, and the medical team, and all of patient's and family's questions were answered to their expressed satisfaction.  -- Alroy Dust  Freda Jackson [facility] 02/22/2021, 9:14 AM [phone] M-F: 7am - 4pm

## 2021-02-23 ENCOUNTER — Inpatient Hospital Stay: Payer: BC Managed Care – PPO

## 2021-02-23 DIAGNOSIS — K5909 Other constipation: Secondary | ICD-10-CM

## 2021-02-23 DIAGNOSIS — D500 Iron deficiency anemia secondary to blood loss (chronic): Secondary | ICD-10-CM | POA: Diagnosis not present

## 2021-02-23 DIAGNOSIS — K5900 Constipation, unspecified: Secondary | ICD-10-CM | POA: Diagnosis not present

## 2021-02-23 DIAGNOSIS — R110 Nausea: Secondary | ICD-10-CM

## 2021-02-23 DIAGNOSIS — K3189 Other diseases of stomach and duodenum: Secondary | ICD-10-CM | POA: Diagnosis not present

## 2021-02-23 LAB — CBC WITH DIFFERENTIAL/PLATELET
Abs Immature Granulocytes: 0.04 10*3/uL (ref 0.00–0.07)
Basophils Absolute: 0 10*3/uL (ref 0.0–0.1)
Basophils Relative: 1 %
Eosinophils Absolute: 0 10*3/uL (ref 0.0–0.5)
Eosinophils Relative: 1 %
HCT: 26.3 % — ABNORMAL LOW (ref 39.0–52.0)
Hemoglobin: 8.1 g/dL — ABNORMAL LOW (ref 13.0–17.0)
Immature Granulocytes: 1 %
Lymphocytes Relative: 14 %
Lymphs Abs: 1 10*3/uL (ref 0.7–4.0)
MCH: 23.8 pg — ABNORMAL LOW (ref 26.0–34.0)
MCHC: 30.8 g/dL (ref 30.0–36.0)
MCV: 77.1 fL — ABNORMAL LOW (ref 80.0–100.0)
Monocytes Absolute: 0.8 10*3/uL (ref 0.1–1.0)
Monocytes Relative: 11 %
Neutro Abs: 5.4 10*3/uL (ref 1.7–7.7)
Neutrophils Relative %: 72 %
Platelets: 273 10*3/uL (ref 150–400)
RBC: 3.41 MIL/uL — ABNORMAL LOW (ref 4.22–5.81)
RDW: 17.4 % — ABNORMAL HIGH (ref 11.5–15.5)
WBC: 7.3 10*3/uL (ref 4.0–10.5)
nRBC: 0 % (ref 0.0–0.2)

## 2021-02-23 LAB — TYPE AND SCREEN
ABO/RH(D): O POS
Antibody Screen: NEGATIVE
Unit division: 0
Unit division: 0
Unit division: 0

## 2021-02-23 LAB — BASIC METABOLIC PANEL
Anion gap: 8 (ref 5–15)
BUN: 19 mg/dL (ref 6–20)
CO2: 28 mmol/L (ref 22–32)
Calcium: 10.8 mg/dL — ABNORMAL HIGH (ref 8.9–10.3)
Chloride: 100 mmol/L (ref 98–111)
Creatinine, Ser: 1.37 mg/dL — ABNORMAL HIGH (ref 0.61–1.24)
GFR, Estimated: 59 mL/min — ABNORMAL LOW (ref >60)
Glucose, Bld: 119 mg/dL — ABNORMAL HIGH (ref 70–99)
Potassium: 3.7 mmol/L (ref 3.5–5.1)
Sodium: 136 mmol/L (ref 135–145)

## 2021-02-23 LAB — BPAM RBC
Blood Product Expiration Date: 202210062359
Blood Product Expiration Date: 202210062359
Blood Product Expiration Date: 202210122359
ISSUE DATE / TIME: 202209061938
ISSUE DATE / TIME: 202209081102
ISSUE DATE / TIME: 202209081802
Unit Type and Rh: 5100
Unit Type and Rh: 5100
Unit Type and Rh: 5100

## 2021-02-23 LAB — GLUCOSE, CAPILLARY
Glucose-Capillary: 95 mg/dL (ref 70–99)
Glucose-Capillary: 98 mg/dL (ref 70–99)
Glucose-Capillary: 101 mg/dL — ABNORMAL HIGH (ref 70–99)
Glucose-Capillary: 132 mg/dL — ABNORMAL HIGH (ref 70–99)

## 2021-02-23 LAB — CEA: CEA: 1.1 ng/mL (ref 0.0–4.7)

## 2021-02-23 LAB — URIC ACID: Uric Acid, Serum: 6.8 mg/dL (ref 3.7–8.6)

## 2021-02-23 LAB — LACTATE DEHYDROGENASE: LDH: 206 U/L — ABNORMAL HIGH (ref 98–192)

## 2021-02-23 LAB — MAGNESIUM: Magnesium: 1.8 mg/dL (ref 1.7–2.4)

## 2021-02-23 MED ORDER — SENNOSIDES-DOCUSATE SODIUM 8.6-50 MG PO TABS
1.0000 | ORAL_TABLET | Freq: Two times a day (BID) | ORAL | Status: DC
  Administered 2021-02-23 – 2021-02-26 (×7): 1 via ORAL
  Filled 2021-02-23 (×7): qty 1

## 2021-02-23 MED ORDER — HYDROMORPHONE HCL 1 MG/ML IJ SOLN
1.0000 mg | INTRAMUSCULAR | Status: DC | PRN
  Administered 2021-02-23 (×2): 2 mg via INTRAVENOUS
  Administered 2021-02-24: 1 mg via INTRAVENOUS
  Administered 2021-02-25: 2 mg via INTRAVENOUS
  Administered 2021-02-26: 1 mg via INTRAVENOUS
  Filled 2021-02-23: qty 1
  Filled 2021-02-23: qty 2
  Filled 2021-02-23: qty 1
  Filled 2021-02-23 (×2): qty 2

## 2021-02-23 MED ORDER — PREDNISONE 20 MG PO TABS
50.0000 mg | ORAL_TABLET | Freq: Every day | ORAL | Status: AC
  Administered 2021-02-23 – 2021-02-24 (×2): 50 mg via ORAL
  Filled 2021-02-23 (×2): qty 3

## 2021-02-23 MED ORDER — ASPIRIN-ACETAMINOPHEN-CAFFEINE 250-250-65 MG PO TABS
1.0000 | ORAL_TABLET | Freq: Four times a day (QID) | ORAL | Status: DC | PRN
  Filled 2021-02-23 (×2): qty 1

## 2021-02-23 MED ORDER — BISACODYL 10 MG RE SUPP
10.0000 mg | Freq: Every day | RECTAL | Status: DC | PRN
  Administered 2021-02-23 – 2021-02-24 (×2): 10 mg via RECTAL
  Filled 2021-02-23 (×2): qty 1

## 2021-02-23 MED ORDER — POLYETHYLENE GLYCOL 3350 17 G PO PACK
17.0000 g | PACK | Freq: Every day | ORAL | Status: DC | PRN
Start: 2021-02-23 — End: 2021-02-26
  Administered 2021-02-24: 17 g via ORAL
  Filled 2021-02-23 (×2): qty 1

## 2021-02-23 NOTE — Progress Notes (Signed)
Patient received soap suds enema as ordered by Dr. Starla Link and tolerated well.

## 2021-02-23 NOTE — Progress Notes (Signed)
Patient ID: Zachary Perkins, male   DOB: 1961-05-08, 60 y.o.   MRN: [id number]  PROGRESS NOTE    Zachary Perkins  [id number] DOB: 08-Feb-1961 DOA: 02/20/2021 PCP: Sharyne Peach, MD   Brief Narrative:  60 y.o. male with medical history significant of diabetes mellitus type II, hypertension, obesity, hypothyroidism presented with abdominal pain, nausea and constipation.  On presentation, hemoglobin was 7 (10.2 2 months ago in [place]).  CT of the abdomen showed  large heterogeneous mass in left upper quadrant with central cavitation centered in the body of the stomach and spleen involving the splenic artery splenic vein and tail of the pancreas favor gastric or splenic primary neoplasm.  Enlarged periportal lymph nodes concerning for nodal metastatic disease.  Trace left pleural effusion and trace free fluid in the pelvis.  GI and general surgery were consulted.  He was started on IV Protonix.  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  Oncology was consulted.  Assessment & Plan:   Gastric/splenic mass Possible GI bleed -CT of the abdomen showed  large heterogeneous mass in left upper quadrant with central cavitation centered in the body of the stomach and spleen involving the splenic artery splenic vein and tail of the pancreas favor gastric or splenic primary neoplasm.  Enlarged periportal lymph nodes concerning for nodal metastatic disease.  Trace left pleural effusion and trace free fluid in the pelvis. - GI and general surgery were consulted.  He was started on IV Protonix. 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  Pathology pending.  GI signed off on 02/21/2021 and recommend Protonix IV twice daily while in the hospital. -Tolerating advanced diet.  Off IV fluids. -Oncology evaluation  appreciated.  Await pathology.  Will need PET scan as an outpatient. -Still having significant abdominal pain.  Increase dose of Dilaudid and continue oxycodone as needed.  Might need long-acting OxyContin if pain does not improve.  I have requested Dr. Grafton Folk to reevaluate the patient  Constipation -Still no bowel movement.  Patient apparently had tried laxatives including suppositories as an outpatient.  Will resume laxatives including suppositories and order as needed enema.  Patient currently hesitant to use enema.  Will check abdominal x-ray as well.  Patient currently not vomiting.  Acute on chronic anemia/known iron deficiency anemia -Possibly from above.  Hemoglobin 7 on presentation, 10.2 2 months ago in [facility] -Status post 3 units packed red cells transfusion since admission.  Hemoglobin 8.1 this morning. -Patient also receiving IV iron as per oncology. -Monitor H&H.  Hypomagnesemia -Improved.  Diabetes mellitus type 2 -CBGs with SSI.  A1c 5.  Hypertension -Blood pressure stable.  Antihypertensives on hold  Hypercalcemia -Possibly from cancer and dehydration.  Improving.  Outpatient follow-up with oncology.  Hypothyroidism -Continue Synthroid  DVT prophylaxis: SCDs Code Status: Full Family Communication: Wife at bedside Disposition Plan: Status is: Inpatient  Remains inpatient appropriate because:Inpatient level of care appropriate due to severity of illness  Dispo: The patient is from: Home              Anticipated d/c is to: Home in 1 to 2 days if remains medically stable and abdominal pain better controlled              Patient currently is not medically stable to d/c.   Difficult to place patient  No  Consultants: GI/general surgery  Procedures: None  Antimicrobials: None   Subjective: Patient seen and examined at bedside.  Still complains of's severe intermittent abdominal pain with constipation.  Intermittently nauseous but no vomiting.   Tolerating diet.  No fever or worsening shortness of breath reported.    Objective: Vitals:   02/22/21 2010 02/22/21 2144 02/23/21 0457 02/23/21 0813  BP: 140/88 (!) 157/93 131/63 (!) 147/88  Pulse: 70 67 64 67  Resp: '20 16 20 18  '$ Temp: 98.5 F (36.9 C) 99.5 F (37.5 C) 97.7 F (36.5 C) 98.1 F (36.7 C)  TempSrc: Oral Oral Oral Oral  SpO2: 97% 100% 97% 98%  Weight:      Height:        Intake/Output Summary (Last 24 hours) at 02/23/2021 1052 Last data filed at 02/23/2021 0545 Gross per 24 hour  Intake 1100 ml  Output 0 ml  Net 1100 ml    Filed Weights   02/21/21 1319 02/22/21 0525 02/22/21 1113  Weight: 127 kg 128 kg 127.4 kg    Examination:  General exam: Currently on room air.  No acute distress.  Looks chronically ill and deconditioned.   Respiratory system: Bilateral decreased breath sounds at bases cardiovascular system: S1-S2 heard, currently rate controlled gastrointestinal system: Abdomen is distended slightly, soft and mildly tender in the left upper quadrant.  Bowel sounds are heard extremities: No cyanosis or clubbing  Central nervous system: Alert and awake.  No focal neurological deficits.  Moving extremities  skin: No obvious petechiae/rashes Psychiatry: Affect is mostly flat but intermittently gets angry and upset.  Data Reviewed: I have personally reviewed following labs and imaging studies  CBC: Recent Labs  Lab 02/20/21 1335 02/21/21 0619 02/22/21 0746 02/22/21 1444 02/23/21 0045  WBC 7.5 6.6 6.2  --  7.3  NEUTROABS  --  4.9 4.3  --  5.4  HGB 7.0* 7.2* 6.7* 7.0* 8.1*  HCT 23.3* 23.2* 22.2* 22.7* 26.3*  MCV 72.6* 75.3* 74.7*  --  77.1*  PLT 325 296 264  --  123456    Basic Metabolic Panel: Recent Labs  Lab 02/20/21 1335 02/21/21 0619 02/22/21 0746 02/23/21 0045  NA 136 136 137 136  K 3.6 4.0 3.8 3.7  CL 97* 99 100 100  CO2 '29 31 29 28  '$ GLUCOSE 111* 119* 108* 119*  BUN '20 19 17 19  '$ CREATININE 1.23 1.30* 1.18 1.37*  CALCIUM 11.7* 10.9*  10.1 10.8*  MG  --   --  1.6* 1.8    GFR: Estimated Creatinine Clearance: 85.7 mL/min (A) (by C-G formula based on SCr of 1.37 mg/dL (H)). Liver Function Tests: Recent Labs  Lab 02/20/21 1335 02/21/21 0619  AST 28 20  ALT 30 23  ALKPHOS 63 56  BILITOT 0.3 0.5  PROT 7.2 6.7  ALBUMIN 3.2* 3.0*    Recent Labs  Lab 02/20/21 1335  LIPASE 18    No results for input(s): AMMONIA in the last 168 hours. Coagulation Profile: Recent Labs  Lab 02/20/21 1731 02/21/21 0619  INR 1.2 1.2    Cardiac Enzymes: No results for input(s): CKTOTAL, CKMB, CKMBINDEX, TROPONINI in the last 168 hours. BNP (last 3 results) No results for input(s): PROBNP in the last 8760 hours. HbA1C: Recent Labs    02/21/21 0619 02/22/21 0746  HGBA1C 5.0 5.0    CBG: Recent Labs  Lab 02/22/21 0752 02/22/21 1151 02/22/21 1718 02/22/21 2118 02/23/21 0814  GLUCAP 109* 113* 103* 105* 95    Lipid  Profile: No results for input(s): CHOL, HDL, LDLCALC, TRIG, CHOLHDL, LDLDIRECT in the last 72 hours. Thyroid Function Tests: No results for input(s): TSH, T4TOTAL, FREET4, T3FREE, THYROIDAB in the last 72 hours. Anemia Panel: Recent Labs    02/20/21 1514 02/20/21 1731 02/22/21 1444  VITAMINB12  --  3,011*  --   FOLATE  --  27.0  --   FERRITIN 58  --   --   TIBC 281  --   --   IRON 16*  --   --   RETICCTPCT  --   --  1.5    Sepsis Labs: No results for input(s): PROCALCITON, LATICACIDVEN in the last 168 hours.  Recent Results (from the past 240 hour(s))  Resp Panel by RT-PCR (Flu A&B, Covid) Nasopharyngeal Swab     Status: None   Collection Time: 02/20/21  4:56 PM   Specimen: Nasopharyngeal Swab; Nasopharyngeal(NP) swabs in vial transport medium  Result Value Ref Range Status   SARS Coronavirus 2 by RT PCR NEGATIVE NEGATIVE Final    Comment: (NOTE) SARS-CoV-2 target nucleic acids are NOT DETECTED.  The SARS-CoV-2 RNA is generally detectable in upper respiratory specimens during the acute  phase of infection. The lowest concentration of SARS-CoV-2 viral copies this assay can detect is 138 copies/mL. A negative result does not preclude SARS-Cov-2 infection and should not be used as the sole basis for treatment or other patient management decisions. A negative result may occur with  improper specimen collection/handling, submission of specimen other than nasopharyngeal swab, presence of viral mutation(s) within the areas targeted by this assay, and inadequate number of viral copies(<138 copies/mL). A negative result must be combined with clinical observations, patient history, and epidemiological information. The expected result is Negative.  Fact Sheet for Patients:  EntrepreneurPulse.com.au  Fact Sheet for Healthcare Providers:  IncredibleEmployment.be  This test is no t yet approved or cleared by the [place] FDA and  has been authorized for detection and/or diagnosis of SARS-CoV-2 by FDA under an Emergency Use Authorization (EUA). This EUA will remain  in effect (meaning this test can be used) for the duration of the COVID-19 declaration under Section 564(b)(1) of the Act, 21 U.S.C.section 360bbb-3(b)(1), unless the authorization is terminated  or revoked sooner.       Influenza A by PCR NEGATIVE NEGATIVE Final   Influenza B by PCR NEGATIVE NEGATIVE Final    Comment: (NOTE) The Xpert Xpress SARS-CoV-2/FLU/RSV plus assay is intended as an aid in the diagnosis of influenza from Nasopharyngeal swab specimens and should not be used as a sole basis for treatment. Nasal washings and aspirates are unacceptable for Xpert Xpress SARS-CoV-2/FLU/RSV testing.  Fact Sheet for Patients: EntrepreneurPulse.com.au  Fact Sheet for Healthcare Providers: IncredibleEmployment.be  This test is not yet approved or cleared by the [place] FDA and has been authorized for detection and/or diagnosis of  SARS-CoV-2 by FDA under an Emergency Use Authorization (EUA). This EUA will remain in effect (meaning this test can be used) for the duration of the COVID-19 declaration under Section 564(b)(1) of the Act, 21 U.S.C. section 360bbb-3(b)(1), unless the authorization is terminated or revoked.  Performed at [facility], [address] [place], [place] [address]           Radiology Studies: DG Abd 2 Views  Result Date: 02/23/2021 CLINICAL DATA:  Abdominal pain.  Constipation. EXAM: ABDOMEN - 2 VIEW COMPARISON:  None. FINDINGS: No abnormal bowel dilatation is noted. Moderate amount of stool seen throughout the colon. There  is no evidence of free air. No radio-opaque calculi or other significant radiographic abnormality is seen. IMPRESSION: Moderate stool burden.  No abnormal bowel dilatation is noted. Electronically Signed   By: Marijo Conception M.D.   On: 02/23/2021 10:01        Scheduled Meds:  insulin aspart  0-9 Units Subcutaneous TID WC   levothyroxine  300 mcg Oral Q0600   pantoprazole (PROTONIX) IV  40 mg Intravenous Q12H   senna-docusate  1 tablet Oral BID   SUMAtriptan  6 mg Subcutaneous Once   Continuous Infusions:  iron sucrose 200 mg (02/22/21 1635)          Aline August, MD [facility] 02/23/2021, 10:52 AM

## 2021-02-23 NOTE — Plan of Care (Signed)
Continuing with plan of care. 

## 2021-02-23 NOTE — Progress Notes (Signed)
Hematology/Oncology Progress Note [facility] Telephone:([phone] [phone]  Patient Care Team: Sharyne Peach, MD as PCP - General (Family Medicine) Earlie Server, MD as Consulting Physician (Hematology and Oncology)   Name of the patient: Zachary Perkins  [id number]  09-21-1960  Date of visit: 02/23/21   INTERVAL HISTORY-  Patient reports abdominal pain yesterday.  Constipation, had enema and had watery bowel movement. S/p 2 units of PRBCs    Review of systems- Review of Systems  Constitutional:  Positive for fatigue and unexpected weight change. Negative for appetite change, chills and fever.  HENT:   Negative for hearing loss and voice change.   Eyes:  Negative for eye problems and icterus.  Respiratory:  Negative for chest tightness, cough and shortness of breath.   Cardiovascular:  Negative for chest pain and leg swelling.  Gastrointestinal:  Positive for abdominal pain and constipation. Negative for abdominal distention.  Endocrine: Negative for hot flashes.  Genitourinary:  Negative for difficulty urinating, dysuria and frequency.   Musculoskeletal:  Negative for arthralgias.  Skin:  Negative for itching and rash.  Neurological:  Negative for light-headedness and numbness.  Hematological:  Negative for adenopathy. Does not bruise/bleed easily.  Psychiatric/Behavioral:  Negative for confusion.    Allergies  Allergen Reactions   Lisinopril Cough   Metformin And Related     PASSED OUT    Patient Active Problem List   Diagnosis Date Noted   Iron deficiency anemia due to chronic blood loss    Gastric mass 02/20/2021   Weight loss 02/20/2021   Acute on chronic anemia 02/20/2021   Diabetes mellitus type II, controlled ([place]) 02/20/2021   Nausea in adult 02/20/2021   Constipation 02/20/2021   Essential hypertension 02/20/2021   Hypothyroidism 02/20/2021     Past Medical History:  Diagnosis Date   Diabetes mellitus without  complication ([place])    Hypertension    Hypothyroidism    Morbid obesity ([place])      Past Surgical History:  Procedure Laterality Date   CARPAL TUNNEL RELEASE Left    ESOPHAGOGASTRODUODENOSCOPY (EGD) WITH PROPOFOL N/A 02/21/2021   Procedure: ESOPHAGOGASTRODUODENOSCOPY (EGD) WITH PROPOFOL;  Surgeon: Annamaria Helling, DO;  Location: [facility];  Service: Gastroenterology;  Laterality: N/A;  after 2pm   INSERTION OF MESH N/A 05/02/2017   Procedure: INSERTION OF MESH;  Surgeon: Leonie Green, MD;  Location: [facility];  Service: General;  Laterality: N/A;   UMBILICAL HERNIA REPAIR N/A 05/02/2017   Procedure: HERNIA REPAIR UMBILICAL ADULT;  Surgeon: Leonie Green, MD;  Location: [facility];  Service: General;  Laterality: N/A;    Social History   Socioeconomic History   Marital status: Married    Spouse name: Melonie   Number of children: 0   Years of education: 12   Highest education level: 12th grade  Occupational History   Not on file  Tobacco Use   Smoking status: Former    Packs/day: 1.00    Years: 20.00    Pack years: 20.00    Types: Cigarettes    Quit date: 04/25/2007    Years since quitting: 13.8   Smokeless tobacco: Never  Vaping Use   Vaping Use: Never used  Substance and Sexual Activity   Alcohol use: Yes    Comment: rare   Drug use: No   Sexual activity: Yes  Other Topics Concern   Not on file  Social History Narrative   Not on file   Social Determinants of Health  Financial Resource Strain: Not on file  Food Insecurity: Not on file  Transportation Needs: Not on file  Physical Activity: Not on file  Stress: Not on file  Social Connections: Not on file  Intimate Partner Violence: Not on file     History reviewed. No pertinent family history.   Current Facility-Administered Medications:    acetaminophen (TYLENOL) tablet 650 mg, 650 mg, Oral, Q6H PRN, 650 mg at 02/20/21 2044 **OR** acetaminophen (TYLENOL) suppository 650 mg, 650 mg,  Rectal, Q6H PRN, Howington, Einar Pheasant, MD   aspirin-acetaminophen-caffeine (EXCEDRIN MIGRAINE) per tablet 1 tablet, 1 tablet, Oral, Q6H PRN, Starla Link, Kshitiz, MD   bisacodyl (DULCOLAX) suppository 10 mg, 10 mg, Rectal, Daily PRN, Starla Link, Kshitiz, MD, 10 mg at 02/23/21 1338   HYDROmorphone (DILAUDID) injection 1-2 mg, 1-2 mg, Intravenous, Q2H PRN, Starla Link, Kshitiz, MD, 2 mg at 02/23/21 1021   insulin aspart (novoLOG) injection 0-9 Units, 0-9 Units, Subcutaneous, TID WC, Howington, Einar Pheasant, MD   iron sucrose (VENOFER) 200 mg in sodium chloride 0.9 % 100 mL IVPB, 200 mg, Intravenous, Daily, Earlie Server, MD, Last Rate: 440 mL/hr at 02/22/21 1635, 200 mg at 02/22/21 1635   levothyroxine (SYNTHROID) tablet 300 mcg, 300 mcg, Oral, Q0600, Starla Link, Kshitiz, MD, 300 mcg at 02/23/21 0457   ondansetron (ZOFRAN) injection 4 mg, 4 mg, Intravenous, Q6H PRN, Sharion Settler, NP, 4 mg at 02/23/21 1021   oxyCODONE (Oxy IR/ROXICODONE) immediate release tablet 5 mg, 5 mg, Oral, Q4H PRN, [place], Einar Pheasant, MD, 5 mg at 02/23/21 1339   pantoprazole (PROTONIX) injection 40 mg, 40 mg, Intravenous, Q12H, Howington, Einar Pheasant, MD, 40 mg at 02/23/21 1021   polyethylene glycol (MIRALAX / GLYCOLAX) packet 17 g, 17 g, Oral, Daily PRN, Starla Link, Kshitiz, MD   predniSONE (DELTASONE) tablet 50 mg, 50 mg, Oral, Daily, Earlie Server, MD   senna-docusate (Senokot-S) tablet 1 tablet, 1 tablet, Oral, BID, Starla Link, Kshitiz, MD, 1 tablet at 02/23/21 1021   SUMAtriptan (IMITREX) injection 6 mg, 6 mg, Subcutaneous, Once, Jules Husbands, MD   Physical exam:  Vitals:   02/22/21 2010 02/22/21 2144 02/23/21 0457 02/23/21 0813  BP: 140/88 (!) 157/93 131/63 (!) 147/88  Pulse: 70 67 64 67  Resp: 20 16 20 18   Temp: 98.5 F (36.9 C) 99.5 F (37.5 C) 97.7 F (36.5 C) 98.1 F (36.7 C)  TempSrc: Oral Oral Oral Oral  SpO2: 97% 100% 97% 98%  Weight:      Height:       Physical Exam Constitutional:      General: He is not in acute distress.    Appearance: He  is not diaphoretic.  HENT:     Head: Normocephalic and atraumatic.     Nose: Nose normal.     Mouth/Throat:     Pharynx: No oropharyngeal exudate.  Eyes:     General: No scleral icterus.    Pupils: Pupils are equal, round, and reactive to light.  Cardiovascular:     Rate and Rhythm: Normal rate and regular rhythm.     Heart sounds: No murmur heard. Pulmonary:     Effort: Pulmonary effort is normal. No respiratory distress.     Breath sounds: No rales.  Chest:     Chest wall: No tenderness.  Abdominal:     General: There is no distension.     Palpations: Abdomen is soft.     Tenderness: There is no abdominal tenderness.  Musculoskeletal:        General: Normal range of  motion.     Cervical back: Normal range of motion and neck supple.  Skin:    General: Skin is warm and dry.     Coloration: Skin is pale.     Findings: No erythema.  Neurological:     Mental Status: He is alert and oriented to person, place, and time.     Cranial Nerves: No cranial nerve deficit.     Motor: No abnormal muscle tone.     Coordination: Coordination normal.  Psychiatric:        Mood and Affect: Affect normal.    Labs. CBC    Component Value Date/Time   WBC 7.3 02/23/2021 0045   RBC 3.41 (L) 02/23/2021 0045   HGB 8.1 (L) 02/23/2021 0045   HCT 26.3 (L) 02/23/2021 0045   PLT 273 02/23/2021 0045   MCV 77.1 (L) 02/23/2021 0045   MCH 23.8 (L) 02/23/2021 0045   MCHC 30.8 02/23/2021 0045   RDW 17.4 (H) 02/23/2021 0045   LYMPHSABS 1.0 02/23/2021 0045   MONOABS 0.8 02/23/2021 0045   EOSABS 0.0 02/23/2021 0045   BASOSABS 0.0 02/23/2021 0045   CMP Latest Ref Rng & Units 02/23/2021 02/22/2021 02/21/2021  Glucose 70 - 99 mg/dL 119(H) 108(H) 119(H)  BUN 6 - 20 mg/dL 19 17 19   Creatinine 0.61 - 1.24 mg/dL 1.37(H) 1.18 1.30(H)  Sodium 135 - 145 mmol/L 136 137 136  Potassium 3.5 - 5.1 mmol/L 3.7 3.8 4.0  Chloride 98 - 111 mmol/L 100 100 99  CO2 22 - 32 mmol/L 28 29 31   Calcium 8.9 - 10.3 mg/dL 10.8(H)  10.1 10.9(H)  Total Protein 6.5 - 8.1 g/dL - - 6.7  Total Bilirubin 0.3 - 1.2 mg/dL - - 0.5  Alkaline Phos 38 - 126 U/L - - 56  AST 15 - 41 U/L - - 20  ALT 0 - 44 U/L - - 23      RADIOGRAPHIC STUDIES: I have personally reviewed the radiological images as listed and agreed with the findings in the report. CT ABDOMEN PELVIS W CONTRAST  Result Date: 02/20/2021 CLINICAL DATA:  Bowel obstruction suspected EXAM: CT ABDOMEN AND PELVIS WITH CONTRAST TECHNIQUE: Multidetector CT imaging of the abdomen and pelvis was performed using the standard protocol following bolus administration of intravenous contrast. CONTRAST:  147m OMNIPAQUE IOHEXOL 350 MG/ML SOLN COMPARISON:  None. FINDINGS: Lower chest: Trace left pleural effusion. Hepatobiliary: No focal liver abnormality is seen. No gallstones, gallbladder wall thickening, or biliary dilatation. Pancreas: Pancreatic atrophy. Tail of the pancreas is located adjacent to a large left upper quadrant mass which is described below with no intervening fat plane. Spleen/Stomach: Large heterogeneous mass of the left upper quadrant centered at the body of the stomach and spleen and involving the splenic artery and vein and tail of the pancreas. Mass measures approximately 12.0 x 11.4 cm in axial dimension. Air seen within the mass, likely due to fistulization with the stomach and/or necrosis. Small and large bowel: Normal in caliber with no evidence of wall thickening or obstruction. Normal appendix. Numerous colonic diverticula, most pronounced in the sigmoid colon. Adrenals/Urinary Tract: Adrenal glands are unremarkable. Kidneys are normal, without renal calculi, focal lesion, or hydronephrosis. Bladder is unremarkable. Vascular/Lymphatic: Enlarged periportal lymph nodes. Reference periportal lymph node measuring 1.4 cm in short axis located on series 2, image 23. Aortic atherosclerosis Reproductive: Prostate is unremarkable. Other: No abdominal wall hernia or abnormality.  Trace free fluid in the pelvis. Musculoskeletal: No acute or significant osseous findings. IMPRESSION: Large  heterogeneous mass of the left upper quadrant with central cavitation centered at the body of the stomach and spleen and involving the splenic artery, splenic vein and tail of the pancreas. Favor gastric or splenic primary neoplasm. Enlarged periportal lymph nodes, concerning for nodal metastatic disease. Trace left pleural effusion and trace free fluid in the pelvis. Aortic Atherosclerosis (ICD10-I70.0). Electronically Signed   By: Yetta Glassman M.D.   On: 02/20/2021 16:21   DG Abd 2 Views  Result Date: 02/23/2021 CLINICAL DATA:  Abdominal pain.  Constipation. EXAM: ABDOMEN - 2 VIEW COMPARISON:  None. FINDINGS: No abnormal bowel dilatation is noted. Moderate amount of stool seen throughout the colon. There is no evidence of free air. No radio-opaque calculi or other significant radiographic abnormality is seen. IMPRESSION: Moderate stool burden.  No abnormal bowel dilatation is noted. Electronically Signed   By: Marijo Conception M.D.   On: 02/23/2021 10:01    Assessment and plan-   #Large gastric mass  periportal lymph node enlargement. -02/21/2021 EGD showed large fungating ulcerated noncircumferential mass with no bleeding and no stigmata of recent bleeding was found on the greater curvature of the stomach.  Biopsy was taken.  Large amount of necrotic and ulcerated appearing tissue.  Acute duodenitis.  Gastritis Awaiting for pathology report. 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  He understands that the result is not finalized yet and subjected to change. He has constitutional symptoms including unintentional weight loss, fatigue, night sweats.  Will obtain bone marrow biopsy for further evaluation. Since he is very symptomatic, recommend prednisone 50 mg daily for 2 doses-   Ordered. Continue PPI, antiemetics  Abdominal pain, patient is on oxycodone 5 mg every 6 4 hours as needed.  Dilaudid 1 to 2 mg IV every 2 hours as needed.  #Constipation, continue current bowel regimen. #Severe iron deficiency anemia. due to underlying malignancy. IV Venofer daily x3.  Status post PRBCs transfusion. Hemoglobin has improved appropriately.  Continue monitoring.   Above plan was discussed with Dr. Starla Link via secure chat. Thank you for allowing me to participate in the care of this patient.   Earlie Server, MD, PhD Hematology Oncology [place] at [facility]  02/23/2021

## 2021-02-23 NOTE — Progress Notes (Signed)
[facility] Inpatient Follow-up/Progress Note   Patient ID: Zachary Perkins is a 60 y.o. male.  Overnight Events / Subjective Findings Currently resting in bed with wife at bedside. Tolerating ice pop during evaluation. Discussed pathology findings thus far. Pt having issues with constipation. Has received several different therapeutic interventions with some result at time of visit. Still with abdominal discomfort. KUB performed today. No vomiting. No other acute gi complaints  Review of Systems  Constitutional:  Positive for appetite change and unexpected weight change. Negative for activity change, chills, fatigue and fever.  HENT:  Negative for trouble swallowing.   Respiratory:  Negative for shortness of breath.   Cardiovascular:  Negative for chest pain and palpitations.  Gastrointestinal:  Positive for abdominal distention, abdominal pain, constipation and nausea. Negative for anal bleeding, blood in stool, diarrhea and vomiting.  Skin:  Positive for pallor. Negative for color change.  Neurological:  Negative for dizziness, syncope and weakness.  Psychiatric/Behavioral:  Negative for agitation and confusion. The patient is not nervous/anxious.   All other systems reviewed and are negative.   Medications  Current Facility-Administered Medications:    acetaminophen (TYLENOL) tablet 650 mg, 650 mg, Oral, Q6H PRN, 650 mg at 02/20/21 2044 **OR** acetaminophen (TYLENOL) suppository 650 mg, 650 mg, Rectal, Q6H PRN, [place], Einar Pheasant, MD   aspirin-acetaminophen-caffeine (EXCEDRIN MIGRAINE) per tablet 1 tablet, 1 tablet, Oral, Q6H PRN, Starla Link, Kshitiz, MD   bisacodyl (DULCOLAX) suppository 10 mg, 10 mg, Rectal, Daily PRN, Starla Link, Kshitiz, MD, 10 mg at 02/23/21 1338   HYDROmorphone (DILAUDID) injection 1-2 mg, 1-2 mg, Intravenous, Q2H PRN, Starla Link, Kshitiz, MD, 2 mg at 02/23/21 1021   insulin aspart (novoLOG) injection 0-9 Units, 0-9 Units, Subcutaneous, TID WC, [place], Einar Pheasant, MD   iron sucrose (VENOFER) 200 mg in sodium chloride 0.9 % 100 mL IVPB, 200 mg, Intravenous, Daily, Earlie Server, MD, Last Rate: 440 mL/hr at 02/22/21 1635, 200 mg at 02/22/21 1635   levothyroxine (SYNTHROID) tablet 300 mcg, 300 mcg, Oral, Q0600, Starla Link, Kshitiz, MD, 300 mcg at 02/23/21 0457   ondansetron (ZOFRAN) injection 4 mg, 4 mg, Intravenous, Q6H PRN, Sharion Settler, NP, 4 mg at 02/23/21 1021   oxyCODONE (Oxy IR/ROXICODONE) immediate release tablet 5 mg, 5 mg, Oral, Q4H PRN, [place], Einar Pheasant, MD, 5 mg at 02/23/21 1339   pantoprazole (PROTONIX) injection 40 mg, 40 mg, Intravenous, Q12H, [place], Einar Pheasant, MD, 40 mg at 02/23/21 1021   polyethylene glycol (MIRALAX / GLYCOLAX) packet 17 g, 17 g, Oral, Daily PRN, Starla Link, Kshitiz, MD   predniSONE (DELTASONE) tablet 50 mg, 50 mg, Oral, Daily, Earlie Server, MD   senna-docusate (Senokot-S) tablet 1 tablet, 1 tablet, Oral, BID, Alekh, Kshitiz, MD, 1 tablet at 02/23/21 1021   SUMAtriptan (IMITREX) injection 6 mg, 6 mg, Subcutaneous, Once, [place]on, [place] F, MD  iron sucrose 200 mg (02/22/21 1635)    acetaminophen **OR** acetaminophen, aspirin-acetaminophen-caffeine, bisacodyl, HYDROmorphone (DILAUDID) injection, ondansetron (ZOFRAN) IV, oxyCODONE, polyethylene glycol   Objective    Vitals:   02/22/21 2010 02/22/21 2144 02/23/21 0457 02/23/21 0813  BP: 140/88 (!) 157/93 131/63 (!) 147/88  Pulse: 70 67 64 67  Resp: '20 16 20 18  '$ Temp: 98.5 F (36.9 C) 99.5 F (37.5 C) 97.7 F (36.5 C) 98.1 F (36.7 C)  TempSrc: Oral Oral Oral Oral  SpO2: 97% 100% 97% 98%  Weight:      Height:         Physical Exam Vitals and nursing note reviewed.  Constitutional:  General: He is not in acute distress.    Appearance: Normal appearance. He is obese. He is not toxic-appearing or diaphoretic.  HENT:     Head: Normocephalic and atraumatic.     Nose: Nose normal.     Mouth/Throat:     Mouth: Mucous membranes are moist.     Pharynx: Oropharynx is clear.   Eyes:     General: No scleral icterus.    Extraocular Movements: Extraocular movements intact.  Cardiovascular:     Rate and Rhythm: Normal rate and regular rhythm.     Heart sounds: Normal heart sounds. No murmur heard.   No friction rub. No gallop.  Pulmonary:     Effort: Pulmonary effort is normal. No respiratory distress.     Breath sounds: Normal breath sounds. No wheezing, rhonchi or rales.  Abdominal:     General: Abdomen is protuberant. Bowel sounds are normal. There is no distension.     Palpations: Abdomen is soft.     Tenderness: There is abdominal tenderness in the left upper quadrant. There is no guarding or rebound.     Comments: No rigidity, non peritoneal  Musculoskeletal:     Cervical back: Neck supple.     Right lower leg: No edema.     Left lower leg: No edema.  Skin:    General: Skin is warm and dry.     Coloration: Skin is pale. Skin is not jaundiced.  Neurological:     General: No focal deficit present.     Mental Status: He is alert and oriented to person, place, and time. Mental status is at baseline.  Psychiatric:        Mood and Affect: Mood normal.        Behavior: Behavior normal.        Thought Content: Thought content normal.        Judgment: Judgment normal.     Laboratory Data Recent Labs  Lab 02/21/21 0619 02/22/21 0746 02/22/21 1444 02/23/21 0045  WBC 6.6 6.2  --  7.3  HGB 7.2* 6.7* 7.0* 8.1*  HCT 23.2* 22.2* 22.7* 26.3*  PLT 296 264  --  273  NEUTOPHILPCT 74 68  --  72  LYMPHOPCT 14 18  --  14  MONOPCT 10 11  --  11  EOSPCT 1 1  --  1   Recent Labs  Lab 02/20/21 1335 02/21/21 0619 02/22/21 0746 02/23/21 0045  NA 136 136 137 136  K 3.6 4.0 3.8 3.7  CL 97* 99 100 100  CO2 '29 31 29 28  '$ BUN '20 19 17 19  '$ CREATININE 1.23 1.30* 1.18 1.37*  CALCIUM 11.7* 10.9* 10.1 10.8*  PROT 7.2 6.7  --   --   BILITOT 0.3 0.5  --   --   ALKPHOS 63 56  --   --   ALT 30 23  --   --   AST 28 20  --   --   GLUCOSE 111* 119* 108* 119*    Recent Labs  Lab 02/20/21 1731 02/21/21 0619  INR 1.2 1.2     Imaging Studies: Stool burden noted w/o colonic dilation.  DG Abd 2 Views  Result Date: 02/23/2021 CLINICAL DATA:  Abdominal pain.  Constipation. EXAM: ABDOMEN - 2 VIEW COMPARISON:  None. FINDINGS: No abnormal bowel dilatation is noted. Moderate amount of stool seen throughout the colon. There is no evidence of free air. No radio-opaque calculi or other significant radiographic abnormality is seen. IMPRESSION: Moderate  stool burden.  No abnormal bowel dilatation is noted. Electronically Signed   By: Marijo Conception M.D.   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  -Discussed with the patient and his wife at bedside  -Oncology currently following the patient  - gastric mass-  - 12.4x11.4 cm             - potential necrosis vs fistulization             - suspect nodal involvement             - splenic vein/artery involvement and distal tail of pancreas # Constipation # abdominal pain # anemia, requiring transfusions  - s/p 3 uprbc during hospitalization, now 8.1 hgb # abnormal weight loss # nausea w/o vomiting # worsening fatigue and generalized weakness # dyspnea on exertion # diverticulosis             - noted on imaging # obesity # dm2 # htn  Plan:  -  pt has attempted enemas, suppositories and miralax with some effect; unfortunately, mag citrate is on national recall - as he just received many of these items today- recommend to assess effect by tomorrow; if no success, start go lytely prep- does not necessarily have to be completed and patient can sip slow, but would be useful in helping with bowel cleanse. Stimulant laxatives may cause discomfort  - can consider transitioning to protonix 40 mg po bid  - Pain will be difficult to control given this patient's malignancy and other organ involvement. Judicious use of opioids as this may worsen his constipation and  contribute to increased abdominal pain as well. Can also consider other medications such as gabapentin, lyrica, or tramadol; Can consider palliative medicine involvement from pain control perspective - Small frequent meals - continue to monitor H&H; Transfusion and resuscitation as per primary team. - pending bone marrow bx per patient - awaiting final pathology- - discussed with patient and wife pathology results thus far, but informed part of this is still pending. 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  - supportive care such as anti emetics per primary team  - GI to sign off, available as needed  I personally performed the service.  Management of other medical comorbidities as per primary team  Thank you for allowing [place] to participate in this patient's care. Please don't hesitate to call if any questions or concerns arise.   Annamaria Helling, DO [facility]  Portions of the record may have been created with voice recognition software. Occasional wrong-word or 'sound-a-like' substitutions may have occurred due to the inherent limitations of voice recognition software.  Read the chart carefully and recognize, using context, where substitutions may have occurred.

## 2021-02-23 NOTE — Anesthesia Postprocedure Evaluation (Signed)
Anesthesia Post Note  Patient: Nicole Kindred Gene Brandi  Procedure(s) Performed: ESOPHAGOGASTRODUODENOSCOPY (EGD) WITH PROPOFOL  Patient location during evaluation: Endoscopy Anesthesia Type: General Level of consciousness: awake and alert Pain management: pain level controlled Vital Signs Assessment: post-procedure vital signs reviewed and stable Respiratory status: spontaneous breathing, nonlabored ventilation, respiratory function stable and patient connected to nasal cannula oxygen Cardiovascular status: blood pressure returned to baseline and stable Postop Assessment: no apparent nausea or vomiting Anesthetic complications: no   No notable events documented.   Last Vitals:  Vitals:   02/22/21 2144 02/23/21 0457  BP: (!) 157/93 131/63  Pulse: 67 64  Resp: 16 20  Temp: 37.5 C 36.5 C  SpO2: 100% 97%    Last Pain:  Vitals:   02/23/21 0457  TempSrc: Oral  PainSc:                  Precious Haws Dilon Lank

## 2021-02-24 DIAGNOSIS — C8598 Non-Hodgkin lymphoma, unspecified, lymph nodes of multiple sites: Secondary | ICD-10-CM

## 2021-02-24 DIAGNOSIS — D649 Anemia, unspecified: Secondary | ICD-10-CM | POA: Diagnosis not present

## 2021-02-24 DIAGNOSIS — K3189 Other diseases of stomach and duodenum: Secondary | ICD-10-CM | POA: Diagnosis not present

## 2021-02-24 DIAGNOSIS — D500 Iron deficiency anemia secondary to blood loss (chronic): Secondary | ICD-10-CM | POA: Diagnosis not present

## 2021-02-24 LAB — CBC WITH DIFFERENTIAL/PLATELET
Abs Immature Granulocytes: 0.02 10*3/uL (ref 0.00–0.07)
Basophils Absolute: 0 10*3/uL (ref 0.0–0.1)
Basophils Relative: 0 %
Eosinophils Absolute: 0 10*3/uL (ref 0.0–0.5)
Eosinophils Relative: 0 %
HCT: 27.6 % — ABNORMAL LOW (ref 39.0–52.0)
Hemoglobin: 8.5 g/dL — ABNORMAL LOW (ref 13.0–17.0)
Immature Granulocytes: 0 %
Lymphocytes Relative: 7 %
Lymphs Abs: 0.5 10*3/uL — ABNORMAL LOW (ref 0.7–4.0)
MCH: 23.5 pg — ABNORMAL LOW (ref 26.0–34.0)
MCHC: 30.8 g/dL (ref 30.0–36.0)
MCV: 76.5 fL — ABNORMAL LOW (ref 80.0–100.0)
Monocytes Absolute: 0.1 10*3/uL (ref 0.1–1.0)
Monocytes Relative: 2 %
Neutro Abs: 6.6 10*3/uL (ref 1.7–7.7)
Neutrophils Relative %: 91 %
Platelets: 297 10*3/uL (ref 150–400)
RBC: 3.61 MIL/uL — ABNORMAL LOW (ref 4.22–5.81)
RDW: 17.7 % — ABNORMAL HIGH (ref 11.5–15.5)
WBC: 7.3 10*3/uL (ref 4.0–10.5)
nRBC: 0 % (ref 0.0–0.2)

## 2021-02-24 LAB — BASIC METABOLIC PANEL
Anion gap: 8 (ref 5–15)
BUN: 18 mg/dL (ref 6–20)
CO2: 28 mmol/L (ref 22–32)
Calcium: 9.8 mg/dL (ref 8.9–10.3)
Chloride: 100 mmol/L (ref 98–111)
Creatinine, Ser: 1.1 mg/dL (ref 0.61–1.24)
GFR, Estimated: >60 mL/min (ref >60)
Glucose, Bld: 161 mg/dL — ABNORMAL HIGH (ref 70–99)
Potassium: 4.1 mmol/L (ref 3.5–5.1)
Sodium: 136 mmol/L (ref 135–145)

## 2021-02-24 LAB — GLUCOSE, CAPILLARY
Glucose-Capillary: 183 mg/dL — ABNORMAL HIGH (ref 70–99)
Glucose-Capillary: 200 mg/dL — ABNORMAL HIGH (ref 70–99)
Glucose-Capillary: 163 mg/dL — ABNORMAL HIGH (ref 70–99)
Glucose-Capillary: 173 mg/dL — ABNORMAL HIGH (ref 70–99)

## 2021-02-24 LAB — MAGNESIUM: Magnesium: 1.8 mg/dL (ref 1.7–2.4)

## 2021-02-24 MED ORDER — PANTOPRAZOLE SODIUM 40 MG PO TBEC
40.0000 mg | DELAYED_RELEASE_TABLET | Freq: Two times a day (BID) | ORAL | Status: DC
  Administered 2021-02-24 – 2021-02-26 (×5): 40 mg via ORAL
  Filled 2021-02-24 (×6): qty 1

## 2021-02-24 NOTE — Plan of Care (Signed)
Continuing with plan of care. 

## 2021-02-24 NOTE — Progress Notes (Signed)
Patient ID: Zachary Perkins, male   DOB: Aug 06, 1960, 60 y.o.   MRN: [id number]  PROGRESS NOTE    Aaryan Essman  SLH:[id number] DOB: [birth year] DOA: 02/20/2021 PCP: Sharyne Peach, MD   Brief Narrative:  60 y.o. male with medical history significant of diabetes mellitus type II, hypertension, obesity, hypothyroidism presented with abdominal pain, nausea and constipation.  On presentation, hemoglobin was 7 (10.2 2 months ago in [place]).  CT of the abdomen showed  large heterogeneous mass in left upper quadrant with central cavitation centered in the body of the stomach and spleen involving the splenic artery splenic vein and tail of the pancreas favor gastric or splenic primary neoplasm.  Enlarged periportal lymph nodes concerning for nodal metastatic disease.  Trace left pleural effusion and trace free fluid in the pelvis.  GI and general surgery were consulted.  He was started on IV Protonix.  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  Oncology was consulted.  Assessment & Plan:   Gastric/splenic mass Possible GI bleed -CT of the abdomen showed  large heterogeneous mass in left upper quadrant with central cavitation centered in the body of the stomach and spleen involving the splenic artery splenic vein and tail of the pancreas favor gastric or splenic primary neoplasm.  Enlarged periportal lymph nodes concerning for nodal metastatic disease.  Trace left pleural effusion and trace free fluid in the pelvis. 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   -Tolerating advanced diet.  Off IV fluids. -Abdominal pain is improving after having bowel movement on 02/23/2021. 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  He has been started on  oral prednisone by oncology. -GI reevaluated the patient on 02/23/2021.  We will switch Protonix to oral twice a day as per GI recommendations.  GI signed off again on 02/23/2021.  Constipation -Abdominal x-ray on 02/23/2021 showed moderate constipation.  Continue laxatives.  Received enema on 02/23/2021 and had a bowel movement.  Acute on chronic anemia/known iron deficiency anemia -Possibly from above.  Hemoglobin 7 on presentation, 10.2 2 months ago in [facility] -Status post 3 units packed red cells transfusion since admission.  Hemoglobin 8.5 this morning. -Received IV iron as per oncology as well. -Monitor H&H.  Hypomagnesemia -Improved.  Diabetes mellitus type 2 -CBGs with SSI.  A1c 5.  Hypertension -Blood pressure stable.  Antihypertensives on hold  Hypercalcemia -Possibly from cancer and dehydration.  Improved.  Hypothyroidism -Continue Synthroid  DVT prophylaxis: SCDs Code Status: Full Family Communication: Wife at bedside Disposition Plan: Status is: Inpatient  Remains inpatient appropriate because:Inpatient level of care appropriate due to severity of illness  Dispo: The patient is from: Home              Anticipated d/c is to: Home               Patient currently is not medically stable to d/c.   Difficult to place patient No  Consultants: GI/general surgery  Procedures: EGD on 02/21/2021 Antimicrobials: None   Subjective: Patient seen and examined at bedside.  Still complains of intermittent abdominal pain requiring IV Dilaudid but improved compared to yesterday.  Had bowel movement yesterday.  No overnight fever or vomiting or shortness of breath reported. Objective: Vitals:   02/23/21 1939  02/24/21 0438 02/24/21 0459 02/24/21 0726  BP: 124/82 135/81  132/86  Pulse: 66 62  62  Resp: 20 20  20   Temp: 98.3 F (36.8 C) 97.9 F (36.6 C)  97.6 F (36.4 C)  TempSrc: Oral Oral  Oral  SpO2: 95% 98%  97%  Weight:  (P) 129.5 kg 129.5 kg   Height:         Intake/Output Summary (Last 24 hours) at 02/24/2021 0804 Last data filed at 02/24/2021 0542 Gross per 24 hour  Intake 480 ml  Output 0 ml  Net 480 ml    Filed Weights   02/22/21 1113 02/24/21 0438 02/24/21 0459  Weight: 127.4 kg (P) 129.5 kg 129.5 kg    Examination:  General exam: No distress.  On room air currently.  Looks chronically ill and deconditioned.   Respiratory system: Decreased breath sounds at bases bilaterally cardiovascular system: Rate controlled; S1-S2 heard gastrointestinal system: Abdomen is mildly distended, soft and mildly tender in the left upper quadrant.  Normal bowel sounds heard extremities: No clubbing or edema  Data Reviewed: I have personally reviewed following labs and imaging studies  CBC: Recent Labs  Lab 02/20/21 1335 02/21/21 0619 02/22/21 0746 02/22/21 1444 02/23/21 0045 02/24/21 0436  WBC 7.5 6.6 6.2  --  7.3 7.3  NEUTROABS  --  4.9 4.3  --  5.4 6.6  HGB 7.0* 7.2* 6.7* 7.0* 8.1* 8.5*  HCT 23.3* 23.2* 22.2* 22.7* 26.3* 27.6*  MCV 72.6* 75.3* 74.7*  --  77.1* 76.5*  PLT 325 296 264  --  273 382    Basic Metabolic Panel: Recent Labs  Lab 02/20/21 1335 02/21/21 0619 02/22/21 0746 02/23/21 0045 02/24/21 0436  NA 136 136 137 136 136  K 3.6 4.0 3.8 3.7 4.1  CL 97* 99 100 100 100  CO2 29 31 29 28 28   GLUCOSE 111* 119* 108* 119* 161*  BUN 20 19 17 19 18   CREATININE 1.23 1.30* 1.18 1.37* 1.10  CALCIUM 11.7* 10.9* 10.1 10.8* 9.8  MG  --   --  1.6* 1.8 1.8    GFR: Estimated Creatinine Clearance: 107.7 mL/min (by C-G formula based on SCr of 1.1 mg/dL). Liver Function Tests: Recent Labs  Lab 02/20/21 1335 02/21/21 0619  AST 28 20  ALT 30 23  ALKPHOS 63 56  BILITOT 0.3 0.5  PROT 7.2 6.7  ALBUMIN 3.2* 3.0*    Recent Labs  Lab 02/20/21 1335  LIPASE 18    No results for input(s): AMMONIA in the last 168 hours. Coagulation Profile: Recent Labs  Lab 02/20/21 1731 02/21/21 0619  INR 1.2 1.2    Cardiac  Enzymes: No results for input(s): CKTOTAL, CKMB, CKMBINDEX, TROPONINI in the last 168 hours. BNP (last 3 results) No results for input(s): PROBNP in the last 8760 hours. HbA1C: Recent Labs    02/22/21 0746  HGBA1C 5.0    CBG: Recent Labs  Lab 02/23/21 0814 02/23/21 1143 02/23/21 1635 02/23/21 2107 02/24/21 0724  GLUCAP 95 98 101* 132* 183*    Lipid Profile: No results for input(s): CHOL, HDL, LDLCALC, TRIG, CHOLHDL, LDLDIRECT in the last 72 hours. Thyroid Function Tests: No results for input(s): TSH, T4TOTAL, FREET4, T3FREE, THYROIDAB in the last 72 hours. Anemia Panel: Recent Labs    02/22/21 1444  RETICCTPCT 1.5    Sepsis Labs: No results for input(s): PROCALCITON, LATICACIDVEN in the last 168 hours.  Recent Results (from the past 240 hour(s))  Resp Panel by RT-PCR (Flu A&B, Covid)  Nasopharyngeal Swab     Status: None   Collection Time: 02/20/21  4:56 PM   Specimen: Nasopharyngeal Swab; Nasopharyngeal(NP) swabs in vial transport medium  Result Value Ref Range Status   SARS Coronavirus 2 by RT PCR NEGATIVE NEGATIVE Final    Comment: (NOTE) SARS-CoV-2 target nucleic acids are NOT DETECTED.  The SARS-CoV-2 RNA is generally detectable in upper respiratory specimens during the acute phase of infection. The lowest concentration of SARS-CoV-2 viral copies this assay can detect is 138 copies/mL. A negative result does not preclude SARS-Cov-2 infection and should not be used as the sole basis for treatment or other patient management decisions. A negative result may occur with  improper specimen collection/handling, submission of specimen other than nasopharyngeal swab, presence of viral mutation(s) within the areas targeted by this assay, and inadequate number of viral copies(<138 copies/mL). A negative result must be combined with clinical observations, patient history, and epidemiological information. The expected result is Negative.  Fact Sheet for Patients:   EntrepreneurPulse.com.au  Fact Sheet for Healthcare Providers:  IncredibleEmployment.be  This test is no t yet approved or cleared by the [place] FDA and  has been authorized for detection and/or diagnosis of SARS-CoV-2 by FDA under an Emergency Use Authorization (EUA). This EUA will remain  in effect (meaning this test can be used) for the duration of the COVID-19 declaration under Section 564(b)(1) of the Act, 21 U.S.C.section 360bbb-3(b)(1), unless the authorization is terminated  or revoked sooner.       Influenza A by PCR NEGATIVE NEGATIVE Final   Influenza B by PCR NEGATIVE NEGATIVE Final    Comment: (NOTE) The Xpert Xpress SARS-CoV-2/FLU/RSV plus assay is intended as an aid in the diagnosis of influenza from Nasopharyngeal swab specimens and should not be used as a sole basis for treatment. Nasal washings and aspirates are unacceptable for Xpert Xpress SARS-CoV-2/FLU/RSV testing.  Fact Sheet for Patients: EntrepreneurPulse.com.au  Fact Sheet for Healthcare Providers: IncredibleEmployment.be  This test is not yet approved or cleared by the [place] FDA and has been authorized for detection and/or diagnosis of SARS-CoV-2 by FDA under an Emergency Use Authorization (EUA). This EUA will remain in effect (meaning this test can be used) for the duration of the COVID-19 declaration under Section 564(b)(1) of the Act, 21 U.S.C. section 360bbb-3(b)(1), unless the authorization is terminated or revoked.  Performed at [facility], [address], [place], [place] [address]           Radiology Studies: DG Abd 2 Views  Result Date: 02/23/2021 CLINICAL DATA:  Abdominal pain.  Constipation. EXAM: ABDOMEN - 2 VIEW COMPARISON:  None. FINDINGS: No abnormal bowel dilatation is noted. Moderate amount of stool seen throughout the colon. There is no evidence of free air. No radio-opaque  calculi or other significant radiographic abnormality is seen. IMPRESSION: Moderate stool burden.  No abnormal bowel dilatation is noted. Electronically Signed   By: Marijo Conception M.D.   On: 02/23/2021 10:01        Scheduled Meds:  insulin aspart  0-9 Units Subcutaneous TID WC   levothyroxine  300 mcg Oral Q0600   pantoprazole (PROTONIX) IV  40 mg Intravenous Q12H   predniSONE  50 mg Oral Daily   senna-docusate  1 tablet Oral BID   SUMAtriptan  6 mg Subcutaneous Once   Continuous Infusions:          Aline August, MD [facility] 02/24/2021, 8:04 AM

## 2021-02-24 NOTE — Progress Notes (Signed)
Hematology/Oncology Progress Note [facility] Telephone:([phone] [phone]  Patient Care Team: Sharyne Peach, MD as PCP - General (Family Medicine) Earlie Server, MD as Consulting Physician (Hematology and Oncology)   Name of the patient: Zachary Perkins  [id number]  1961-03-29  Date of visit: 02/24/21   INTERVAL HISTORY-  He feels better.  One dose of prednisone 78m yesterday.  Pain has improved.  + night sweats    Review of systems- Review of Systems  Constitutional:  Positive for fatigue and unexpected weight change. Negative for appetite change, chills and fever.  HENT:   Negative for hearing loss and voice change.   Eyes:  Negative for eye problems and icterus.  Respiratory:  Negative for chest tightness, cough and shortness of breath.   Cardiovascular:  Negative for chest pain and leg swelling.  Gastrointestinal:  Positive for constipation. Negative for abdominal distention and abdominal pain.  Endocrine: Negative for hot flashes.  Genitourinary:  Negative for difficulty urinating, dysuria and frequency.   Musculoskeletal:  Negative for arthralgias.  Skin:  Negative for itching and rash.  Neurological:  Negative for light-headedness and numbness.  Hematological:  Negative for adenopathy. Does not bruise/bleed easily.  Psychiatric/Behavioral:  Negative for confusion.    Allergies  Allergen Reactions   Lisinopril Cough   Metformin And Related     PASSED OUT    Patient Active Problem List   Diagnosis Date Noted   Iron deficiency anemia due to chronic blood loss    Gastric mass 02/20/2021   Weight loss 02/20/2021   Acute on chronic anemia 02/20/2021   Diabetes mellitus type II, controlled ([place] 02/20/2021   Nausea in adult 02/20/2021   Constipation 02/20/2021   Essential hypertension 02/20/2021   Hypothyroidism 02/20/2021     Past Medical History:  Diagnosis Date   Diabetes mellitus without complication ([place]     Hypertension    Hypothyroidism    Morbid obesity ([place]      Past Surgical History:  Procedure Laterality Date   CARPAL TUNNEL RELEASE Left    ESOPHAGOGASTRODUODENOSCOPY (EGD) WITH PROPOFOL N/A 02/21/2021   Procedure: ESOPHAGOGASTRODUODENOSCOPY (EGD) WITH PROPOFOL;  Surgeon: RAnnamaria Helling DO;  [place] [facility]  Service: Gastroenterology;  Laterality: N/A;  after 2pm   INSERTION OF MESH N/A 05/02/2017   Procedure: INSERTION OF MESH;  Surgeon: SLeonie Green MD;  Location: [facility];  Service: General;  Laterality: N/A;   UMBILICAL HERNIA REPAIR N/A 05/02/2017   Procedure: HERNIA REPAIR UMBILICAL ADULT;  Surgeon: SLeonie Green MD;  Location: [facility];  Service: General;  Laterality: N/A;    Social History   Socioeconomic History   Marital status: Married    Spouse name: Melonie   Number of children: 0   Years of education: 12   Highest education level: 12th grade  Occupational History   Not on file  Tobacco Use   Smoking status: Former    Packs/day: 1.00    Years: 20.00    Pack years: 20.00    Types: Cigarettes    Quit date: 04/25/2007    Years since quitting: 13.8   Smokeless tobacco: Never  Vaping Use   Vaping Use: Never used  Substance and Sexual Activity   Alcohol use: Yes    Comment: rare   Drug use: No   Sexual activity: Yes  Other Topics Concern   Not on file  Social History Narrative   Not on file   Social Determinants of Health  Financial Resource Strain: Not on file  Food Insecurity: Not on file  Transportation Needs: Not on file  Physical Activity: Not on file  Stress: Not on file  Social Connections: Not on file  Intimate Partner Violence: Not on file     History reviewed. No pertinent family history.   Current Facility-Administered Medications:    acetaminophen (TYLENOL) tablet 650 mg, 650 mg, Oral, Q6H PRN, 650 mg at 02/20/21 2044 **OR** acetaminophen (TYLENOL) suppository 650 mg, 650 mg, Rectal, Q6H PRN,  [place], Einar Pheasant, MD   aspirin-acetaminophen-caffeine (EXCEDRIN MIGRAINE) per tablet 1 tablet, 1 tablet, Oral, Q6H PRN, Starla Link, Kshitiz, MD   bisacodyl (DULCOLAX) suppository 10 mg, 10 mg, Rectal, Daily PRN, Starla Link, Kshitiz, MD, 10 mg at 02/24/21 1023   HYDROmorphone (DILAUDID) injection 1-2 mg, 1-2 mg, Intravenous, Q2H PRN, Starla Link, Kshitiz, MD, 1 mg at 02/24/21 0429   insulin aspart (novoLOG) injection 0-9 Units, 0-9 Units, Subcutaneous, TID WC, [place], Einar Pheasant, MD, 2 Units at 02/24/21 1233   levothyroxine (SYNTHROID) tablet 300 mcg, 300 mcg, Oral, Q0600, Starla Link, Kshitiz, MD, 300 mcg at 02/24/21 0608   ondansetron (ZOFRAN) injection 4 mg, 4 mg, Intravenous, Q6H PRN, Sharion Settler, NP, 4 mg at 02/24/21 1022   oxyCODONE (Oxy IR/ROXICODONE) immediate release tablet 5 mg, 5 mg, Oral, Q4H PRN, [place], Einar Pheasant, MD, 5 mg at 02/23/21 1339   pantoprazole (PROTONIX) EC tablet 40 mg, 40 mg, Oral, BID AC, Alekh, Kshitiz, MD, 40 mg at 02/24/21 1023   polyethylene glycol (MIRALAX / GLYCOLAX) packet 17 g, 17 g, Oral, Daily PRN, Starla Link, Kshitiz, MD, 17 g at 02/24/21 1023   senna-docusate (Senokot-S) tablet 1 tablet, 1 tablet, Oral, BID, Alekh, Kshitiz, MD, 1 tablet at 02/24/21 1021   SUMAtriptan (IMITREX) injection 6 mg, 6 mg, Subcutaneous, Once, Jules Husbands, MD   Physical exam:  Vitals:   02/24/21 0438 02/24/21 0459 02/24/21 0726 02/24/21 1518  BP: 135/81  132/86 (!) 143/79  Pulse: 62  62 66  Resp: 20  20 20   Temp: 97.9 F (36.6 C)  97.6 F (36.4 C) 98 F (36.7 C)  TempSrc: Oral  Oral Oral  SpO2: 98%  97% 98%  Weight: (P) 285 lb 7.9 oz (129.5 kg) 285 lb 7.9 oz (129.5 kg)    Height:       Physical Exam Constitutional:      General: He is not in acute distress.    Appearance: He is not diaphoretic.  HENT:     Head: Normocephalic and atraumatic.     Nose: Nose normal.     Mouth/Throat:     Pharynx: No oropharyngeal exudate.  Eyes:     General: No scleral icterus.    Pupils: Pupils are  equal, round, and reactive to light.  Cardiovascular:     Rate and Rhythm: Normal rate and regular rhythm.     Heart sounds: No murmur heard. Pulmonary:     Effort: Pulmonary effort is normal. No respiratory distress.     Breath sounds: No rales.  Chest:     Chest wall: No tenderness.  Abdominal:     General: There is no distension.     Palpations: Abdomen is soft.     Tenderness: There is no abdominal tenderness.  Musculoskeletal:        General: Normal range of motion.     Cervical back: Normal range of motion and neck supple.  Skin:    General: Skin is warm and dry.     Coloration:  Skin is pale.     Findings: No erythema.  Neurological:     Mental Status: He is alert and oriented to person, place, and time.     Cranial Nerves: No cranial nerve deficit.     Motor: No abnormal muscle tone.     Coordination: Coordination normal.  Psychiatric:        Mood and Affect: Affect normal.    Labs. CBC    Component Value Date/Time   WBC 7.3 02/24/2021 0436   RBC 3.61 (L) 02/24/2021 0436   HGB 8.5 (L) 02/24/2021 0436   HCT 27.6 (L) 02/24/2021 0436   PLT 297 02/24/2021 0436   MCV 76.5 (L) 02/24/2021 0436   MCH 23.5 (L) 02/24/2021 0436   MCHC 30.8 02/24/2021 0436   RDW 17.7 (H) 02/24/2021 0436   LYMPHSABS 0.5 (L) 02/24/2021 0436   MONOABS 0.1 02/24/2021 0436   EOSABS 0.0 02/24/2021 0436   BASOSABS 0.0 02/24/2021 0436   CMP Latest Ref Rng & Units 02/24/2021 02/23/2021 02/22/2021  Glucose 70 - 99 mg/dL 161(H) 119(H) 108(H)  BUN 6 - 20 mg/dL 18 19 17   Creatinine 0.61 - 1.24 mg/dL 1.10 1.37(H) 1.18  Sodium 135 - 145 mmol/L 136 136 137  Potassium 3.5 - 5.1 mmol/L 4.1 3.7 3.8  Chloride 98 - 111 mmol/L 100 100 100  CO2 22 - 32 mmol/L 28 28 29   Calcium 8.9 - 10.3 mg/dL 9.8 10.8(H) 10.1  Total Protein 6.5 - 8.1 g/dL - - -  Total Bilirubin 0.3 - 1.2 mg/dL - - -  Alkaline Phos 38 - 126 U/L - - -  AST 15 - 41 U/L - - -  ALT 0 - 44 U/L - - -      RADIOGRAPHIC STUDIES: I have  personally reviewed the radiological images as listed and agreed with the findings in the report. CT ABDOMEN PELVIS W CONTRAST  Result Date: 02/20/2021 CLINICAL DATA:  Bowel obstruction suspected EXAM: CT ABDOMEN AND PELVIS WITH CONTRAST TECHNIQUE: Multidetector CT imaging of the abdomen and pelvis was performed using the standard protocol following bolus administration of intravenous contrast. CONTRAST:  156m OMNIPAQUE IOHEXOL 350 MG/ML SOLN COMPARISON:  None. FINDINGS: Lower chest: Trace left pleural effusion. Hepatobiliary: No focal liver abnormality is seen. No gallstones, gallbladder wall thickening, or biliary dilatation. Pancreas: Pancreatic atrophy. Tail of the pancreas is located adjacent to a large left upper quadrant mass which is described below with no intervening fat plane. Spleen/Stomach: Large heterogeneous mass of the left upper quadrant centered at the body of the stomach and spleen and involving the splenic artery and vein and tail of the pancreas. Mass measures approximately 12.0 x 11.4 cm in axial dimension. Air seen within the mass, likely due to fistulization with the stomach and/or necrosis. Small and large bowel: Normal in caliber with no evidence of wall thickening or obstruction. Normal appendix. Numerous colonic diverticula, most pronounced in the sigmoid colon. Adrenals/Urinary Tract: Adrenal glands are unremarkable. Kidneys are normal, without renal calculi, focal lesion, or hydronephrosis. Bladder is unremarkable. Vascular/Lymphatic: Enlarged periportal lymph nodes. Reference periportal lymph node measuring 1.4 cm in short axis located on series 2, image 23. Aortic atherosclerosis Reproductive: Prostate is unremarkable. Other: No abdominal wall hernia or abnormality. Trace free fluid in the pelvis. Musculoskeletal: No acute or significant osseous findings. IMPRESSION: Large heterogeneous mass of the left upper quadrant with central cavitation centered at the body of the stomach and  spleen and involving the splenic artery, splenic vein and tail of  the pancreas. Favor gastric or splenic primary neoplasm. Enlarged periportal lymph nodes, concerning for nodal metastatic disease. Trace left pleural effusion and trace free fluid in the pelvis. Aortic Atherosclerosis (ICD10-I70.0). Electronically Signed   By: Yetta Glassman M.D.   On: 02/20/2021 16:21   DG Abd 2 Views  Result Date: 02/23/2021 CLINICAL DATA:  Abdominal pain.  Constipation. EXAM: ABDOMEN - 2 VIEW COMPARISON:  None. FINDINGS: No abnormal bowel dilatation is noted. Moderate amount of stool seen throughout the colon. There is no evidence of free air. No radio-opaque calculi or other significant radiographic abnormality is seen. IMPRESSION: Moderate stool burden.  No abnormal bowel dilatation is noted. Electronically Signed   By: Marijo Conception M.D.   On: 02/23/2021 10:01    Assessment and plan-   #Large gastric mass  periportal lymph node enlargement. -02/21/2021 EGD showed large fungating ulcerated noncircumferential mass with no bleeding and no stigmata of recent bleeding was found on the greater curvature of the stomach.  Biopsy was taken.  Large amount of necrotic and ulcerated appearing tissue.  Acute duodenitis.  Gastritis Awaiting for pathology report. 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  He understands that the result is not finalized yet and subjected to change. He has constitutional symptoms including unintentional weight loss, fatigue, night sweats.  Will obtain urgent bone marrow biopsy for further evaluation. Prednisone 30m x 1 today.   Abdominal pain, controlled. Continue current regimen.  #Constipation, continue current bowel regimen. #Severe iron deficiency anemia. due to underlying malignancy. IV Venofer daily x3.  Status post PRBCs transfusion. Hemoglobin has improved appropriately.   Continue monitoring.  He can be discharged after bone marrow biopsy early next week. He has outpatient follow up appt set up with me.  Thank you for allowing me to participate in the care of this patient.   ZEarlie Server MD, PhD Hematology Oncology [facility] 02/24/2021

## 2021-02-25 LAB — CBC WITH DIFFERENTIAL/PLATELET
Abs Immature Granulocytes: 0.06 10*3/uL (ref 0.00–0.07)
Basophils Absolute: 0 10*3/uL (ref 0.0–0.1)
Basophils Relative: 0 %
Eosinophils Absolute: 0 10*3/uL (ref 0.0–0.5)
Eosinophils Relative: 0 %
HCT: 25.6 % — ABNORMAL LOW (ref 39.0–52.0)
Hemoglobin: 8 g/dL — ABNORMAL LOW (ref 13.0–17.0)
Immature Granulocytes: 1 %
Lymphocytes Relative: 8 %
Lymphs Abs: 0.9 10*3/uL (ref 0.7–4.0)
MCH: 24.1 pg — ABNORMAL LOW (ref 26.0–34.0)
MCHC: 31.3 g/dL (ref 30.0–36.0)
MCV: 77.1 fL — ABNORMAL LOW (ref 80.0–100.0)
Monocytes Absolute: 0.7 10*3/uL (ref 0.1–1.0)
Monocytes Relative: 6 %
Neutro Abs: 9.3 10*3/uL — ABNORMAL HIGH (ref 1.7–7.7)
Neutrophils Relative %: 85 %
Platelets: 284 10*3/uL (ref 150–400)
RBC: 3.32 MIL/uL — ABNORMAL LOW (ref 4.22–5.81)
RDW: 18 % — ABNORMAL HIGH (ref 11.5–15.5)
WBC: 10.9 10*3/uL — ABNORMAL HIGH (ref 4.0–10.5)
nRBC: 0 % (ref 0.0–0.2)

## 2021-02-25 LAB — BASIC METABOLIC PANEL
Anion gap: 8 (ref 5–15)
BUN: 17 mg/dL (ref 6–20)
CO2: 29 mmol/L (ref 22–32)
Calcium: 9.9 mg/dL (ref 8.9–10.3)
Chloride: 101 mmol/L (ref 98–111)
Creatinine, Ser: 1.03 mg/dL (ref 0.61–1.24)
GFR, Estimated: >60 mL/min (ref >60)
Glucose, Bld: 132 mg/dL — ABNORMAL HIGH (ref 70–99)
Potassium: 4.2 mmol/L (ref 3.5–5.1)
Sodium: 138 mmol/L (ref 135–145)

## 2021-02-25 LAB — GLUCOSE, CAPILLARY
Glucose-Capillary: 158 mg/dL — ABNORMAL HIGH (ref 70–99)
Glucose-Capillary: 139 mg/dL — ABNORMAL HIGH (ref 70–99)
Glucose-Capillary: 109 mg/dL — ABNORMAL HIGH (ref 70–99)

## 2021-02-25 LAB — MAGNESIUM: Magnesium: 1.7 mg/dL (ref 1.7–2.4)

## 2021-02-25 NOTE — Plan of Care (Signed)
Continuing with plan of care. 

## 2021-02-25 NOTE — Progress Notes (Signed)
Patient ID: Zachary Perkins, male   DOB: 09/10/1960, 60 y.o.   MRN: [id number]  PROGRESS NOTE    Zachary Perkins  OAC:[id number] DOB: March 12, 1961 DOA: 02/20/2021 PCP: Sharyne Peach, MD   Brief Narrative:  60 y.o. male with medical history significant of diabetes mellitus type II, hypertension, obesity, hypothyroidism presented with abdominal pain, nausea and constipation.  On presentation, hemoglobin was 7 (10.2 2 months ago in [place]).  CT of the abdomen showed  large heterogeneous mass in left upper quadrant with central cavitation centered in the body of the stomach and spleen involving the splenic artery splenic vein and tail of the pancreas favor gastric or splenic primary neoplasm.  Enlarged periportal lymph nodes concerning for nodal metastatic disease.  Trace left pleural effusion and trace free fluid in the pelvis.  GI and general surgery were consulted.  He was started on IV Protonix.  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  Oncology was consulted.  Assessment & Plan:   Gastric/splenic mass Possible GI bleed -CT of the abdomen showed  large heterogeneous mass in left upper quadrant with central cavitation centered in the body of the stomach and spleen involving the splenic artery splenic vein and tail of the pancreas favor gastric or splenic primary neoplasm.  Enlarged periportal lymph nodes concerning for nodal metastatic disease.  Trace left pleural effusion and trace free fluid in the pelvis. 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   -Tolerating advanced diet.  Off IV fluids. -Abdominal pain is improving after having bowel movement on 02/23/2021. 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  He has been started on  oral prednisone by oncology. -GI reevaluated the patient on 02/23/2021.  Continue Protonix oral twice a day as per GI recommendations.  GI signed off again on 02/23/2021.  Constipation -Abdominal x-ray on 02/23/2021 showed moderate constipation.  Continue laxatives.  Received enema on 02/23/2021 and had a bowel movement.  Acute on chronic anemia/known iron deficiency anemia -Possibly from above.  Hemoglobin 7 on presentation, 10.2 2 months ago in [facility] -Status post 3 units packed red cells transfusion since admission.  Hemoglobin 8 this morning. -Received IV Venofer 3 doses as per oncology as well. -Monitor H&H.  Hypomagnesemia -Improved.  Diabetes mellitus type 2 -CBGs with SSI.  A1c 5.  Hypertension -Blood pressure stable.  Antihypertensives on hold  Hypercalcemia -Possibly from cancer and dehydration.  Improved.  Hypothyroidism -Continue Synthroid  DVT prophylaxis: SCDs Code Status: Full Family Communication: Wife at bedside Disposition Plan: Status is: Inpatient  Remains inpatient appropriate because:Inpatient level of care appropriate due to severity of illness  Dispo: The patient is from: Home              Anticipated d/c is to: Home               Patient currently is not medically stable to d/c.   Difficult to place patient No  Consultants: GI/general surgery  Procedures: EGD on 02/21/2021 Antimicrobials: None   Subjective: Patient seen and examined at bedside.  No fever, vomiting or worsening shortness of breath reported.  Still complains of intermittent abdominal pain but improving.    Objective: Vitals:   02/24/21 1518 02/24/21 1950 02/25/21 0443 02/25/21 0550  BP: (!) 143/79 Marland Kitchen)  153/83 (!) 141/86   Pulse: 66 67 (!) 57   Resp: _0 Temp: 98 F (36.7 C) 98.3 F (36.8 C) 97.7 F (36.5 C)   TempSrc: Oral  Oral   SpO2: 98% 97% 100%   Weight:    130.2 kg  Height:        Intake/Output Summary (Last 24 hours) at 02/25/2021 0809 Last data filed  at 02/25/2021 0538 Gross per 24 hour  Intake 480 ml  Output 0 ml  Net 480 ml    Filed Weights   02/24/21 0438 02/24/21 0459 02/25/21 0550  Weight: (P) 129.5 kg 129.5 kg 130.2 kg    Examination:  General exam: Currently on room air.  No acute distress.  Looks chronically ill and deconditioned.   Respiratory system: Bilateral decreased breath sounds at bases cardiovascular system: S1-S2 heard; bradycardic intermittently gastrointestinal system: Abdomen is distended slightly, soft and mildly tender in the left upper quadrant.  Bowel sounds are heard extremities: No edema or cyanosis  Data Reviewed: I have personally reviewed following labs and imaging studies  CBC: Recent Labs  Lab 02/21/21 0619 02/22/21 0746 02/22/21 1444 02/23/21 0045 02/24/21 0436 02/25/21 0414  WBC 6.6 6.2  --  7.3 7.3 10.9*  NEUTROABS 4.9 4.3  --  5.4 6.6 9.3*  HGB 7.2* 6.7* 7.0* 8.1* 8.5* 8.0*  HCT 23.2* 22.2* 22.7* 26.3* 27.6* 25.6*  MCV 75.3* 74.7*  --  77.1* 76.5* 77.1*  PLT 296 264  --  273 297 141    Basic Metabolic Panel: Recent Labs  Lab 02/21/21 0619 02/22/21 0746 02/23/21 0045 02/24/21 0436 02/25/21 0414  NA 136 137 136 136 138  K 4.0 3.8 3.7 4.1 4.2  CL 99 100 100 100 101  CO2 _1 GLUCOSE 119* 108* 119* 161* 132*  BUN _2 CREATININE 1.30* 1.18 1.37* 1.10 1.03  CALCIUM 10.9* 10.1 10.8* 9.8 9.9  MG  --  1.6* 1.8 1.8 1.7    GFR: Estimated Creatinine Clearance: 115.2 mL/min (by C-G formula based on SCr of 1.03 mg/dL). Liver Function Tests: Recent Labs  Lab 02/20/21 1335 02/21/21 0619  AST 28 20  ALT 30 23  ALKPHOS 63 56  BILITOT 0.3 0.5  PROT 7.2 6.7  ALBUMIN 3.2* 3.0*    Recent Labs  Lab 02/20/21 1335  LIPASE 18    No results for input(s): AMMONIA in the last 168 hours. Coagulation Profile: Recent Labs  Lab 02/20/21 1731 02/21/21 0619  INR 1.2 1.2    Cardiac Enzymes: No results for input(s): CKTOTAL, CKMB, CKMBINDEX, TROPONINI in  the last 168 hours. BNP (last 3 results) No results for input(s): PROBNP in the last 8760 hours. HbA1C: No results for input(s): HGBA1C in the last 72 hours.  CBG: Recent Labs  Lab 02/23/21 2107 02/24/21 0724 02/24/21 1135 02/24/21 1756 02/24/21 2143  GLUCAP 132* 183* 200* 163* 173*    Lipid Profile: No results for input(s): CHOL, HDL, LDLCALC, TRIG, CHOLHDL, LDLDIRECT in the last 72 hours. Thyroid Function Tests: No results for input(s): TSH, T4TOTAL, FREET4, T3FREE, THYROIDAB in the last 72 hours. Anemia Panel: Recent Labs    02/22/21 1444  RETICCTPCT 1.5    Sepsis Labs: No results for input(s): PROCALCITON, LATICACIDVEN in the last 168 hours.  Recent Results (from the past 240 hour(s))  Resp Panel by RT-PCR (Flu A&B, Covid) Nasopharyngeal Swab     Status: None   Collection Time: 02/20/21  4:56 PM   Specimen: Nasopharyngeal Swab; Nasopharyngeal(NP) swabs in vial transport medium  Result Value Ref Range Status   SARS Coronavirus 2 by RT PCR NEGATIVE NEGATIVE Final    Comment: (NOTE) SARS-CoV-2 target nucleic acids are NOT DETECTED.  The SARS-CoV-2 RNA is generally detectable in upper respiratory specimens during the acute phase of infection. The lowest concentration of SARS-CoV-2 viral copies this assay can detect is 138 copies/mL. A negative result does not preclude SARS-Cov-2 infection and should not be used as the sole basis for treatment or other patient management decisions. A negative result may occur with  improper specimen collection/handling, submission of specimen other than nasopharyngeal swab, presence of viral mutation(s) within the areas targeted by this assay, and inadequate number of viral copies(<138 copies/mL). A negative result must be combined with clinical observations, patient history, and epidemiological information. The expected result is Negative.  Fact Sheet for Patients:  EntrepreneurPulse.com.au  Fact Sheet for  Healthcare Providers:  IncredibleEmployment.be  This test is no t yet approved or cleared by the [place] FDA and  has been authorized for detection and/or diagnosis of SARS-CoV-2 by FDA under an Emergency Use Authorization (EUA). This EUA will remain  in effect (meaning this test can be used) for the duration of the COVID-19 declaration under Section 564(b)(1) of the Act, 21 U.S.C.section 360bbb-3(b)(1), unless the authorization is terminated  or revoked sooner.       Influenza A by PCR NEGATIVE NEGATIVE Final   Influenza B by PCR NEGATIVE NEGATIVE Final    Comment: (NOTE) The Xpert Xpress SARS-CoV-2/FLU/RSV plus assay is intended as an aid in the diagnosis of influenza from Nasopharyngeal swab specimens and should not be used as a sole basis for treatment. Nasal washings and aspirates are unacceptable for Xpert Xpress SARS-CoV-2/FLU/RSV testing.  Fact Sheet for Patients: EntrepreneurPulse.com.au  Fact Sheet for Healthcare Providers: IncredibleEmployment.be  This test is not yet approved or cleared by the [place] FDA and has been authorized for detection and/or diagnosis of SARS-CoV-2 by FDA under an Emergency Use Authorization (EUA). This EUA will remain in effect (meaning this test can be used) for the duration of the COVID-19 declaration under Section 564(b)(1) of the Act, 21 U.S.C. section 360bbb-3(b)(1), unless the authorization is terminated or revoked.  Performed at [facility], [address] [place], [place] [address]           Radiology Studies: DG Abd 2 Views  Result Date: 02/23/2021 CLINICAL DATA:  Abdominal pain.  Constipation. EXAM: ABDOMEN - 2 VIEW COMPARISON:  None. FINDINGS: No abnormal bowel dilatation is noted. Moderate amount of stool seen throughout the colon. There is no evidence of free air. No radio-opaque calculi or other significant radiographic abnormality is seen.  IMPRESSION: Moderate stool burden.  No abnormal bowel dilatation is noted. Electronically Signed   By: Marijo Conception M.D.   On: 02/23/2021 10:01        Scheduled Meds:  insulin aspart  0-9 Units Subcutaneous TID WC   levothyroxine  300 mcg Oral Q0600   pantoprazole  40 mg Oral BID AC   senna-docusate  1 tablet Oral BID   SUMAtriptan  6 mg Subcutaneous Once   Continuous Infusions:          Aline August, MD [facility] 02/25/2021, 8:09 AM

## 2021-02-26 ENCOUNTER — Encounter: Payer: Self-pay | Admitting: Internal Medicine

## 2021-02-26 ENCOUNTER — Inpatient Hospital Stay: Payer: BC Managed Care – PPO

## 2021-02-26 ENCOUNTER — Other Ambulatory Visit: Payer: Self-pay | Admitting: Oncology

## 2021-02-26 LAB — COMP PANEL: LEUKEMIA/LYMPHOMA

## 2021-02-26 LAB — CBC WITH DIFFERENTIAL/PLATELET
Abs Immature Granulocytes: 0.04 10*3/uL (ref 0.00–0.07)
Basophils Absolute: 0 10*3/uL (ref 0.0–0.1)
Basophils Relative: 0 %
Eosinophils Absolute: 0 10*3/uL (ref 0.0–0.5)
Eosinophils Relative: 0 %
HCT: 28.3 % — ABNORMAL LOW (ref 39.0–52.0)
Hemoglobin: 8.6 g/dL — ABNORMAL LOW (ref 13.0–17.0)
Immature Granulocytes: 1 %
Lymphocytes Relative: 19 %
Lymphs Abs: 1.4 10*3/uL (ref 0.7–4.0)
MCH: 23.6 pg — ABNORMAL LOW (ref 26.0–34.0)
MCHC: 30.4 g/dL (ref 30.0–36.0)
MCV: 77.5 fL — ABNORMAL LOW (ref 80.0–100.0)
Monocytes Absolute: 0.8 10*3/uL (ref 0.1–1.0)
Monocytes Relative: 11 %
Neutro Abs: 5 10*3/uL (ref 1.7–7.7)
Neutrophils Relative %: 69 %
Platelets: 300 10*3/uL (ref 150–400)
RBC: 3.65 MIL/uL — ABNORMAL LOW (ref 4.22–5.81)
RDW: 18.8 % — ABNORMAL HIGH (ref 11.5–15.5)
WBC: 7.3 10*3/uL (ref 4.0–10.5)
nRBC: 0 % (ref 0.0–0.2)

## 2021-02-26 LAB — GLUCOSE, CAPILLARY
Glucose-Capillary: 138 mg/dL — ABNORMAL HIGH (ref 70–99)
Glucose-Capillary: 88 mg/dL (ref 70–99)
Glucose-Capillary: 87 mg/dL (ref 70–99)

## 2021-02-26 MED ORDER — MIDAZOLAM HCL 2 MG/2ML IJ SOLN
INTRAMUSCULAR | Status: DC | PRN
  Administered 2021-02-26: 1 mg via INTRAVENOUS

## 2021-02-26 MED ORDER — MIDAZOLAM HCL 5 MG/5ML IJ SOLN
INTRAMUSCULAR | Status: DC | PRN
  Administered 2021-02-26: .5 mg via INTRAVENOUS

## 2021-02-26 MED ORDER — PANTOPRAZOLE SODIUM 40 MG PO TBEC: 40.0000 mg | DELAYED_RELEASE_TABLET | Freq: Two times a day (BID) | ORAL | 0 refills | Status: DC

## 2021-02-26 MED ORDER — FENTANYL CITRATE (PF) 100 MCG/2ML IJ SOLN
INTRAMUSCULAR | Status: DC | PRN
  Administered 2021-02-26 (×2): 50 ug via INTRAVENOUS

## 2021-02-26 MED ORDER — HEPARIN SOD (PORK) LOCK FLUSH 100 UNIT/ML IV SOLN
INTRAVENOUS | Status: AC
  Filled 2021-02-26: qty 5

## 2021-02-26 MED ORDER — ONDANSETRON HCL 4 MG PO TABS: 4.0000 mg | ORAL_TABLET | Freq: Three times a day (TID) | ORAL | 0 refills | Status: DC | PRN

## 2021-02-26 MED ORDER — BISACODYL 10 MG RE SUPP: 10.0000 mg | Freq: Every day | RECTAL | 0 refills | Status: AC | PRN

## 2021-02-26 MED ORDER — OXYCODONE HCL 5 MG PO TABS: 5.0000 mg | ORAL_TABLET | ORAL | 0 refills | Status: DC | PRN

## 2021-02-26 MED ORDER — SENNOSIDES-DOCUSATE SODIUM 8.6-50 MG PO TABS: 1.0000 | ORAL_TABLET | Freq: Two times a day (BID) | ORAL | 0 refills | Status: DC

## 2021-02-26 MED ORDER — FENTANYL CITRATE (PF) 100 MCG/2ML IJ SOLN
INTRAMUSCULAR | Status: AC
  Filled 2021-02-26: qty 2

## 2021-02-26 MED ORDER — MIDAZOLAM HCL 2 MG/2ML IJ SOLN
INTRAMUSCULAR | Status: AC
  Filled 2021-02-26: qty 2

## 2021-02-26 MED ORDER — POLYETHYLENE GLYCOL 3350 17 G PO PACK: 17.0000 g | PACK | Freq: Every day | ORAL | 0 refills | Status: DC | PRN

## 2021-02-26 NOTE — Discharge Summary (Signed)
Physician Discharge Summary  Zachary Perkins KDX:[id number] DOB: Feb 03, 1961 DOA: 02/20/2021  PCP: Sharyne Peach, MD  Admit date: 02/20/2021 Discharge date: 02/26/2021  Admitted From: Home Disposition: Home  Recommendations for Outpatient Follow-up:  Follow up with PCP in 1 week with repeat CBC/BMP Outpatient follow-up with oncology at earliest convenience outpatient follow-up with GI Follow up in ED if symptoms worsen or new appear   Home Health: No Equipment/Devices: None  Discharge Condition: Guarded CODE STATUS: Full Diet recommendation: Heart healthy/carb modified  Brief/Interim Summary: 60 y.o. male with medical history significant of diabetes mellitus type II, hypertension, obesity, hypothyroidism presented with abdominal pain, nausea and constipation.  On presentation, hemoglobin was 7 (10.2 2 months ago in [place]).  CT of the abdomen showed  large heterogeneous mass in left upper quadrant with central cavitation centered in the body of the stomach and spleen involving the splenic artery splenic vein and tail of the pancreas favor gastric or splenic primary neoplasm.  Enlarged periportal lymph nodes concerning for nodal metastatic disease.  Trace left pleural effusion and trace free fluid in the pelvis.  GI and general surgery were consulted.  He was started on IV Protonix.  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  Oncology was consulted.  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  He will have bone marrow biopsy today by IR as per oncology recommendations.  He will be discharged home afterwards today with oncology follow-up at earliest convenience.  Discharge Diagnoses:   Gastric/splenic mass Possible GI bleed -CT of the abdomen showed  large heterogeneous mass in left upper quadrant with central cavitation centered in the body of the stomach and spleen involving the splenic  artery splenic vein and tail of the pancreas favor gastric or splenic primary neoplasm.  Enlarged periportal lymph nodes concerning for nodal metastatic disease.  Trace left pleural effusion and trace free fluid in the pelvis. 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   -Tolerating advanced diet.  Off IV fluids. -Abdominal pain is improving after having bowel movement on 02/23/2021. 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  He will be discharged home afterwards today with oncology follow-up at earliest convenience.  He received few doses of oral prednisone as per oncology recommendations. -GI reevaluated the patient on 02/23/2021.  Continue Protonix oral twice a day as per GI recommendations.  GI signed off again on 02/23/2021.  Outpatient follow-up with GI.   Constipation -Abdominal x-ray on 02/23/2021 showed moderate constipation.  Continue bowel regimen on discharge.  Received enema on 02/23/2021 and had a bowel movement.   Acute on chronic anemia/known iron deficiency anemia -Possibly from above.  Hemoglobin 7 on presentation, 10.2 2 months ago in [facility] -Status post 3 units packed red cells transfusion since admission.  Hemoglobin 8.6 this morning. -Received IV Venofer 3 doses as per oncology as well. -Follow CBC as an outpatient   Hypomagnesemia -Improved.   Diabetes mellitus type 2 -Outpatient follow-up.  A1c 5.   Hypertension -Blood pressure stable.  Continue home regimen.  Hypercalcemia -Possibly from cancer and dehydration.  Improved.  Hypothyroidism -Continue Synthroid  Discharge Instructions  Discharge Instructions     Diet - low sodium heart healthy   Complete by: As directed  Increase activity slowly   Complete by: As directed       Allergies as  of 02/26/2021       Reactions   Lisinopril Cough   Metformin And Related    PASSED OUT        Medication List     STOP taking these medications    aspirin EC 81 MG tablet   ergocalciferol 1.25 MG (50000 UT) capsule Commonly known as: VITAMIN D2   ibuprofen 200 MG tablet Commonly known as: ADVIL       TAKE these medications    bisacodyl 10 MG suppository Commonly known as: DULCOLAX Place 1 suppository (10 mg total) rectally daily as needed for severe constipation.   hydrochlorothiazide 25 MG tablet Commonly known as: HYDRODIURIL Take 25 mg by mouth daily.   levothyroxine 300 MCG tablet Commonly known as: SYNTHROID Take 300 mcg by mouth daily.   Multi-Vitamin tablet Take 1 tablet by mouth daily.   ondansetron 4 MG tablet Commonly known as: Zofran Take 1 tablet (4 mg total) by mouth every 8 (eight) hours as needed for nausea or vomiting.   oxyCODONE 5 MG immediate release tablet Commonly known as: Oxy IR/ROXICODONE Take 1 tablet (5 mg total) by mouth every 4 (four) hours as needed for moderate pain.   pantoprazole 40 MG tablet Commonly known as: PROTONIX Take 1 tablet (40 mg total) by mouth 2 (two) times daily before a meal.   polyethylene glycol 17 g packet Commonly known as: MIRALAX / GLYCOLAX Take 17 g by mouth daily as needed for moderate constipation.   potassium chloride 10 MEQ tablet Commonly known as: KLOR-CON Take 10 mEq by mouth daily.   senna-docusate 8.6-50 MG tablet Commonly known as: Senokot-S Take 1 tablet by mouth 2 (two) times daily.        Follow-up Information     Pabon, [place] F, MD. Schedule an appointment as soon as possible for a visit in 1 week(s).   Specialty: General Surgery Why: LUQ mass, discuss port placement Contact information: [address] [place] [address] [phone]         Earlie Server, MD. Schedule an appointment as soon as possible for a visit in 1 week(s).   Specialty:  Oncology Contact information: [place] [address] [phone]         Annamaria Helling, DO. Schedule an appointment as soon as possible for a visit in 2 week(s).   Contact information: [facility] [place] [address] [phone]                Allergies  Allergen Reactions   Lisinopril Cough   Metformin And Related     PASSED OUT    Consultations: GI/oncology/general surgery   Procedures/Studies: CT ABDOMEN PELVIS W CONTRAST  Result Date: 02/20/2021 CLINICAL DATA:  Bowel obstruction suspected EXAM: CT ABDOMEN AND PELVIS WITH CONTRAST TECHNIQUE: Multidetector CT imaging of the abdomen and pelvis was performed using the standard protocol following bolus administration of intravenous contrast. CONTRAST:  136m OMNIPAQUE IOHEXOL 350 MG/ML SOLN COMPARISON:  None. FINDINGS: Lower chest: Trace left pleural effusion. Hepatobiliary: No focal liver abnormality is seen. No gallstones, gallbladder wall thickening, or biliary dilatation. Pancreas: Pancreatic atrophy. Tail of the pancreas is located adjacent to a large left upper quadrant mass which is described below with no intervening fat plane. Spleen/Stomach: Large heterogeneous mass of the left upper quadrant centered at the body of the stomach and spleen and  involving the splenic artery and vein and tail of the pancreas. Mass measures approximately 12.0 x 11.4 cm in axial dimension. Air seen within the mass, likely due to fistulization with the stomach and/or necrosis. Small and large bowel: Normal in caliber with no evidence of wall thickening or obstruction. Normal appendix. Numerous colonic diverticula, most pronounced in the sigmoid colon. Adrenals/Urinary Tract: Adrenal glands are unremarkable. Kidneys are normal, without renal calculi, focal lesion, or hydronephrosis. Bladder is unremarkable. Vascular/Lymphatic: Enlarged periportal lymph nodes. Reference periportal lymph node  measuring 1.4 cm in short axis located on series 2, image 23. Aortic atherosclerosis Reproductive: Prostate is unremarkable. Other: No abdominal wall hernia or abnormality. Trace free fluid in the pelvis. Musculoskeletal: No acute or significant osseous findings. IMPRESSION: Large heterogeneous mass of the left upper quadrant with central cavitation centered at the body of the stomach and spleen and involving the splenic artery, splenic vein and tail of the pancreas. Favor gastric or splenic primary neoplasm. Enlarged periportal lymph nodes, concerning for nodal metastatic disease. Trace left pleural effusion and trace free fluid in the pelvis. Aortic Atherosclerosis (ICD10-I70.0). Electronically Signed   By: Yetta Glassman M.D.   On: 02/20/2021 16:21   DG Abd 2 Views  Result Date: 02/23/2021 CLINICAL DATA:  Abdominal pain.  Constipation. EXAM: ABDOMEN - 2 VIEW COMPARISON:  None. FINDINGS: No abnormal bowel dilatation is noted. Moderate amount of stool seen throughout the colon. There is no evidence of free air. No radio-opaque calculi or other significant radiographic abnormality is seen. IMPRESSION: Moderate stool burden.  No abnormal bowel dilatation is noted. Electronically Signed   By: Marijo Conception M.D.   On: 02/23/2021 10:01      Subjective: Patient seen and examined at bedside.  States that his abdominal pain is controlled with pain medications.  He had bowel movement yesterday and today.  Denies overnight fever, vomiting.  Feels okay to go home today after bone marrow biopsy.  Discharge Exam: Vitals:   02/26/21 0447 02/26/21 0813  BP: (!) 142/89 (!) 143/88  Pulse: (!) 56   Resp: 16 12  Temp: 98 F (36.7 C) 98 F (36.7 C)  SpO2: 99% 100%    General: Pt is alert, awake, not in acute distress.  Currently on room air. Cardiovascular: rate controlled, S1/S2 + Respiratory: bilateral decreased breath sounds at bases Abdominal: Soft, mildly tender in the left upper quadrant,, ND, bowel  sounds + Extremities: no edema, no cyanosis    The results of significant diagnostics from this hospitalization (including imaging, microbiology, ancillary and laboratory) are listed below for reference.     Microbiology: Recent Results (from the past 240 hour(s))  Resp Panel by RT-PCR (Flu A&B, Covid) Nasopharyngeal Swab     Status: None   Collection Time: 02/20/21  4:56 PM   Specimen: Nasopharyngeal Swab; Nasopharyngeal(NP) swabs in vial transport medium  Result Value Ref Range Status   SARS Coronavirus 2 by RT PCR NEGATIVE NEGATIVE Final    Comment: (NOTE) SARS-CoV-2 target nucleic acids are NOT DETECTED.  The SARS-CoV-2 RNA is generally detectable in upper respiratory specimens during the acute phase of infection. The lowest concentration of SARS-CoV-2 viral copies this assay can detect is 138 copies/mL. A negative result does not preclude SARS-Cov-2 infection and should not be used as the sole basis for treatment or other patient management decisions. A negative result may occur with  improper specimen collection/handling, submission of specimen other than nasopharyngeal swab, presence of viral mutation(s) within the areas targeted by  this assay, and inadequate number of viral copies(<138 copies/mL). A negative result must be combined with clinical observations, patient history, and epidemiological information. The expected result is Negative.  Fact Sheet for Patients:  EntrepreneurPulse.com.au  Fact Sheet for Healthcare Providers:  IncredibleEmployment.be  This test is no t yet approved or cleared by the [place] FDA and  has been authorized for detection and/or diagnosis of SARS-CoV-2 by FDA under an Emergency Use Authorization (EUA). This EUA will remain  in effect (meaning this test can be used) for the duration of the COVID-19 declaration under Section 564(b)(1) of the Act, 21 U.S.C.section 360bbb-3(b)(1), unless the  authorization is terminated  or revoked sooner.       Influenza A by PCR NEGATIVE NEGATIVE Final   Influenza B by PCR NEGATIVE NEGATIVE Final    Comment: (NOTE) The Xpert Xpress SARS-CoV-2/FLU/RSV plus assay is intended as an aid in the diagnosis of influenza from Nasopharyngeal swab specimens and should not be used as a sole basis for treatment. Nasal washings and aspirates are unacceptable for Xpert Xpress SARS-CoV-2/FLU/RSV testing.  Fact Sheet for Patients: EntrepreneurPulse.com.au  Fact Sheet for Healthcare Providers: IncredibleEmployment.be  This test is not yet approved or cleared by the [place] FDA and has been authorized for detection and/or diagnosis of SARS-CoV-2 by FDA under an Emergency Use Authorization (EUA). This EUA will remain in effect (meaning this test can be used) for the duration of the COVID-19 declaration under Section 564(b)(1) of the Act, 21 U.S.C. section 360bbb-3(b)(1), unless the authorization is terminated or revoked.  Performed at [facility], [address] [place], [place] [address]      Labs: BNP (last 3 results) No results for input(s): BNP in the last 8760 hours. Basic Metabolic Panel: Recent Labs  Lab 02/21/21 0619 02/22/21 0746 02/23/21 0045 02/24/21 0436 02/25/21 0414  NA 136 137 136 136 138  K 4.0 3.8 3.7 4.1 4.2  CL 99 100 100 100 101  CO2 31 29 28 28 29   GLUCOSE 119* 108* 119* 161* 132*  BUN 19 17 19 18 17   CREATININE 1.30* 1.18 1.37* 1.10 1.03  CALCIUM 10.9* 10.1 10.8* 9.8 9.9  MG  --  1.6* 1.8 1.8 1.7   Liver Function Tests: Recent Labs  Lab 02/20/21 1335 02/21/21 0619  AST 28 20  ALT 30 23  ALKPHOS 63 56  BILITOT 0.3 0.5  PROT 7.2 6.7  ALBUMIN 3.2* 3.0*   Recent Labs  Lab 02/20/21 1335  LIPASE 18   No results for input(s): AMMONIA in the last 168 hours. CBC: Recent Labs  Lab 02/22/21 0746 02/22/21 1444 02/23/21 0045 02/24/21 0436  02/25/21 0414 02/26/21 0442  WBC 6.2  --  7.3 7.3 10.9* 7.3  NEUTROABS 4.3  --  5.4 6.6 9.3* 5.0  HGB 6.7* 7.0* 8.1* 8.5* 8.0* 8.6*  HCT 22.2* 22.7* 26.3* 27.6* 25.6* 28.3*  MCV 74.7*  --  77.1* 76.5* 77.1* 77.5*  PLT 264  --  273 297 284 300   Cardiac Enzymes: No results for input(s): CKTOTAL, CKMB, CKMBINDEX, TROPONINI in the last 168 hours. BNP: Invalid input(s): POCBNP CBG: Recent Labs  Lab 02/25/21 0936 02/25/21 1248 02/25/21 1719 02/25/21 2015 02/26/21 0817  GLUCAP 158* 138* 139* 109* 88   D-Dimer No results for input(s): DDIMER in the last 72 hours. Hgb A1c No results for input(s): HGBA1C in the last 72 hours. Lipid Profile No results for input(s): CHOL, HDL, LDLCALC, TRIG, CHOLHDL, LDLDIRECT in the last 72 hours.  Thyroid function studies No results for input(s): TSH, T4TOTAL, T3FREE, THYROIDAB in the last 72 hours.  Invalid input(s): FREET3 Anemia work up No results for input(s): VITAMINB12, FOLATE, FERRITIN, TIBC, IRON, RETICCTPCT in the last 72 hours. Urinalysis    Component Value Date/Time   COLORURINE YELLOW (A) 02/20/2021 1629   APPEARANCEUR CLEAR (A) 02/20/2021 1629   LABSPEC 1.010 02/20/2021 1629   PHURINE 5.5 02/20/2021 1629   GLUCOSEU NEGATIVE 02/20/2021 1629   HGBUR NEGATIVE 02/20/2021 1629   BILIRUBINUR NEGATIVE 02/20/2021 1629   KETONESUR NEGATIVE 02/20/2021 1629   PROTEINUR NEGATIVE 02/20/2021 1629   NITRITE NEGATIVE 02/20/2021 1629   LEUKOCYTESUR NEGATIVE 02/20/2021 1629   Sepsis Labs Invalid input(s): PROCALCITONIN,  WBC,  LACTICIDVEN Microbiology Recent Results (from the past 240 hour(s))  Resp Panel by RT-PCR (Flu A&B, Covid) Nasopharyngeal Swab     Status: None   Collection Time: 02/20/21  4:56 PM   Specimen: Nasopharyngeal Swab; Nasopharyngeal(NP) swabs in vial transport medium  Result Value Ref Range Status   SARS Coronavirus 2 by RT PCR NEGATIVE NEGATIVE Final    Comment: (NOTE) SARS-CoV-2 target nucleic acids are NOT  DETECTED.  The SARS-CoV-2 RNA is generally detectable in upper respiratory specimens during the acute phase of infection. The lowest concentration of SARS-CoV-2 viral copies this assay can detect is 138 copies/mL. A negative result does not preclude SARS-Cov-2 infection and should not be used as the sole basis for treatment or other patient management decisions. A negative result may occur with  improper specimen collection/handling, submission of specimen other than nasopharyngeal swab, presence of viral mutation(s) within the areas targeted by this assay, and inadequate number of viral copies(<138 copies/mL). A negative result must be combined with clinical observations, patient history, and epidemiological information. The expected result is Negative.  Fact Sheet for Patients:  EntrepreneurPulse.com.au  Fact Sheet for Healthcare Providers:  IncredibleEmployment.be  This test is no t yet approved or cleared by the [place] FDA and  has been authorized for detection and/or diagnosis of SARS-CoV-2 by FDA under an Emergency Use Authorization (EUA). This EUA will remain  in effect (meaning this test can be used) for the duration of the COVID-19 declaration under Section 564(b)(1) of the Act, 21 U.S.C.section 360bbb-3(b)(1), unless the authorization is terminated  or revoked sooner.       Influenza A by PCR NEGATIVE NEGATIVE Final   Influenza B by PCR NEGATIVE NEGATIVE Final    Comment: (NOTE) The Xpert Xpress SARS-CoV-2/FLU/RSV plus assay is intended as an aid in the diagnosis of influenza from Nasopharyngeal swab specimens and should not be used as a sole basis for treatment. Nasal washings and aspirates are unacceptable for Xpert Xpress SARS-CoV-2/FLU/RSV testing.  Fact Sheet for Patients: EntrepreneurPulse.com.au  Fact Sheet for Healthcare Providers: IncredibleEmployment.be  This test is not yet  approved or cleared by the [place] FDA and has been authorized for detection and/or diagnosis of SARS-CoV-2 by FDA under an Emergency Use Authorization (EUA). This EUA will remain in effect (meaning this test can be used) for the duration of the COVID-19 declaration under Section 564(b)(1) of the Act, 21 U.S.C. section 360bbb-3(b)(1), unless the authorization is terminated or revoked.  Performed at [facility], [address], [place], [place] [address]      Time coordinating discharge: 35 minutes  SIGNED:   Aline August, MD  [facility] 02/26/2021, 10:34 AM

## 2021-02-26 NOTE — Procedures (Signed)
  Procedure: CT bone marrow core biopsy L iliac EBL:   minimal Complications:  none immediate  See full dictation in [facility].  Dillard Cannon MD Main # [phone] Pager  [phone]

## 2021-02-26 NOTE — Progress Notes (Signed)
Discharge instructions reviewed with the patient. IV removed. Patient being sent out via wheelchair to wifes waiting truck

## 2021-02-26 NOTE — Consult Note (Signed)
Chief Complaint: Patient was seen in consultation today for B-cell lymphoma at the request of Earlie Server, MD  Referring Physician(s): Earlie Server, MD  Supervising Physician: Arne Cleveland  Patient Status: [place] - In-pt  History of Present Illness: Zachary Perkins is a 60 y.o. male who presented to the ED on 9/6 with complaints of abdominal pain, nausea, vomiting and constipation. The patient also stated he has had unintentional weight loss. In the ED, CT imaging was done that revealed a large heterogeneous mass of the left upper quadrant with central cavitation centered at the body of the stomach and spleen and involving the splenic artery, splenic vein and tail of the pancreas. Favor gastric or splenic primary neoplasm. Enlarged periportal lymph nodes, concerning for nodal metastatic disease. 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 Patient has been seen by Oncology this admission and request received for bone marrow aspirate and biopsy.   The patient denies any current chest pain, shortness of breath or palpitations. The patient denies any history of sleep apnea or chronic oxygen use. He has no known complications to sedation.    Past Medical History:  Diagnosis Date   Diabetes mellitus without complication ([place])    Hypertension    Hypothyroidism    Morbid obesity ([place])     Past Surgical History:  Procedure Laterality Date   CARPAL TUNNEL RELEASE Left    ESOPHAGOGASTRODUODENOSCOPY (EGD) WITH PROPOFOL N/A 02/21/2021   Procedure: ESOPHAGOGASTRODUODENOSCOPY (EGD) WITH PROPOFOL;  Surgeon: Annamaria Helling, DO;  Location: [facility] ENDOSCOPY;  Service: Gastroenterology;  Laterality: N/A;  after 2pm   INSERTION OF MESH N/A 05/02/2017   Procedure: INSERTION OF MESH;  Surgeon: Leonie Green, MD;  Location: [facility] ORS;  Service: General;  Laterality: N/A;   UMBILICAL HERNIA REPAIR N/A 05/02/2017   Procedure: HERNIA REPAIR UMBILICAL ADULT;  Surgeon:  Leonie Green, MD;  Location: [facility] ORS;  Service: General;  Laterality: N/A;    Allergies: Lisinopril and Metformin and related  Medications: Prior to Admission medications   Medication Sig Start Date End Date Taking? Authorizing Provider  aspirin EC 81 MG tablet Take 81 mg by mouth daily.   Yes [provider]  ergocalciferol (VITAMIN D2) 1.25 MG (50000 UT) capsule Take 1 capsule by mouth twice weekly for 3 months. 03/29/20  Yes [provider]  hydrochlorothiazide (HYDRODIURIL) 25 MG tablet Take 25 mg by mouth daily.   Yes [provider]  ibuprofen (ADVIL,MOTRIN) 200 MG tablet Take 400 mg by mouth every 8 (eight) hours as needed for headache or mild pain.   Yes [provider]  levothyroxine (SYNTHROID) 300 MCG tablet Take 300 mcg by mouth daily. 02/12/21  Yes [provider]  Multiple Vitamin (MULTI-VITAMIN) tablet Take 1 tablet by mouth daily.   Yes [provider]  ondansetron (ZOFRAN) 4 MG tablet Take 1 tablet (4 mg total) by mouth every 8 (eight) hours as needed for nausea or vomiting. 02/26/21  Yes Aline August, MD  potassium chloride (KLOR-CON) 10 MEQ tablet Take 10 mEq by mouth daily. 01/02/21  Yes [provider]  bisacodyl (DULCOLAX) 10 MG suppository Place 1 suppository (10 mg total) rectally daily as needed for severe constipation. 02/26/21   Aline August, MD  oxyCODONE (OXY IR/ROXICODONE) 5 MG immediate release tablet Take 1 tablet (5 mg total) by mouth every 4 (four) hours as needed for moderate pain. 02/26/21   Aline August, MD  pantoprazole (PROTONIX) 40 MG tablet Take 1  tablet (40 mg total) by mouth 2 (two) times daily before a meal. 02/26/21   Aline August, MD  polyethylene glycol (MIRALAX / GLYCOLAX) 17 g packet Take 17 g by mouth daily as needed for moderate constipation. 02/26/21   Aline August, MD  senna-docusate (SENOKOT-S) 8.6-50 MG tablet Take 1 tablet by mouth 2 (two) times daily. 02/26/21    Aline August, MD     History reviewed. No pertinent family history.  Social History   Socioeconomic History   Marital status: Married    Spouse name: Melonie   Number of children: 0   Years of education: 12   Highest education level: 12th grade  Occupational History   Not on file  Tobacco Use   Smoking status: Former    Packs/day: 1.00    Years: 20.00    Pack years: 20.00    Types: Cigarettes    Quit date: 04/25/2007    Years since quitting: 13.8   Smokeless tobacco: Never  Vaping Use   Vaping Use: Never used  Substance and Sexual Activity   Alcohol use: Yes    Comment: rare   Drug use: No   Sexual activity: Yes  Other Topics Concern   Not on file  Social History Narrative   Not on file   Social Determinants of Health   Financial Resource Strain: Not on file  Food Insecurity: Not on file  Transportation Needs: Not on file  Physical Activity: Not on file  Stress: Not on file  Social Connections: Not on file    Review of Systems: A 12 point ROS discussed and pertinent positives are indicated in the HPI above.  All other systems are negative.  Review of Systems  Vital Signs: BP (!) 143/88 (BP Location: Right Arm)   Pulse (!) 56   Temp 98 F (36.7 C) (Oral)   Resp 12   Ht '6\' 5"'$  (1.956 m)   Wt 286 lb 9.6 oz (130 kg)   SpO2 100%   BMI 33.99 kg/m   Physical Exam Constitutional:      Appearance: He is well-developed.  HENT:     Head: Normocephalic and atraumatic.  Cardiovascular:     Rate and Rhythm: Normal rate and regular rhythm.  Pulmonary:     Effort: Pulmonary effort is normal. No respiratory distress.  Skin:    General: Skin is warm and dry.  Neurological:     Mental Status: He is alert and oriented to person, place, and time.    Imaging: CT ABDOMEN PELVIS W CONTRAST  Result Date: 02/20/2021 CLINICAL DATA:  Bowel obstruction suspected EXAM: CT ABDOMEN AND PELVIS WITH CONTRAST TECHNIQUE: Multidetector CT imaging of the abdomen and pelvis  was performed using the standard protocol following bolus administration of intravenous contrast. CONTRAST:  126m OMNIPAQUE IOHEXOL 350 MG/ML SOLN COMPARISON:  None. FINDINGS: Lower chest: Trace left pleural effusion. Hepatobiliary: No focal liver abnormality is seen. No gallstones, gallbladder wall thickening, or biliary dilatation. Pancreas: Pancreatic atrophy. Tail of the pancreas is located adjacent to a large left upper quadrant mass which is described below with no intervening fat plane. Spleen/Stomach: Large heterogeneous mass of the left upper quadrant centered at the body of the stomach and spleen and involving the splenic artery and vein and tail of the pancreas. Mass measures approximately 12.0 x 11.4 cm in axial dimension. Air seen within the mass, likely due to fistulization with the stomach and/or necrosis. Small and large bowel: Normal in caliber with no evidence of wall  thickening or obstruction. Normal appendix. Numerous colonic diverticula, most pronounced in the sigmoid colon. Adrenals/Urinary Tract: Adrenal glands are unremarkable. Kidneys are normal, without renal calculi, focal lesion, or hydronephrosis. Bladder is unremarkable. Vascular/Lymphatic: Enlarged periportal lymph nodes. Reference periportal lymph node measuring 1.4 cm in short axis located on series 2, image 23. Aortic atherosclerosis Reproductive: Prostate is unremarkable. Other: No abdominal wall hernia or abnormality. Trace free fluid in the pelvis. Musculoskeletal: No acute or significant osseous findings. IMPRESSION: Large heterogeneous mass of the left upper quadrant with central cavitation centered at the body of the stomach and spleen and involving the splenic artery, splenic vein and tail of the pancreas. Favor gastric or splenic primary neoplasm. Enlarged periportal lymph nodes, concerning for nodal metastatic disease. Trace left pleural effusion and trace free fluid in the pelvis. Aortic Atherosclerosis (ICD10-I70.0).  Electronically Signed   By: Yetta Glassman M.D.   On: 02/20/2021 16:21   DG Abd 2 Views  Result Date: 02/23/2021 CLINICAL DATA:  Abdominal pain.  Constipation. EXAM: ABDOMEN - 2 VIEW COMPARISON:  None. FINDINGS: No abnormal bowel dilatation is noted. Moderate amount of stool seen throughout the colon. There is no evidence of free air. No radio-opaque calculi or other significant radiographic abnormality is seen. IMPRESSION: Moderate stool burden.  No abnormal bowel dilatation is noted. Electronically Signed   By: Marijo Conception M.D.   On: 02/23/2021 10:01    Labs:  CBC: Recent Labs    02/23/21 0045 02/24/21 0436 02/25/21 0414 02/26/21 0442  WBC 7.3 7.3 10.9* 7.3  HGB 8.1* 8.5* 8.0* 8.6*  HCT 26.3* 27.6* 25.6* 28.3*  PLT 273 297 284 300    COAGS: Recent Labs    02/20/21 1731 02/21/21 0619  INR 1.2 1.2  APTT 29 28    BMP: Recent Labs    02/22/21 0746 02/23/21 0045 02/24/21 0436 02/25/21 0414  NA 137 136 136 138  K 3.8 3.7 4.1 4.2  CL 100 100 100 101  CO2 '29 28 28 29  '$ GLUCOSE 108* 119* 161* 132*  BUN '17 19 18 17  '$ CALCIUM 10.1 10.8* 9.8 9.9  CREATININE 1.18 1.37* 1.10 1.03  GFRNONAA >60 59* >60 >60    LIVER FUNCTION TESTS: Recent Labs    02/20/21 1335 02/21/21 0619  BILITOT 0.3 0.5  AST 28 20  ALT 30 23  ALKPHOS 63 56  PROT 7.2 6.7  ALBUMIN 3.2* 3.0*    Assessment and Plan: 60 year old male who presented to ED 9/6 with complaints of abdominal pain, nausea, vomiting and constipation.  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 Patient has been seen by Oncology this admission and request received for bone marrow aspirate and biopsy.   The patient has been NPO, labs and vitals have been reviewed.  Risks and benefits of bone marrow aspirate and biopsy was discussed with the patient and/or patient's family including, but not limited to  bleeding, infection, damage to adjacent structures or low yield requiring additional tests.  All of the questions were answered and there is agreement to proceed.  Consent signed and in chart.   Thank you for this interesting consult.  I greatly enjoyed meeting Zachary Perkins and look forward to participating in their care.  A copy of this report was sent to the requesting provider on this date.  Electronically Signed: Hedy Jacob, PA-C 02/26/2021, 10:46 AM  I spent a total of 20 Minutes in face to face in clinical consultation, greater than 50% of which was counseling/coordinating care for lymphoma.

## 2021-02-28 ENCOUNTER — Other Ambulatory Visit: Payer: Self-pay

## 2021-02-28 ENCOUNTER — Inpatient Hospital Stay (HOSPITAL_BASED_OUTPATIENT_CLINIC_OR_DEPARTMENT_OTHER): Payer: BC Managed Care – PPO | Admitting: Oncology

## 2021-02-28 ENCOUNTER — Inpatient Hospital Stay: Payer: BC Managed Care – PPO

## 2021-02-28 ENCOUNTER — Inpatient Hospital Stay: Payer: BC Managed Care – PPO | Attending: Oncology

## 2021-02-28 ENCOUNTER — Encounter: Payer: Self-pay | Admitting: Oncology

## 2021-02-28 VITALS — BP 135/84 | HR 64 | Temp 96.6°F | Resp 18 | Ht 76.0 in | Wt 287.0 lb

## 2021-02-28 DIAGNOSIS — K5900 Constipation, unspecified: Secondary | ICD-10-CM | POA: Insufficient documentation

## 2021-02-28 DIAGNOSIS — I428 Other cardiomyopathies: Secondary | ICD-10-CM | POA: Diagnosis not present

## 2021-02-28 DIAGNOSIS — R112 Nausea with vomiting, unspecified: Secondary | ICD-10-CM

## 2021-02-28 DIAGNOSIS — D500 Iron deficiency anemia secondary to blood loss (chronic): Secondary | ICD-10-CM

## 2021-02-28 DIAGNOSIS — K3189 Other diseases of stomach and duodenum: Secondary | ICD-10-CM

## 2021-02-28 DIAGNOSIS — N179 Acute kidney failure, unspecified: Secondary | ICD-10-CM | POA: Diagnosis not present

## 2021-02-28 DIAGNOSIS — G893 Neoplasm related pain (acute) (chronic): Secondary | ICD-10-CM | POA: Diagnosis not present

## 2021-02-28 DIAGNOSIS — Z7189 Other specified counseling: Secondary | ICD-10-CM | POA: Diagnosis not present

## 2021-02-28 DIAGNOSIS — C8338 Diffuse large B-cell lymphoma, lymph nodes of multiple sites: Secondary | ICD-10-CM | POA: Diagnosis present

## 2021-02-28 LAB — CBC WITH DIFFERENTIAL/PLATELET
Abs Immature Granulocytes: 0.08 10*3/uL — ABNORMAL HIGH (ref 0.00–0.07)
Basophils Absolute: 0 10*3/uL (ref 0.0–0.1)
Basophils Relative: 0 %
Eosinophils Absolute: 0.1 10*3/uL (ref 0.0–0.5)
Eosinophils Relative: 1 %
HCT: 29.4 % — ABNORMAL LOW (ref 39.0–52.0)
Hemoglobin: 8.8 g/dL — ABNORMAL LOW (ref 13.0–17.0)
Immature Granulocytes: 1 %
Lymphocytes Relative: 12 %
Lymphs Abs: 0.9 10*3/uL (ref 0.7–4.0)
MCH: 23 pg — ABNORMAL LOW (ref 26.0–34.0)
MCHC: 29.9 g/dL — ABNORMAL LOW (ref 30.0–36.0)
MCV: 77 fL — ABNORMAL LOW (ref 80.0–100.0)
Monocytes Absolute: 0.8 10*3/uL (ref 0.1–1.0)
Monocytes Relative: 10 %
Neutro Abs: 6 10*3/uL (ref 1.7–7.7)
Neutrophils Relative %: 76 %
Platelets: 274 10*3/uL (ref 150–400)
RBC: 3.82 MIL/uL — ABNORMAL LOW (ref 4.22–5.81)
RDW: 18.6 % — ABNORMAL HIGH (ref 11.5–15.5)
WBC: 7.8 10*3/uL (ref 4.0–10.5)
nRBC: 0 % (ref 0.0–0.2)

## 2021-02-28 LAB — RETIC PANEL
Immature Retic Fract: 12.4 % (ref 2.3–15.9)
RBC.: 3.8 MIL/uL — ABNORMAL LOW (ref 4.22–5.81)
Retic Count, Absolute: 50.9 10*3/uL (ref 19.0–186.0)
Retic Ct Pct: 1.3 % (ref 0.4–3.1)
Reticulocyte Hemoglobin: 22.5 pg — ABNORMAL LOW (ref >27.9)

## 2021-02-28 LAB — SAMPLE TO BLOOD BANK

## 2021-02-28 MED ORDER — ONDANSETRON HCL 8 MG PO TABS: 8.0000 mg | ORAL_TABLET | Freq: Three times a day (TID) | ORAL | 1 refills | Status: AC | PRN

## 2021-02-28 MED ORDER — SODIUM CHLORIDE 0.9 % IV SOLN
8.0000 mg | Freq: Once | INTRAVENOUS | Status: AC
  Administered 2021-02-28: 8 mg via INTRAVENOUS
  Filled 2021-02-28: qty 4

## 2021-02-28 MED ORDER — IRON SUCROSE 20 MG/ML IV SOLN
200.0000 mg | Freq: Once | INTRAVENOUS | Status: AC
Start: 2021-02-28 — End: 2021-02-28
  Administered 2021-02-28: 200 mg via INTRAVENOUS
  Filled 2021-02-28: qty 10

## 2021-02-28 MED ORDER — SODIUM CHLORIDE 0.9 % IV SOLN
Freq: Once | INTRAVENOUS | Status: AC
  Filled 2021-02-28: qty 250

## 2021-02-28 MED ORDER — SODIUM CHLORIDE 0.9 % IV SOLN: 200.0000 mg | Freq: Once | INTRAVENOUS | Status: DC

## 2021-02-28 MED ORDER — DEXAMETHASONE SODIUM PHOSPHATE 10 MG/ML IJ SOLN
10.0000 mg | Freq: Once | INTRAMUSCULAR | Status: AC
  Administered 2021-02-28: 10 mg via INTRAVENOUS
  Filled 2021-02-28: qty 1

## 2021-02-28 MED ORDER — PREDNISONE 50 MG PO TABS: 50.0000 mg | ORAL_TABLET | Freq: Every day | ORAL | 0 refills | Status: DC

## 2021-02-28 MED ORDER — PROCHLORPERAZINE MALEATE 10 MG PO TABS: 10.0000 mg | ORAL_TABLET | Freq: Four times a day (QID) | ORAL | 1 refills | Status: AC | PRN

## 2021-02-28 NOTE — Progress Notes (Signed)
New pt seen by Dr Tasia Catchings in hospital.  Pt reports pain in stomach/left abdomen area.  Pt states having some nausea relieved with meds.

## 2021-02-28 NOTE — Patient Instructions (Signed)
[facility] [place] [facility]  Discharge Instructions: Thank you for choosing [facility] to provide your oncology and hematology care.  If you have a lab appointment with the Cancer Center, please go directly to the Cancer Center and check in at the registration area.  Wear comfortable clothing and clothing appropriate for easy access to any Portacath or PICC line.   We strive to give you quality time with your provider. You may need to reschedule your appointment if you arrive late (15 or more minutes).  Arriving late affects you and other patients whose appointments are after yours.  Also, if you miss three or more appointments without notifying the office, you may be dismissed from the clinic at the provider's discretion.      For prescription refill requests, have your pharmacy contact our office and allow 72 hours for refills to be completed.    Today you received the following : Venofer   To help prevent nausea and vomiting after your treatment, we encourage you to take your nausea medication as directed.  BELOW ARE SYMPTOMS THAT SHOULD BE REPORTED IMMEDIATELY: . *FEVER GREATER THAN 100.4 F (38 C) OR HIGHER . *CHILLS OR SWEATING . *NAUSEA AND VOMITING THAT IS NOT CONTROLLED WITH YOUR NAUSEA MEDICATION . *UNUSUAL SHORTNESS OF BREATH . *UNUSUAL BRUISING OR BLEEDING . *URINARY PROBLEMS (pain or burning when urinating, or frequent urination) . *BOWEL PROBLEMS (unusual diarrhea, constipation, pain near the anus) . TENDERNESS IN MOUTH AND THROAT WITH OR WITHOUT PRESENCE OF ULCERS (sore throat, sores in mouth, or a toothache) . UNUSUAL RASH, SWELLING OR PAIN  . UNUSUAL VAGINAL DISCHARGE OR ITCHING   Items with * indicate a potential emergency and should be followed up as soon as possible or go to the Emergency Department if any problems should occur.  Please show the CHEMOTHERAPY ALERT CARD or IMMUNOTHERAPY ALERT CARD at check-in to the Emergency  Department and triage nurse.  Should you have questions after your visit or need to cancel or reschedule your appointment, please contact [facility] [place] [facility]  [phone] and follow the prompts.  Office hours are 8:00 a.m. to 4:30 p.m. Monday - Friday. Please note that voicemails left after 4:00 p.m. may not be returned until the following business day.  We are closed weekends and major holidays. You have access to a nurse at all times for urgent questions. Please call the main number to the clinic [phone] and follow the prompts.  For any non-urgent questions, you may also contact your provider using MyChart. We now offer e-Visits for anyone 18 and older to request care online for non-urgent symptoms. For details visit [email or web address].   Also download the MyChart app! Go to the app store, search "MyChart", open the app, select , and log in with your MyChart username and password.  Due to Covid, a mask is required upon entering the hospital/clinic. If you do not have a mask, one will be given to you upon arrival. For doctor visits, patients may have 1 support person aged 18 or older with them. For treatment visits, patients cannot have anyone with them due to current Covid guidelines and our immunocompromised population.  

## 2021-02-28 NOTE — Progress Notes (Signed)
Hematology/Oncology Follow Up Note [facility]  Telephone:([phone] [phone]  Patient Care Team: Sharyne Peach, MD as PCP - General (Family Medicine) Earlie Server, MD as Consulting Physician (Hematology and Oncology)   Name of the patient: Zachary Perkins  [id number]  10-25-1960   REASON FOR VISIT Follow-up for gastric mass.  INTERVAL HISTORY Patient presented for posthospitalization follow-up for gastric mass.  Patient was evaluated by me during his recent hospitalization. -02/21/2021 EGD showed large fungating ulcerated noncircumferential mass with no bleeding and no stigmata of recent bleeding was found on the greater curvature of the stomach.  Biopsy was taken.  Large amount of necrotic and ulcerated appearing tissue.  Acute duodenitis.  Gastritis Awaiting for pathology report.  PET scan has been scheduled for outpatient. 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 Patient has also received PRBC transfusions as well as IV Venofer treatments daily x3 during hospitalization.  Today he reports feeling tired and fatigued.  Appetite is fair. Constipation, last bowel movement was yesterday.  Small amount.  He takes Senokot 1 tablet twice daily. Left flank pain, intermittent.  He takes pain medication. He has some nausea for which he takes Zofran.  Needs refill.  Review of Systems  Constitutional:  Positive for appetite change, fatigue and unexpected weight change. Negative for chills and fever.  HENT:   Negative for hearing loss and voice change.   Eyes:  Negative for eye problems and icterus.  Respiratory:  Negative for chest tightness, cough and shortness of breath.   Cardiovascular:  Negative for chest pain and leg swelling.  Gastrointestinal:  Positive for abdominal pain and constipation. Negative for abdominal distention.  Endocrine: Negative for hot  flashes.  Genitourinary:  Negative for difficulty urinating, dysuria and frequency.   Musculoskeletal:  Negative for arthralgias.  Skin:  Negative for itching and rash.  Neurological:  Negative for light-headedness and numbness.  Hematological:  Negative for adenopathy. Does not bruise/bleed easily.  Psychiatric/Behavioral:  Negative for confusion.      Allergies  Allergen Reactions   Lisinopril Cough   Losartan Diarrhea   Metformin And Related     PASSED OUT     Past Medical History:  Diagnosis Date   Diabetes mellitus without complication ([place])    Hypertension    Hypothyroidism    Morbid obesity ([place])      Past Surgical History:  Procedure Laterality Date   CARPAL TUNNEL RELEASE Left    ESOPHAGOGASTRODUODENOSCOPY (EGD) WITH PROPOFOL N/A 02/21/2021   Procedure: ESOPHAGOGASTRODUODENOSCOPY (EGD) WITH PROPOFOL;  Surgeon: Annamaria Helling, DO;  Location: [facility] ENDOSCOPY;  Service: Gastroenterology;  Laterality: N/A;  after 2pm   INSERTION OF MESH N/A 05/02/2017   Procedure: INSERTION OF MESH;  Surgeon: Leonie Green, MD;  Location: [facility] ORS;  Service: General;  Laterality: N/A;   UMBILICAL HERNIA REPAIR N/A 05/02/2017   Procedure: HERNIA REPAIR UMBILICAL ADULT;  Surgeon: Leonie Green, MD;  Location: [facility] ORS;  Service: General;  Laterality: N/A;    Social History   Socioeconomic History   Marital status: Married    Spouse name: Melonie   Number of children: 0   Years of education: 12   Highest education level: 12th grade  Occupational History   Not on file  Tobacco Use   Smoking status: Former    Packs/day: 1.00    Years: 20.00    Pack years: 20.00  Types: Cigarettes    Quit date: 04/25/2007    Years since quitting: 13.8   Smokeless tobacco: Never  Vaping Use   Vaping Use: Never used  Substance and Sexual Activity   Alcohol use: Yes    Comment: rare   Drug use: No   Sexual activity: Yes  Other Topics Concern   Not on file  Social  History Narrative   Not on file   Social Determinants of Health   Financial Resource Strain: Not on file  Food Insecurity: Not on file  Transportation Needs: Not on file  Physical Activity: Not on file  Stress: Not on file  Social Connections: Not on file  Intimate Partner Violence: Not on file    History reviewed. No pertinent family history.   Current Outpatient Medications:    bisacodyl (DULCOLAX) 10 MG suppository, Place 1 suppository (10 mg total) rectally daily as needed for severe constipation., Disp: 12 suppository, Rfl: 0   hydrochlorothiazide (HYDRODIURIL) 25 MG tablet, Take 25 mg by mouth daily., Disp: , Rfl:    levothyroxine (SYNTHROID) 300 MCG tablet, Take 300 mcg by mouth daily., Disp: , Rfl:    Multiple Vitamin (MULTI-VITAMIN) tablet, Take 1 tablet by mouth daily., Disp: , Rfl:    ondansetron (ZOFRAN) 4 MG tablet, Take 1 tablet (4 mg total) by mouth every 8 (eight) hours as needed for nausea or vomiting., Disp: 20 tablet, Rfl: 0   ondansetron (ZOFRAN) 8 MG tablet, Take 1 tablet (8 mg total) by mouth every 8 (eight) hours as needed for nausea or vomiting., Disp: 90 tablet, Rfl: 1   oxyCODONE (OXY IR/ROXICODONE) 5 MG immediate release tablet, Take 1 tablet (5 mg total) by mouth every 4 (four) hours as needed for moderate pain., Disp: 14 tablet, Rfl: 0   pantoprazole (PROTONIX) 40 MG tablet, Take 1 tablet (40 mg total) by mouth 2 (two) times daily before a meal., Disp: 60 tablet, Rfl: 0   polyethylene glycol (MIRALAX / GLYCOLAX) 17 g packet, Take 17 g by mouth daily as needed for moderate constipation., Disp: 14 each, Rfl: 0   potassium chloride (KLOR-CON) 10 MEQ tablet, Take 10 mEq by mouth daily., Disp: , Rfl:    predniSONE (DELTASONE) 50 MG tablet, Take 1 tablet (50 mg total) by mouth daily., Disp: 2 tablet, Rfl: 0   prochlorperazine (COMPAZINE) 10 MG tablet, Take 1 tablet (10 mg total) by mouth every 6 (six) hours as needed for nausea or vomiting., Disp: 90 tablet, Rfl:  1   senna-docusate (SENOKOT-S) 8.6-50 MG tablet, Take 1 tablet by mouth 2 (two) times daily., Disp: 30 tablet, Rfl: 0  Physical exam:  Vitals:   02/28/21 1342  BP: 135/84  Pulse: 64  Resp: 18  Temp: (!) 96.6 F (35.9 C)  TempSrc: Tympanic  SpO2: 97%  Weight: 287 lb (130.2 kg)  Height: 6' 4"  (1.93 m)   Physical Exam Constitutional:      General: He is not in acute distress. HENT:     Head: Normocephalic and atraumatic.  Eyes:     General: No scleral icterus. Cardiovascular:     Rate and Rhythm: Normal rate and regular rhythm.     Heart sounds: Normal heart sounds.  Pulmonary:     Effort: Pulmonary effort is normal. No respiratory distress.     Breath sounds: No wheezing.  Abdominal:     General: Bowel sounds are normal. There is no distension.     Palpations: Abdomen is soft.  Musculoskeletal:  General: No deformity. Normal range of motion.     Cervical back: Normal range of motion and neck supple.  Skin:    General: Skin is warm and dry.     Coloration: Skin is pale.     Findings: No erythema or rash.  Neurological:     Mental Status: He is alert and oriented to person, place, and time. Mental status is at baseline.     Cranial Nerves: No cranial nerve deficit.     Coordination: Coordination normal.  Psychiatric:        Mood and Affect: Mood normal.    CMP Latest Ref Rng & Units 02/25/2021  Glucose 70 - 99 mg/dL 132(H)  BUN 6 - 20 mg/dL 17  Creatinine 0.61 - 1.24 mg/dL 1.03  Sodium 135 - 145 mmol/L 138  Potassium 3.5 - 5.1 mmol/L 4.2  Chloride 98 - 111 mmol/L 101  CO2 22 - 32 mmol/L 29  Calcium 8.9 - 10.3 mg/dL 9.9  Total Protein 6.5 - 8.1 g/dL -  Total Bilirubin 0.3 - 1.2 mg/dL -  Alkaline Phos 38 - 126 U/L -  AST 15 - 41 U/L -  ALT 0 - 44 U/L -   CBC Latest Ref Rng & Units 02/28/2021  WBC 4.0 - 10.5 K/uL 7.8  Hemoglobin 13.0 - 17.0 g/dL 8.8(L)  Hematocrit 39.0 - 52.0 % 29.4(L)  Platelets 150 - 400 K/uL 274    RADIOGRAPHIC STUDIES: I have  personally reviewed the radiological images as listed and agreed with the findings in the report. CT ABDOMEN PELVIS W CONTRAST  Result Date: 02/20/2021 CLINICAL DATA:  Bowel obstruction suspected EXAM: CT ABDOMEN AND PELVIS WITH CONTRAST TECHNIQUE: Multidetector CT imaging of the abdomen and pelvis was performed using the standard protocol following bolus administration of intravenous contrast. CONTRAST:  110m OMNIPAQUE IOHEXOL 350 MG/ML SOLN COMPARISON:  None. FINDINGS: Lower chest: Trace left pleural effusion. Hepatobiliary: No focal liver abnormality is seen. No gallstones, gallbladder wall thickening, or biliary dilatation. Pancreas: Pancreatic atrophy. Tail of the pancreas is located adjacent to a large left upper quadrant mass which is described below with no intervening fat plane. Spleen/Stomach: Large heterogeneous mass of the left upper quadrant centered at the body of the stomach and spleen and involving the splenic artery and vein and tail of the pancreas. Mass measures approximately 12.0 x 11.4 cm in axial dimension. Air seen within the mass, likely due to fistulization with the stomach and/or necrosis. Small and large bowel: Normal in caliber with no evidence of wall thickening or obstruction. Normal appendix. Numerous colonic diverticula, most pronounced in the sigmoid colon. Adrenals/Urinary Tract: Adrenal glands are unremarkable. Kidneys are normal, without renal calculi, focal lesion, or hydronephrosis. Bladder is unremarkable. Vascular/Lymphatic: Enlarged periportal lymph nodes. Reference periportal lymph node measuring 1.4 cm in short axis located on series 2, image 23. Aortic atherosclerosis Reproductive: Prostate is unremarkable. Other: No abdominal wall hernia or abnormality. Trace free fluid in the pelvis. Musculoskeletal: No acute or significant osseous findings. IMPRESSION: Large heterogeneous mass of the left upper quadrant with central cavitation centered at the body of the stomach and  spleen and involving the splenic artery, splenic vein and tail of the pancreas. Favor gastric or splenic primary neoplasm. Enlarged periportal lymph nodes, concerning for nodal metastatic disease. Trace left pleural effusion and trace free fluid in the pelvis. Aortic Atherosclerosis (ICD10-I70.0). Electronically Signed   By: LYetta GlassmanM.D.   On: 02/20/2021 16:21   DG Abd 2 Views  Result Date:  02/23/2021 CLINICAL DATA:  Abdominal pain.  Constipation. EXAM: ABDOMEN - 2 VIEW COMPARISON:  None. FINDINGS: No abnormal bowel dilatation is noted. Moderate amount of stool seen throughout the colon. There is no evidence of free air. No radio-opaque calculi or other significant radiographic abnormality is seen. IMPRESSION: Moderate stool burden.  No abnormal bowel dilatation is noted. Electronically Signed   By: Marijo Conception M.D.   On: 02/23/2021 10:01   CT BONE MARROW BIOPSY & ASPIRATION  Result Date: 02/26/2021 CLINICAL DATA:  Lymphoma EXAM: CT GUIDED DEEP ILIAC BONE ASPIRATION AND CORE BIOPSY TECHNIQUE: Patient was placed prone on the CT gantry and limited axial scans through the pelvis were obtained. Appropriate skin entry site was identified. Skin site was marked, prepped with chlorhexidine, draped in usual sterile fashion, and infiltrated locally with 1% lidocaine. Intravenous Fentanyl 137mg and Versed 239mwere administered as conscious sedation during continuous monitoring of the patient's level of consciousness and physiological / cardiorespiratory status by the radiology RN, with a total moderate sedation time of 10 minutes. Under CT fluoroscopic guidance an 11-gauge Cook trocar bone needle was advanced into the left iliac bone just lateral to the sacroiliac joint. Once needle tip position was confirmed, core and aspiration samples were obtained, submitted to pathology for approval. Post procedure scans show no hematoma or fracture. Patient tolerated procedure well. COMPLICATIONS: COMPLICATIONS none  IMPRESSION: 1. Technically successful CT guided left iliac bone core and aspiration biopsy. Electronically Signed   By: D Lucrezia Europe.D.   On: 02/26/2021 13:57     Assessment and plan  1. Gastric mass   2. Goals of care, counseling/discussion   3. Non-intractable vomiting with nausea, unspecified vomiting type   4. Iron deficiency anemia due to chronic blood loss    #Gastric mass status biopsy, splenic involvement. I discussed with pathologist.  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 FISH for MYC, BCL2, BCL6 is pending. Bone marrow biopsy was done on 02/25/2021 and pathology is pending. Discussed about another short course of prednisone challenge.  50 mg of prednisone daily for 2 days starting tomorrow. Check hepatitis panel.  Discussed about systemic chemotherapy, need of Mediport placement, chemotherapy class.  We will finalize the plan once pathology is finalized. Continue pantoprazole.    #Nausea, Zofran 8 mg every 8 hours as needed was sent to his pharmacy. Patient will receive IV Zofran 8 mg x 1 today. Dexamethasone 10 mg x 1 today.  #Left upper quadrant pain, neoplasm related.  Continue oxycodone every 4 hours as needed. #Constipation, continue current bowel regimen. #Iron deficiency anemia, hemoglobin has been stable.  Proceed with IV Venofer 20023meekly x3.  Orders Placed This Encounter  Procedures   Hepatitis panel, acute    Standing Status:   Future    Number of Occurrences:   1    Standing Expiration Date:   02/28/2022    Follow-up TBD.   ZhoEarlie ServerD, PhD Hematology Oncology [facility]/14/2022

## 2021-03-01 ENCOUNTER — Encounter: Payer: Self-pay | Admitting: Oncology

## 2021-03-01 LAB — HEPATITIS PANEL, ACUTE
HCV Ab: NONREACTIVE
Hep A IgM: NONREACTIVE
Hep B C IgM: NONREACTIVE
Hepatitis B Surface Ag: NONREACTIVE

## 2021-03-02 ENCOUNTER — Encounter: Payer: Self-pay | Admitting: Oncology

## 2021-03-05 ENCOUNTER — Other Ambulatory Visit: Payer: Self-pay

## 2021-03-05 ENCOUNTER — Ambulatory Visit: Payer: BC Managed Care – PPO | Admitting: Surgery

## 2021-03-05 ENCOUNTER — Encounter: Payer: Self-pay | Admitting: Surgery

## 2021-03-05 VITALS — BP 133/88 | HR 67 | Temp 98.6°F | Ht 76.0 in | Wt 280.8 lb

## 2021-03-05 DIAGNOSIS — C8299 Follicular lymphoma, unspecified, extranodal and solid organ sites: Secondary | ICD-10-CM | POA: Diagnosis not present

## 2021-03-05 DIAGNOSIS — Z5181 Encounter for therapeutic drug level monitoring: Secondary | ICD-10-CM

## 2021-03-05 DIAGNOSIS — Z79899 Other long term (current) drug therapy: Secondary | ICD-10-CM

## 2021-03-05 MED ORDER — OXYCODONE HCL 5 MG PO TABS: 5.0000 mg | ORAL_TABLET | ORAL | 0 refills | Status: DC | PRN

## 2021-03-05 NOTE — Progress Notes (Addendum)
Outpatient Surgical Follow Up  03/05/2021  Zachary Perkins is an 60 y.o. male.   Chief Complaint  Patient presents with   Follow-up    Port placement     HPI: Patient is well-known to me with a recent hospitalization for gastric mass.  I have personally reviewed the CT showing evidence of a large left upper quadrant mass involving both the stomach and spleen.  EGD showed a large fungating ulcerated mass.  Pathology showed changes consistent with B-cell lymphoma.  Continues to endorse intermittent abdominal pain.  No fevers or chills.  Taking p.o. Specifically denies any migration of the central veins.  No history of pneumothorax.  No chest surgeries. Is a bit frustrated because of the recent diagnosis of because of his pain.  Normal INR.  He does have history of chronic kidney disease with a baseline creatinine of 1.37 rest of the BMP is normal.  CBC shows chronic anemia with a hemoglobin of 8.8 rest of the CBC is completely normal. He did received blood transfusion recently  Past Medical History:  Diagnosis Date   Diabetes mellitus without complication ([place])    Hypertension    Hypothyroidism    Morbid obesity ([place])     Past Surgical History:  Procedure Laterality Date   CARPAL TUNNEL RELEASE Left    ESOPHAGOGASTRODUODENOSCOPY (EGD) WITH PROPOFOL N/A 02/21/2021   Procedure: ESOPHAGOGASTRODUODENOSCOPY (EGD) WITH PROPOFOL;  Surgeon: Annamaria Helling, DO;  Location: [facility] ENDOSCOPY;  Service: Gastroenterology;  Laterality: N/A;  after 2pm   INSERTION OF MESH N/A 05/02/2017   Procedure: INSERTION OF MESH;  Surgeon: Leonie Green, MD;  Location: [facility] ORS;  Service: General;  Laterality: N/A;   lasix eye surgery     UMBILICAL HERNIA REPAIR N/A 05/02/2017   Procedure: HERNIA REPAIR UMBILICAL ADULT;  Surgeon: Leonie Green, MD;  Location: [facility] ORS;  Service: General;  Laterality: N/A;    Family History  Problem Relation Age of Onset   COPD Father    Emphysema Father     Cancer - Lung Brother     Social History:  reports that he quit smoking about 13 years ago. His smoking use included cigarettes. He has a 20.00 pack-year smoking history. He has never used smokeless tobacco. He reports current alcohol use. He reports that he does not use drugs.  Allergies:  Allergies  Allergen Reactions   Lisinopril Cough   Losartan Diarrhea   Metformin And Related     PASSED OUT    Medications reviewed.    ROS Full ROS performed and is otherwise negative other than what is stated in HPI   BP 133/88   Pulse 67   Temp 98.6 F (37 C) (Oral)   Ht '6\' 4"'$  (1.93 m)   Wt 280 lb 12.8 oz (127.4 kg)   SpO2 95%   BMI 34.18 kg/m   Physical Exam Vitals and nursing note reviewed. Exam conducted with a chaperone present.  Constitutional:      General: He is not in acute distress.    Appearance: Normal appearance. He is normal weight.  Eyes:     General: No scleral icterus.       Right eye: No discharge.        Left eye: No discharge.  Cardiovascular:     Rate and Rhythm: Normal rate and regular rhythm.     Heart sounds: Normal heart sounds. No murmur heard.   No gallop.  Pulmonary:     Effort: Pulmonary effort is  normal. No respiratory distress.     Breath sounds: Normal breath sounds. No stridor. No wheezing or rhonchi.  Abdominal:     General: Abdomen is flat. There is no distension.     Palpations: Abdomen is soft. There is no mass.     Tenderness: There is no abdominal tenderness. There is no guarding or rebound.     Hernia: No hernia is present.  Musculoskeletal:     Cervical back: Normal range of motion and neck supple. No rigidity or tenderness.  Lymphadenopathy:     Cervical: No cervical adenopathy.  Skin:    General: Skin is warm and dry.     Capillary Refill: Capillary refill takes less than 2 seconds.     Coloration: Skin is not jaundiced or pale.  Neurological:     General: No focal deficit present.     Mental Status: He is alert and  oriented to person, place, and time.  Psychiatric:        Mood and Affect: Mood normal.        Behavior: Behavior normal.        Thought Content: Thought content normal.        Judgment: Judgment normal.      Assessment/Plan:  1. 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 Procedure discussed with patient and the wife in detail.  Risks, benefits and possible complications including but not limited to: Bleeding, infection, pneumothorax, vascular injury.  He understands and wished to proceed.  We should be able to do it this coming Friday.Caroleen Hamman, MD [facility] General Surgeon

## 2021-03-05 NOTE — Telephone Encounter (Signed)
Patient requested refill on pain medication via Mychart. Last filled by ER doctor. Please advise of refill.

## 2021-03-05 NOTE — H&P (View-Only) (Signed)
Outpatient Surgical Follow Up  03/05/2021  Zachary Perkins is an 60 y.o. male.   Chief Complaint  Patient presents with   Follow-up    Port placement     HPI: Zachary Perkins is well-known to me with a recent hospitalization for gastric mass.  I have personally reviewed the CT showing evidence of a large left upper quadrant mass involving both the stomach and spleen.  EGD showed a large fungating ulcerated mass.  Pathology showed changes consistent with B-cell lymphoma.  Continues to endorse intermittent abdominal pain.  No fevers or chills.  Taking p.o. Specifically denies any migration of the central veins.  No history of pneumothorax.  No chest surgeries. Is a bit frustrated because of the recent diagnosis of because of his pain.  Normal INR.  He does have history of chronic kidney disease with a baseline creatinine of 1.37 rest of the BMP is normal.  CBC shows chronic anemia with a hemoglobin of 8.8 rest of the CBC is completely normal. He did received blood transfusion recently  Past Medical History:  Diagnosis Date   Diabetes mellitus without complication ([place])    Hypertension    Hypothyroidism    Morbid obesity ([place])     Past Surgical History:  Procedure Laterality Date   CARPAL TUNNEL RELEASE Left    ESOPHAGOGASTRODUODENOSCOPY (EGD) WITH PROPOFOL N/A 02/21/2021   Procedure: ESOPHAGOGASTRODUODENOSCOPY (EGD) WITH PROPOFOL;  Surgeon: Annamaria Helling, DO;  Location: [facility] ENDOSCOPY;  Service: Gastroenterology;  Laterality: N/A;  after 2pm   INSERTION OF MESH N/A 05/02/2017   Procedure: INSERTION OF MESH;  Surgeon: Leonie Green, MD;  Location: [facility] ORS;  Service: General;  Laterality: N/A;   lasix eye surgery     UMBILICAL HERNIA REPAIR N/A 05/02/2017   Procedure: HERNIA REPAIR UMBILICAL ADULT;  Surgeon: Leonie Green, MD;  Location: [facility] ORS;  Service: General;  Laterality: N/A;    Family History  Problem Relation Age of Onset   COPD Father    Emphysema Father     Cancer - Lung Brother     Social History:  reports that he quit smoking about 13 years ago. His smoking use included cigarettes. He has a 20.00 pack-year smoking history. He has never used smokeless tobacco. He reports current alcohol use. He reports that he does not use drugs.  Allergies:  Allergies  Allergen Reactions   Lisinopril Cough   Losartan Diarrhea   Metformin And Related     PASSED OUT    Medications reviewed.    ROS Full ROS performed and is otherwise negative other than what is stated in HPI   BP 133/88   Pulse 67   Temp 98.6 F (37 C) (Oral)   Ht 6\' 4"  (1.93 m)   Wt 280 lb 12.8 oz (127.4 kg)   SpO2 95%   BMI 34.18 kg/m   Physical Exam Vitals and nursing note reviewed. Exam conducted with a chaperone present.  Constitutional:      General: He is not in acute distress.    Appearance: Normal appearance. He is normal weight.  Eyes:     General: No scleral icterus.       Right eye: No discharge.        Left eye: No discharge.  Cardiovascular:     Rate and Rhythm: Normal rate and regular rhythm.     Heart sounds: Normal heart sounds. No murmur heard.   No gallop.  Pulmonary:     Effort: Pulmonary effort is  normal. No respiratory distress.     Breath sounds: Normal breath sounds. No stridor. No wheezing or rhonchi.  Abdominal:     General: Abdomen is flat. There is no distension.     Palpations: Abdomen is soft. There is no mass.     Tenderness: There is no abdominal tenderness. There is no guarding or rebound.     Hernia: No hernia is present.  Musculoskeletal:     Cervical back: Normal range of motion and neck supple. No rigidity or tenderness.  Lymphadenopathy:     Cervical: No cervical adenopathy.  Skin:    General: Skin is warm and dry.     Capillary Refill: Capillary refill takes less than 2 seconds.     Coloration: Skin is not jaundiced or pale.  Neurological:     General: No focal deficit present.     Mental Status: He is alert and  oriented to person, place, and time.  Psychiatric:        Mood and Affect: Mood normal.        Behavior: Behavior normal.        Thought Content: Thought content normal.        Judgment: Judgment normal.      Assessment/Plan:  1. 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 Procedure discussed with patient and the wife in detail.  Risks, benefits and possible complications including but not limited to: Bleeding, infection, pneumothorax, vascular injury.  He understands and wished to proceed.  We should be able to do it this coming Friday.Caroleen Hamman, MD [facility] General Surgeon

## 2021-03-05 NOTE — Patient Instructions (Addendum)
Our surgery scheduler will call you within 24-48 hours to schedule your surgery. Please have the [facility] surgery sheet available when speaking with her.    Implanted Port Insertion Implanted port insertion is a procedure to put in a port and catheter. The port is a device with an injectable disk that can be accessed by your health care provider. The port is connected to a vein in the chest or neck by a small flexible tube (catheter). There are different types of ports. The implanted port may be used as a long-term IV access for: Medicines, such as chemotherapy. Fluids. Liquid nutrition, such as total parenteral nutrition (TPN). When you have a port, your health care provider can choose to use the port instead of veins in your arms for these procedures. Tell a health care provider about: Any allergies you have. All medicines you are taking, especially blood thinners, as well as any vitamins, herbs, eye drops, creams, over-the-counter medicines, and steroids. Any problems you or family members have had with anesthetic medicines. Any blood disorders you have. Any surgeries you have had. Any medical conditions you have or have had, including diabetes or kidney problems. Whether you are pregnant or may be pregnant. What are the risks? Generally, this is a safe procedure. However, problems may occur, including: Allergic reactions to medicines or dyes. Damage to other structures or organs. Infection. Damage to the blood vessel, bruising, or bleeding at the puncture site. Blood clot. Breakdown of the skin over the port. A collection of air in the chest that can cause one of the lungs to collapse (pneumothorax). This is rare. What happens before the procedure? Staying hydrated Follow instructions from your health care provider about hydration, which may include: Up to 2 hours before the procedure - you may continue to drink clear liquids, such as water, clear fruit juice, black coffee, and plain  tea.  Eating and drinking restrictions Follow instructions from your health care provider about eating and drinking, which may include: 8 hours before the procedure - stop eating heavy meals or foods, such as meat, fried foods, or fatty foods. 6 hours before the procedure - stop eating light meals or foods, such as toast or cereal. 6 hours before the procedure - stop drinking milk or drinks that contain milk. 2 hours before the procedure - stop drinking clear liquids. Medicines Ask your health care provider about: Changing or stopping your regular medicines. This is especially important if you are taking diabetes medicines or blood thinners. Taking medicines such as aspirin and ibuprofen. These medicines can thin your blood. Do not take these medicines unless your health care provider tells you to take them. Taking over-the-counter medicines, vitamins, herbs, and supplements. General instructions Plan to have someone take you home from the hospital or clinic. If you will be going home right after the procedure, plan to have someone with you for 24 hours. You may have blood tests. Do not use any products that contain nicotine or tobacco for at least 4-6 weeks before the procedure. These products include cigarettes, e-cigarettes, and chewing tobacco. If you need help quitting, ask your health care provider. Ask your health care provider what steps will be taken to help prevent infection. These may include: Removing hair at the surgery site. Washing skin with a germ-killing soap. Taking antibiotic medicine. What happens during the procedure?  An IV will be inserted into one of your veins. You will be given one or more of the following: A medicine to help you  relax (sedative). A medicine to numb the area (local anesthetic). Two small incisions will be made to insert the port. One smaller incision will be made in your neck to get access to the vein where the catheter will lie. The other  incision will be made in the upper chest. This is where the port will lie. The procedure may be done using continuous X-ray (fluoroscopy) or other imaging tools for guidance. The port and catheter will be placed. There may be a small, raised area where the port is. The port will be flushed with a salt solution (saline), and blood will be drawn to make sure that it is working correctly. The incisions will be closed. Bandages (dressings) may be placed over the incisions. The procedure may vary among health care providers and hospitals. What happens after the procedure? Your blood pressure, heart rate, breathing rate, and blood oxygen level will be monitored until you leave the hospital or clinic. Do not drive for 24 hours if you were given a sedative during your procedure. You will be given a manufacturer's information card for the type of port that you have. Keep this with you. Your port will need to be flushed and checked as told by your health care provider, usually every few weeks. A chest X-ray will be done to: Check the placement of the port. Make sure there is no injury to your lung. Summary Implanted port insertion is a procedure to put in a port and catheter. The implanted port is used as a long-term IV access. The port will need to be flushed and checked as told by your health care provider, usually every few weeks. Keep your manufacturer's information card with you at all times. This information is not intended to replace advice given to you by your health care provider. Make sure you discuss any questions you have with your health care provider. Document Revised: 08/23/2020 Document Reviewed: 12/30/2017 Elsevier Patient Education  [place].

## 2021-03-06 ENCOUNTER — Telehealth: Payer: Self-pay | Admitting: Surgery

## 2021-03-06 ENCOUNTER — Encounter (HOSPITAL_COMMUNITY): Payer: Self-pay | Admitting: Oncology

## 2021-03-06 NOTE — Telephone Encounter (Signed)
Patient has been advised of Pre-Admission date/time, COVID Testing date and Surgery date.  Surgery Date: 03/09/21 Preadmission Testing Date: 03/08/21 (phone 1p-5p) Covid Testing Date: Not needed.     Patient has been made aware to call [phone], between 1-3:00pm the day before surgery, to find out what time to arrive for surgery.

## 2021-03-08 ENCOUNTER — Encounter
Admission: RE | Admit: 2021-03-08 | Discharge: 2021-03-08 | Disposition: A | Discharge status: Home / Self Care | Payer: BC Managed Care – PPO | Source: Ambulatory Visit | Attending: Surgery | Admitting: Surgery

## 2021-03-08 ENCOUNTER — Ambulatory Visit
Admission: RE | Admit: 2021-03-08 | Discharge: 2021-03-08 | Disposition: A | Discharge status: Home / Self Care | Payer: BC Managed Care – PPO | Source: Ambulatory Visit | Attending: Oncology | Admitting: Oncology

## 2021-03-08 ENCOUNTER — Other Ambulatory Visit: Payer: Self-pay

## 2021-03-08 ENCOUNTER — Inpatient Hospital Stay: Payer: BC Managed Care – PPO

## 2021-03-08 VITALS — BP 151/91 | HR 72 | Temp 98.1°F | Resp 18

## 2021-03-08 DIAGNOSIS — D500 Iron deficiency anemia secondary to blood loss (chronic): Secondary | ICD-10-CM

## 2021-03-08 DIAGNOSIS — K3189 Other diseases of stomach and duodenum: Secondary | ICD-10-CM | POA: Insufficient documentation

## 2021-03-08 HISTORY — DX: Diffuse large B-cell lymphoma, unspecified site: C83.30

## 2021-03-08 LAB — GLUCOSE, CAPILLARY: Glucose-Capillary: 110 mg/dL — ABNORMAL HIGH (ref 70–99)

## 2021-03-08 MED ORDER — FLUDEOXYGLUCOSE F - 18 (FDG) INJECTION
14.6600 | Freq: Once | INTRAVENOUS | Status: AC | PRN
  Administered 2021-03-08: 14.66 via INTRAVENOUS

## 2021-03-08 MED ORDER — IRON SUCROSE 20 MG/ML IV SOLN
200.0000 mg | Freq: Once | INTRAVENOUS | Status: AC
  Administered 2021-03-08: 200 mg via INTRAVENOUS
  Filled 2021-03-08: qty 10

## 2021-03-08 MED ORDER — SODIUM CHLORIDE 0.9 % IV SOLN
Freq: Once | INTRAVENOUS | Status: AC
  Filled 2021-03-08: qty 250

## 2021-03-08 MED ORDER — SODIUM CHLORIDE 0.9 % IV SOLN: 200.0000 mg | Freq: Once | INTRAVENOUS | Status: DC

## 2021-03-08 NOTE — Patient Instructions (Signed)
[facility]  Discharge Instructions: Thank you for choosing [facility] to provide your oncology and hematology care.  If you have a lab appointment with the Cancer Center, please go directly to the Cancer Center and check in at the registration area.  Wear comfortable clothing and clothing appropriate for easy access to any Portacath or PICC line.   We strive to give you quality time with your provider. You may need to reschedule your appointment if you arrive late (15 or more minutes).  Arriving late affects you and other patients whose appointments are after yours.  Also, if you miss three or more appointments without notifying the office, you may be dismissed from the clinic at the provider's discretion.      For prescription refill requests, have your pharmacy contact our office and allow 72 hours for refills to be completed.    Today you received the following chemotherapy and/or immunotherapy agents VENOFER      To help prevent nausea and vomiting after your treatment, we encourage you to take your nausea medication as directed.  BELOW ARE SYMPTOMS THAT SHOULD BE REPORTED IMMEDIATELY: *FEVER GREATER THAN 100.4 F (38 C) OR HIGHER *CHILLS OR SWEATING *NAUSEA AND VOMITING THAT IS NOT CONTROLLED WITH YOUR NAUSEA MEDICATION *UNUSUAL SHORTNESS OF BREATH *UNUSUAL BRUISING OR BLEEDING *URINARY PROBLEMS (pain or burning when urinating, or frequent urination) *BOWEL PROBLEMS (unusual diarrhea, constipation, pain near the anus) TENDERNESS IN MOUTH AND THROAT WITH OR WITHOUT PRESENCE OF ULCERS (sore throat, sores in mouth, or a toothache) UNUSUAL RASH, SWELLING OR PAIN  UNUSUAL VAGINAL DISCHARGE OR ITCHING   Items with * indicate a potential emergency and should be followed up as soon as possible or go to the Emergency Department if any problems should occur.  Please show the CHEMOTHERAPY ALERT CARD or IMMUNOTHERAPY ALERT CARD at check-in to  the Emergency Department and triage nurse.  Should you have questions after your visit or need to cancel or reschedule your appointment, please contact [facility]  [phone] and follow the prompts.  Office hours are 8:00 a.m. to 4:30 p.m. Monday - Friday. Please note that voicemails left after 4:00 p.m. may not be returned until the following business day.  We are closed weekends and major holidays. You have access to a nurse at all times for urgent questions. Please call the main number to the clinic [phone] and follow the prompts.  For any non-urgent questions, you may also contact your provider using MyChart. We now offer e-Visits for anyone 18 and older to request care online for non-urgent symptoms. For details visit [email or web address].   Also download the MyChart app! Go to the app store, search "MyChart", open the app, select [facility], and log in with your MyChart username and password.  Due to Covid, a mask is required upon entering the hospital/clinic. If you do not have a mask, one will be given to you upon arrival. For doctor visits, patients may have 1 support person aged 18 or older with them. For treatment visits, patients cannot have anyone with them due to current Covid guidelines and our immunocompromised population.   Iron Sucrose Injection What is this medication? IRON SUCROSE (EYE ern SOO krose) treats low levels of iron (iron deficiency anemia) in people with kidney disease. Iron is a mineral that plays an important role in making red blood cells, which carry oxygen from your lungs to the rest of your body. This medicine may   be used for other purposes; ask your health care provider or pharmacist if you have questions. COMMON BRAND NAME(S): Venofer What should I tell my care team before I take this medication? They need to know if you have any of these conditions: Anemia not caused by low iron levels Heart disease High levels  of iron in the blood Kidney disease Liver disease An unusual or allergic reaction to iron, other medications, foods, dyes, or preservatives Pregnant or trying to get pregnant Breast-feeding How should I use this medication? This medication is for infusion into a vein. It is given in a hospital or clinic setting. Talk to your care team about the use of this medication in children. While this medication may be prescribed for children as young as 2 years for selected conditions, precautions do apply. Overdosage: If you think you have taken too much of this medicine contact a poison control center or emergency room at once. NOTE: This medicine is only for you. Do not share this medicine with others. What if I miss a dose? It is important not to miss your dose. Call your care team if you are unable to keep an appointment. What may interact with this medication? Do not take this medication with any of the following: Deferoxamine Dimercaprol Other iron products This medication may also interact with the following: Chloramphenicol Deferasirox This list may not describe all possible interactions. Give your health care provider a list of all the medicines, herbs, non-prescription drugs, or dietary supplements you use. Also tell them if you smoke, drink alcohol, or use illegal drugs. Some items may interact with your medicine. What should I watch for while using this medication? Visit your care team regularly. Tell your care team if your symptoms do not start to get better or if they get worse. You may need blood work done while you are taking this medication. You may need to follow a special diet. Talk to your care team. Foods that contain iron include: whole grains/cereals, dried fruits, beans, or peas, leafy green vegetables, and organ meats (liver, kidney). What side effects may I notice from receiving this medication? Side effects that you should report to your care team as soon as  possible: Allergic reactions-skin rash, itching, hives, swelling of the face, lips, tongue, or throat Low blood pressure-dizziness, feeling faint or lightheaded, blurry vision Shortness of breath Side effects that usually do not require medical attention (report to your care team if they continue or are bothersome): Flushing Headache Joint pain Muscle pain Nausea Pain, redness, or irritation at injection site This list may not describe all possible side effects. Call your doctor for medical advice about side effects. You may report side effects to FDA at 1-800-FDA-1088. Where should I keep my medication? This medication is given in a hospital or clinic and will not be stored at home. NOTE: This sheet is a summary. It may not cover all possible information. If you have questions about this medicine, talk to your doctor, pharmacist, or health care provider.  2022 Elsevier/Gold Standard (2020-08-29 12:52:06)  

## 2021-03-08 NOTE — Patient Instructions (Addendum)
Your procedure is scheduled on: 03/09/2021 Report to the Registration Desk on the 1st floor of the [facility]. ** Arrive at 7:00 AM   REMEMBER: Instructions that are not followed completely may result in serious medical risk, up to and including death; or upon the discretion of your surgeon and anesthesiologist your surgery may need to be rescheduled.  Do not eat food after midnight the night before surgery.  No gum chewing, lozengers or hard candies.  You may however, drink CLEAR liquids up to 2 hours before you are scheduled to arrive for your surgery. Do not drink anything within 2 hours of your scheduled arrival time.  Clear liquids include: - water  - apple juice without pulp - gatorade - black coffee or tea (Do NOT add milk or creamers to the coffee or tea) Do NOT drink anything that is not on this list.   TAKE THESE MEDICATIONS THE MORNING OF SURGERY WITH A SIP OF WATER: - levothyroxine (SYNTHROID) 300 MCG  - pantoprazole (PROTONIX) 40 MG (take one the night before and one on the morning of surgery - helps to prevent nausea after surgery.)    One week prior to surgery: Stop Anti-inflammatories (NSAIDS) such as Advil, Aleve, Ibuprofen, Motrin, Naproxen, Naprosyn and Aspirin based products such as Excedrin, Goodys Powder, BC Powder. You may however, continue to take Tylenol if needed for pain up until the day of surgery. Stop ANY OVER THE COUNTER supplements until after surgery.   No Alcohol for 24 hours before or after surgery.  No Smoking including e-cigarettes for 24 hours prior to surgery.  No chewable tobacco products for at least 6 hours prior to surgery.  No nicotine patches on the day of surgery.  Do not use any "recreational" drugs for at least a week prior to your surgery.  Please be advised that the combination of cocaine and anesthesia may have negative outcomes, up to and including death. If you test positive for cocaine, your surgery will be  cancelled.  On the morning of surgery brush your teeth with toothpaste and water, you may rinse your mouth with mouthwash if you wish. Do not swallow any toothpaste or mouthwash.  Do not wear lotions, powders, or perfumes.   Do not shave body from the neck down 48 hours prior to surgery just in case you cut yourself which could leave a site for infection.  Also, freshly shaved skin may become irritated if using the CHG soap.  Contact lenses, hearing aids and dentures may not be worn into surgery.  Do not bring valuables to the hospital. [facility] is not responsible for any missing/lost belongings or valuables.    Notify your doctor if there is any change in your medical condition (cold, fever, infection).  Wear comfortable clothing (specific to your surgery type) to the hospital.   If you are being discharged the day of surgery, you will not be allowed to drive home. You will need a responsible adult (18 years or older) to drive you home and stay with you that night.   If you are taking public transportation, you will need to have a responsible adult (18 years or older) with you. Please confirm with your physician that it is acceptable to use public transportation.   Please call the [facility] at [phone] if you have any questions about these instructions.  Surgery Visitation Policy:  Patients undergoing a surgery or procedure may have one family member or support person with them as long  as that person is not COVID-19 positive or experiencing its symptoms.  That person may remain in the waiting area during the procedure and may rotate out with other people.  Inpatient Visitation:    Visiting hours are 7 a.m. to 8 p.m. Up to two visitors ages 16+ are allowed at one time in a patient room. The visitors may rotate out with other people during the day. Visitors must check out when they leave, or other visitors will not be allowed. One designated support  person may remain overnight. The visitor must pass COVID-19 screenings, use hand sanitizer when entering and exiting the patient's room and wear a mask at all times, including in the patient's room. Patients must also wear a mask when staff or their visitor are in the room. Masking is required regardless of vaccination status.

## 2021-03-09 ENCOUNTER — Ambulatory Visit: Payer: BC Managed Care – PPO | Admitting: Anesthesiology

## 2021-03-09 ENCOUNTER — Ambulatory Visit: Payer: BC Managed Care – PPO

## 2021-03-09 ENCOUNTER — Encounter: Payer: Self-pay | Admitting: Surgery

## 2021-03-09 ENCOUNTER — Ambulatory Visit
Admission: RE | Admit: 2021-03-09 | Discharge: 2021-03-09 | Disposition: A | Discharge status: Home / Self Care | Payer: BC Managed Care – PPO | Attending: Surgery | Admitting: Surgery

## 2021-03-09 ENCOUNTER — Encounter
Admission: RE | Disposition: A | Discharge status: Home / Self Care | Payer: Self-pay | Source: Home / Self Care | Attending: Surgery

## 2021-03-09 DIAGNOSIS — C8590 Non-Hodgkin lymphoma, unspecified, unspecified site: Secondary | ICD-10-CM | POA: Diagnosis present

## 2021-03-09 DIAGNOSIS — Z6834 Body mass index (BMI) 34.0-34.9, adult: Secondary | ICD-10-CM | POA: Diagnosis not present

## 2021-03-09 DIAGNOSIS — Z87891 Personal history of nicotine dependence: Secondary | ICD-10-CM | POA: Diagnosis not present

## 2021-03-09 DIAGNOSIS — C8333 Diffuse large B-cell lymphoma, intra-abdominal lymph nodes: Secondary | ICD-10-CM

## 2021-03-09 DIAGNOSIS — C8299 Follicular lymphoma, unspecified, extranodal and solid organ sites: Secondary | ICD-10-CM

## 2021-03-09 DIAGNOSIS — C8589 Other specified types of non-Hodgkin lymphoma, extranodal and solid organ sites: Secondary | ICD-10-CM | POA: Diagnosis not present

## 2021-03-09 DIAGNOSIS — E119 Type 2 diabetes mellitus without complications: Secondary | ICD-10-CM | POA: Diagnosis not present

## 2021-03-09 DIAGNOSIS — I10 Essential (primary) hypertension: Secondary | ICD-10-CM | POA: Diagnosis not present

## 2021-03-09 DIAGNOSIS — Z95828 Presence of other vascular implants and grafts: Secondary | ICD-10-CM

## 2021-03-09 HISTORY — PX: PORTACATH PLACEMENT: SHX2246

## 2021-03-09 LAB — BASIC METABOLIC PANEL
Anion gap: 9 (ref 5–15)
BUN: 24 mg/dL — ABNORMAL HIGH (ref 6–20)
CO2: 30 mmol/L (ref 22–32)
Calcium: 12.4 mg/dL — ABNORMAL HIGH (ref 8.9–10.3)
Chloride: 95 mmol/L — ABNORMAL LOW (ref 98–111)
Creatinine, Ser: 1.84 mg/dL — ABNORMAL HIGH (ref 0.61–1.24)
GFR, Estimated: 42 mL/min — ABNORMAL LOW (ref >60)
Glucose, Bld: 126 mg/dL — ABNORMAL HIGH (ref 70–99)
Potassium: 3.8 mmol/L (ref 3.5–5.1)
Sodium: 134 mmol/L — ABNORMAL LOW (ref 135–145)

## 2021-03-09 LAB — CBC
HCT: 30.8 % — ABNORMAL LOW (ref 39.0–52.0)
Hemoglobin: 9.8 g/dL — ABNORMAL LOW (ref 13.0–17.0)
MCH: 24 pg — ABNORMAL LOW (ref 26.0–34.0)
MCHC: 31.8 g/dL (ref 30.0–36.0)
MCV: 75.5 fL — ABNORMAL LOW (ref 80.0–100.0)
Platelets: 290 10*3/uL (ref 150–400)
RBC: 4.08 MIL/uL — ABNORMAL LOW (ref 4.22–5.81)
RDW: 18.3 % — ABNORMAL HIGH (ref 11.5–15.5)
WBC: 10.4 10*3/uL (ref 4.0–10.5)
nRBC: 0 % (ref 0.0–0.2)

## 2021-03-09 LAB — GLUCOSE, CAPILLARY: Glucose-Capillary: 113 mg/dL — ABNORMAL HIGH (ref 70–99)

## 2021-03-09 SURGERY — INSERTION, TUNNELED CENTRAL VENOUS DEVICE, WITH PORT
Anesthesia: General | Site: Chest | Laterality: Right

## 2021-03-09 MED ORDER — FENTANYL CITRATE (PF) 100 MCG/2ML IJ SOLN: 25.0000 ug | INTRAMUSCULAR | Status: DC | PRN

## 2021-03-09 MED ORDER — OXYCODONE HCL 5 MG PO TABS
5.0000 mg | ORAL_TABLET | Freq: Once | ORAL | Status: DC | PRN
Start: 2021-03-09 — End: 2021-03-09

## 2021-03-09 MED ORDER — ACETAMINOPHEN 10 MG/ML IV SOLN: 1000.0000 mg | Freq: Once | INTRAVENOUS | Status: DC | PRN

## 2021-03-09 MED ORDER — GABAPENTIN 300 MG PO CAPS: 300.0000 mg | ORAL_CAPSULE | ORAL | Status: AC

## 2021-03-09 MED ORDER — CHLORHEXIDINE GLUCONATE 0.12 % MT SOLN
OROMUCOSAL | Status: AC
  Administered 2021-03-09: 15 mL via OROMUCOSAL
  Filled 2021-03-09: qty 15

## 2021-03-09 MED ORDER — GLYCOPYRROLATE 0.2 MG/ML IJ SOLN
INTRAMUSCULAR | Status: DC | PRN
  Administered 2021-03-09: .2 mg via INTRAVENOUS

## 2021-03-09 MED ORDER — LIDOCAINE HCL (CARDIAC) PF 100 MG/5ML IV SOSY
PREFILLED_SYRINGE | INTRAVENOUS | Status: DC | PRN
  Administered 2021-03-09: 100 mg via INTRAVENOUS

## 2021-03-09 MED ORDER — CHLORHEXIDINE GLUCONATE CLOTH 2 % EX PADS: 6.0000 | MEDICATED_PAD | Freq: Once | CUTANEOUS | Status: DC

## 2021-03-09 MED ORDER — GABAPENTIN 300 MG PO CAPS
ORAL_CAPSULE | ORAL | Status: AC
  Administered 2021-03-09: 300 mg via ORAL
  Filled 2021-03-09: qty 1

## 2021-03-09 MED ORDER — PROMETHAZINE HCL 25 MG/ML IJ SOLN: 6.2500 mg | INTRAMUSCULAR | Status: DC | PRN

## 2021-03-09 MED ORDER — ACETAMINOPHEN 500 MG PO TABS: 1000.0000 mg | ORAL_TABLET | ORAL | Status: AC

## 2021-03-09 MED ORDER — SUGAMMADEX SODIUM 500 MG/5ML IV SOLN
INTRAVENOUS | Status: DC | PRN
  Administered 2021-03-09: 300 mg via INTRAVENOUS

## 2021-03-09 MED ORDER — DEXAMETHASONE SODIUM PHOSPHATE 10 MG/ML IJ SOLN
INTRAMUSCULAR | Status: DC | PRN
  Administered 2021-03-09: 10 mg via INTRAVENOUS

## 2021-03-09 MED ORDER — PROMETHAZINE HCL 25 MG/ML IJ SOLN
6.2500 mg | INTRAMUSCULAR | Status: DC | PRN
Start: 2021-03-09 — End: 2021-03-09

## 2021-03-09 MED ORDER — DROPERIDOL 2.5 MG/ML IJ SOLN
0.6250 mg | Freq: Once | INTRAMUSCULAR | Status: DC | PRN
Start: 2021-03-09 — End: 2021-03-09
  Filled 2021-03-09: qty 2

## 2021-03-09 MED ORDER — HEPARIN 5000 UNITS IN NS 1000 ML (FLUSH)
INTRAMUSCULAR | Status: DC | PRN
  Administered 2021-03-09: 1 mL via INTRAMUSCULAR

## 2021-03-09 MED ORDER — BUPIVACAINE-EPINEPHRINE (PF) 0.25% -1:200000 IJ SOLN
INTRAMUSCULAR | Status: DC | PRN
  Administered 2021-03-09: 5 mL

## 2021-03-09 MED ORDER — OXYCODONE HCL 5 MG/5ML PO SOLN: 5.0000 mg | Freq: Once | ORAL | Status: DC | PRN

## 2021-03-09 MED ORDER — LIDOCAINE HCL (PF) 1 % IJ SOLN
INTRAMUSCULAR | Status: DC | PRN
  Administered 2021-03-09: 5 mL

## 2021-03-09 MED ORDER — FENTANYL CITRATE (PF) 100 MCG/2ML IJ SOLN
INTRAMUSCULAR | Status: DC | PRN
  Administered 2021-03-09 (×2): 50 ug via INTRAVENOUS

## 2021-03-09 MED ORDER — CHLORHEXIDINE GLUCONATE 0.12 % MT SOLN: 15.0000 mL | Freq: Once | OROMUCOSAL | Status: AC

## 2021-03-09 MED ORDER — OXYCODONE HCL 5 MG PO TABS: 5.0000 mg | ORAL_TABLET | Freq: Once | ORAL | Status: DC | PRN

## 2021-03-09 MED ORDER — SODIUM CHLORIDE 0.9 % IV SOLN
INTRAVENOUS | Status: AC | PRN
  Administered 2021-03-09: 500 mL via INTRAMUSCULAR

## 2021-03-09 MED ORDER — LACTATED RINGERS IV SOLN: INTRAVENOUS | Status: DC

## 2021-03-09 MED ORDER — BUPIVACAINE-EPINEPHRINE (PF) 0.25% -1:200000 IJ SOLN
INTRAMUSCULAR | Status: AC
  Filled 2021-03-09: qty 30

## 2021-03-09 MED ORDER — ONDANSETRON HCL 4 MG/2ML IJ SOLN
INTRAMUSCULAR | Status: DC | PRN
  Administered 2021-03-09: 4 mg via INTRAVENOUS

## 2021-03-09 MED ORDER — SUCCINYLCHOLINE CHLORIDE 200 MG/10ML IV SOSY
PREFILLED_SYRINGE | INTRAVENOUS | Status: DC | PRN
  Administered 2021-03-09: 120 mg via INTRAVENOUS

## 2021-03-09 MED ORDER — FENTANYL CITRATE (PF) 100 MCG/2ML IJ SOLN
INTRAMUSCULAR | Status: AC
  Filled 2021-03-09: qty 2

## 2021-03-09 MED ORDER — PROPOFOL 10 MG/ML IV BOLUS
INTRAVENOUS | Status: DC | PRN
  Administered 2021-03-09: 200 mg via INTRAVENOUS

## 2021-03-09 MED ORDER — ACETAMINOPHEN 500 MG PO TABS
ORAL_TABLET | ORAL | Status: AC
  Administered 2021-03-09: 1000 mg via ORAL
  Filled 2021-03-09: qty 2

## 2021-03-09 MED ORDER — ORAL CARE MOUTH RINSE: 15.0000 mL | Freq: Once | OROMUCOSAL | Status: AC

## 2021-03-09 MED ORDER — LIDOCAINE HCL (PF) 1 % IJ SOLN
INTRAMUSCULAR | Status: AC
  Filled 2021-03-09: qty 30

## 2021-03-09 MED ORDER — ROCURONIUM BROMIDE 100 MG/10ML IV SOLN
INTRAVENOUS | Status: DC | PRN
  Administered 2021-03-09: 30 mg via INTRAVENOUS

## 2021-03-09 MED ORDER — HEPARIN SODIUM (PORCINE) 5000 UNIT/ML IJ SOLN
INTRAMUSCULAR | Status: AC
  Filled 2021-03-09: qty 1

## 2021-03-09 MED ORDER — MIDAZOLAM HCL 2 MG/2ML IJ SOLN
INTRAMUSCULAR | Status: AC
  Filled 2021-03-09: qty 2

## 2021-03-09 MED ORDER — MIDAZOLAM HCL 2 MG/2ML IJ SOLN
INTRAMUSCULAR | Status: DC | PRN
  Administered 2021-03-09: 2 mg via INTRAVENOUS

## 2021-03-09 MED ORDER — HYDROCODONE-ACETAMINOPHEN 5-325 MG PO TABS: 1.0000 | ORAL_TABLET | Freq: Four times a day (QID) | ORAL | 0 refills | Status: DC | PRN

## 2021-03-09 MED ORDER — CEFAZOLIN IN SODIUM CHLORIDE 3-0.9 GM/100ML-% IV SOLN
3.0000 g | INTRAVENOUS | Status: AC
  Administered 2021-03-09: 3 g via INTRAVENOUS
  Filled 2021-03-09: qty 100

## 2021-03-09 SURGICAL SUPPLY — 38 items
ADH SKN CLS APL DERMABOND .7 (GAUZE/BANDAGES/DRESSINGS) ×1
APL PRP STRL LF DISP 70% ISPRP (MISCELLANEOUS) ×1
BAG DECANTER FOR FLEXI CONT (MISCELLANEOUS) ×2 IMPLANT
BLADE SURG SZ11 CARB STEEL (BLADE) ×2 IMPLANT
BOOT SUTURE AID YELLOW STND (SUTURE) ×2 IMPLANT
CHLORAPREP W/TINT 26 (MISCELLANEOUS) ×2 IMPLANT
COVER LIGHT HANDLE STERIS (MISCELLANEOUS) ×4 IMPLANT
DERMABOND ADVANCED (GAUZE/BANDAGES/DRESSINGS) ×1
DERMABOND ADVANCED .7 DNX12 (GAUZE/BANDAGES/DRESSINGS) ×1 IMPLANT
DRAPE 3/4 80X56 (DRAPES) ×2 IMPLANT
DRAPE C-ARM XRAY 36X54 (DRAPES) ×4 IMPLANT
DRAPE INCISE IOBAN 66X45 STRL (DRAPES) ×2 IMPLANT
ELECT CAUTERY BLADE 6.4 (BLADE) ×2 IMPLANT
ELECT REM PT RETURN 9FT ADLT (ELECTROSURGICAL) ×2
ELECTRODE REM PT RTRN 9FT ADLT (ELECTROSURGICAL) ×1 IMPLANT
GAUZE 4X4 16PLY ~~LOC~~+RFID DBL (SPONGE) ×2 IMPLANT
GEL ULTRASOUND 20GR AQUASONIC (MISCELLANEOUS) ×2 IMPLANT
GLOVE SURG ENC MOIS LTX SZ7 (GLOVE) ×2 IMPLANT
GOWN STRL REUS W/ TWL LRG LVL3 (GOWN DISPOSABLE) ×2 IMPLANT
GOWN STRL REUS W/TWL LRG LVL3 (GOWN DISPOSABLE) ×4
IV NS 500ML (IV SOLUTION) ×2
IV NS 500ML BAXH (IV SOLUTION) ×1 IMPLANT
KIT PORT POWER 8FR ISP CVUE (Port) ×2 IMPLANT
MANIFOLD NEPTUNE II (INSTRUMENTS) ×2 IMPLANT
NEEDLE HYPO 22GX1.5 SAFETY (NEEDLE) ×2 IMPLANT
PACK PORT-A-CATH (MISCELLANEOUS) ×2 IMPLANT
SPONGE T-LAP 18X18 ~~LOC~~+RFID (SPONGE) ×2 IMPLANT
SUT MNCRL AB 4-0 PS2 18 (SUTURE) ×2 IMPLANT
SUT PROLENE 2-0 (SUTURE) ×2
SUT PROLENE 2-0 RB1 36X2 ARM (SUTURE) ×1
SUT VIC AB 3-0 SH 27 (SUTURE) ×2
SUT VIC AB 3-0 SH 27X BRD (SUTURE) ×1 IMPLANT
SUTURE PROLEN 2-0 RB1 36X2 ARM (SUTURE) ×1 IMPLANT
SYR 10ML LL (SYRINGE) ×4 IMPLANT
SYR 20ML LL LF (SYRINGE) ×2 IMPLANT
SYR 5ML LL (SYRINGE) ×2 IMPLANT
TOWEL OR 17X26 4PK STRL BLUE (TOWEL DISPOSABLE) ×2 IMPLANT
WATER STERILE IRR 500ML POUR (IV SOLUTION) IMPLANT

## 2021-03-09 NOTE — Anesthesia Preprocedure Evaluation (Addendum)
Anesthesia Evaluation  Patient identified by MRN, date of birth, ID band Patient awake    Reviewed: Allergy & Precautions, NPO status , Patient's Chart, lab work & pertinent test results  History of Anesthesia Complications Negative for: history of anesthetic complications  Airway Mallampati: III  TM Distance: <3 FB Neck ROM: full    Dental no notable dental hx.    Pulmonary neg pulmonary ROS, neg shortness of breath, former smoker,    Pulmonary exam normal        Cardiovascular Exercise Tolerance: Good hypertension, Pt. on medications (-) anginaNormal cardiovascular exam     Neuro/Psych negative neurological ROS  negative psych ROS   GI/Hepatic Neg liver ROS, GERD  ,Nausea with GERD   Endo/Other  diabetesHypothyroidism   Renal/GU negative Renal ROS  negative genitourinary   Musculoskeletal   Abdominal (+) + obese,   Peds  Hematology  (+) anemia , Diffuse large B cell lymphoma    Anesthesia Other Findings Past Medical History: No date: Hypertension No date: Hypothyroidism No date: obesity [facility])  Past Surgical History: No date: CARPAL TUNNEL RELEASE; Left 05/02/2017: INSERTION OF MESH; N/A     Comment:  Procedure: INSERTION OF MESH;  Surgeon: Leonie Green, MD;  Location: [facility] ORS;  Service: General;                Laterality: N/A; 82/70/7867: UMBILICAL HERNIA REPAIR; N/A     Comment:  Procedure: HERNIA REPAIR UMBILICAL ADULT;  Surgeon:               Leonie Green, MD;  Location: [facility] ORS;  Service:               General;  Laterality: N/A;  BMI    Body Mass Index: 33.96 kg/m      Reproductive/Obstetrics negative OB ROS                           Anesthesia Physical  Anesthesia Plan  ASA: 3  Anesthesia Plan: General   Post-op Pain Management:    Induction: Intravenous  PONV Risk Score and Plan: 2 and Ondansetron, Propofol infusion and  Promethazine  Airway Management Planned: Oral ETT  Additional Equipment:   Intra-op Plan:   Post-operative Plan: Extubation in OR  Informed Consent: I have reviewed the patients History and Physical, chart, labs and discussed the procedure including the risks, benefits and alternatives for the proposed anesthesia with the patient or authorized representative who has indicated his/her understanding and acceptance.     Dental Advisory Given and Dental advisory given  Plan Discussed with: Anesthesiologist, CRNA and Surgeon  Anesthesia Plan Comments: (Patient consented for risks of anesthesia including but not limited to:  - adverse reactions to medications - risk of airway placement if required - damage to eyes, teeth, lips or other oral mucosa - nerve damage due to positioning  - sore throat or hoarseness - Damage to heart, brain, nerves, lungs, other parts of body or loss of life  Patient voiced understanding.)      Anesthesia Quick Evaluation

## 2021-03-09 NOTE — Anesthesia Postprocedure Evaluation (Signed)
Anesthesia Post Note  Patient: Zachary Perkins  Procedure(s) Performed: INSERTION PORT-A-CATH (Right: Chest)  Patient location during evaluation: PACU Anesthesia Type: General Level of consciousness: awake and alert Pain management: pain level controlled Vital Signs Assessment: post-procedure vital signs reviewed and stable Respiratory status: spontaneous breathing, nonlabored ventilation and respiratory function stable Cardiovascular status: blood pressure returned to baseline and stable Postop Assessment: no apparent nausea or vomiting Anesthetic complications: no   No notable events documented.   Last Vitals:  Vitals:   03/09/21 1100 03/09/21 1118  BP: 128/75 (!) 144/90  Pulse: (!) 58 (!) 57  Resp: 16 18  Temp: (!) 36.2 C (!) 36.3 C  SpO2: 96% 99%    Last Pain:  Vitals:   03/09/21 1118  TempSrc: Temporal  PainSc: 0-No pain                 [place] Ouch

## 2021-03-09 NOTE — Transfer of Care (Signed)
Immediate Anesthesia Transfer of Care Note  Patient: Nicole Kindred Gene Lindy  Procedure(s) Performed: INSERTION PORT-A-CATH (Right: Chest)  Patient Location: PACU  Anesthesia Type:General  Level of Consciousness: awake, alert  and oriented  Airway & Oxygen Therapy: Patient Spontanous Breathing and Patient connected to face mask oxygen  Post-op Assessment: Report given to RN and Post -op Vital signs reviewed and stable  Post vital signs: Reviewed and stable  Last Vitals:  Vitals Value Taken Time  BP 144/85   Temp    Pulse 66 03/09/21 1042  Resp 19 03/09/21 1042  SpO2 100 % 03/09/21 1042  Vitals shown include unvalidated device data.  Last Pain:  Vitals:   03/09/21 0734  TempSrc: Temporal  PainSc: 0-No pain         Complications: No notable events documented.

## 2021-03-09 NOTE — Interval H&P Note (Signed)
History and Physical Interval Note:  03/09/2021 9:07 AM  Zachary Perkins  has presented today for surgery, with the diagnosis of lymphoma.  The various methods of treatment have been discussed with the patient and family. After consideration of risks, benefits and other options for treatment, the patient has consented to  Procedure(s): INSERTION PORT-A-CATH (N/A) as a surgical intervention.  The patient's history has been reviewed, patient examined, no change in status, stable for surgery.  I have reviewed the patient's chart and labs.  Questions were answered to the patient's satisfaction.     [place]

## 2021-03-09 NOTE — Discharge Instructions (Signed)

## 2021-03-09 NOTE — Anesthesia Procedure Notes (Signed)
Procedure Name: Intubation Date/Time: 03/09/2021 9:27 AM Performed by: Lily Peer, Akita Maxim, CRNA Pre-anesthesia Checklist: Patient identified, Emergency Drugs available, Suction available and Patient being monitored Patient Re-evaluated:Patient Re-evaluated prior to induction Oxygen Delivery Method: Circle system utilized Preoxygenation: Pre-oxygenation with 100% oxygen Induction Type: IV induction, Cricoid Pressure applied and Rapid sequence Laryngoscope Size: McGraph and 4 Grade View: Grade I Tube type: Oral Tube size: 7.5 mm Number of attempts: 1 Airway Equipment and Method: Stylet Placement Confirmation: ETT inserted through vocal cords under direct vision, positive ETCO2 and breath sounds checked- equal and bilateral Secured at: 22 cm Tube secured with: Tape Dental Injury: Teeth and Oropharynx as per pre-operative assessment

## 2021-03-09 NOTE — Op Note (Signed)
  Pre-operative Diagnosis: Lymphoma  Post-operative Diagnosis: Same   Surgeon: Caroleen Hamman, MD FACS  Anesthesia: GETA marcaine .25% w epi and lidocaine 1%  Procedure: right IJ  Port placement with fluoroscopy under U/S guidance  Findings: Good position of the tip of the catheter by fluoroscopy  Estimated Blood Loss: Minimal         Drains: None         Specimens: None       Complications: none            Procedure Details  The patient was seen again in the Holding Room. The benefits, complications, treatment options, and expected outcomes were discussed with the patient. The risks of bleeding, infection, recurrence of symptoms, failure to resolve symptoms,  thrombosis nonfunction breakage pneumothorax hemopneumothorax any of which could require chest tube or further surgery were reviewed with the patient.   The patient was taken to Operating Room, identified as Zachary Perkins and the procedure verified.  A Time Out was held and the above information confirmed.  Prior to the induction of general anesthesia, antibiotic prophylaxis was administered. VTE prophylaxis was in place. Appropriate anesthesia was then administered and tolerated well. The chest was prepped with Chloraprep and draped in the sterile fashion. The patient was positioned in the supine position. Then the patient was placed in Trendelenburg position.  Patient was prepped and draped in sterile fashion and in a Trendelenburg position local anesthetic was infiltrated into the skin and subcutaneous tissues in the neck and anterior chest wall. The large bore needle was placed into the internal jugular vein under U/S guidance without difficulty and then the Seldinger wire was advanced. Fluoroscopy was utilized to confirm that the Seldinger wire was in the superior vena cava.  An incision was made and a port pocket developed with blunt and electrocautery dissection. The introducer dilator was placed over the Seldinger wire  the wire was removed. The previously flushed catheter was placed into the introducer dilator and the peel-away sheath was removed. The catheter length was confirmed and trimmed utilizing fluoroscopy for proper positioning. The catheter was then attached to the previously flushed port. The port was placed into the pocket. The port was held in with 2-0 Prolenes and flushed for function and heparin locked.  The wound was closed with interrupted 3-0 Vicryl followed by 4-0 subcuticular Monocryl sutures. Dermabond used to coat the skin  Patient was taken to the recovery room in stable condition where a postoperative chest film has been ordered.

## 2021-03-10 ENCOUNTER — Encounter: Payer: Self-pay | Admitting: Oncology

## 2021-03-12 ENCOUNTER — Other Ambulatory Visit: Payer: Self-pay

## 2021-03-12 ENCOUNTER — Ambulatory Visit
Admission: RE | Admit: 2021-03-12 | Discharge: 2021-03-12 | Disposition: A | Discharge status: Home / Self Care | Payer: BC Managed Care – PPO | Source: Ambulatory Visit | Attending: Oncology | Admitting: Oncology

## 2021-03-12 DIAGNOSIS — I10 Essential (primary) hypertension: Secondary | ICD-10-CM | POA: Insufficient documentation

## 2021-03-12 DIAGNOSIS — E119 Type 2 diabetes mellitus without complications: Secondary | ICD-10-CM | POA: Diagnosis not present

## 2021-03-12 DIAGNOSIS — Z0189 Encounter for other specified special examinations: Secondary | ICD-10-CM | POA: Diagnosis not present

## 2021-03-12 DIAGNOSIS — Z79899 Other long term (current) drug therapy: Secondary | ICD-10-CM

## 2021-03-12 DIAGNOSIS — Z5181 Encounter for therapeutic drug level monitoring: Secondary | ICD-10-CM | POA: Insufficient documentation

## 2021-03-12 LAB — ECHOCARDIOGRAM COMPLETE
AR max vel: 4.1 cm2
AV Area VTI: 5.2 cm2
AV Area mean vel: 4.3 cm2
AV Mean grad: 2 mmHg
AV Peak grad: 4.5 mmHg
Ao pk vel: 1.07 m/s
Area-P 1/2: 4.68 cm2
S' Lateral: 3.3 cm

## 2021-03-12 LAB — SURGICAL PATHOLOGY

## 2021-03-12 NOTE — Telephone Encounter (Signed)
Brooke please schedule patient for one venofer infusion this week

## 2021-03-12 NOTE — Progress Notes (Signed)
*  PRELIMINARY RESULTS* Echocardiogram 2D Echocardiogram has been performed.  Zachary Perkins 03/12/2021, 10:36 AM

## 2021-03-13 ENCOUNTER — Encounter: Payer: Self-pay | Admitting: Oncology

## 2021-03-13 ENCOUNTER — Encounter: Payer: Self-pay | Admitting: Surgery

## 2021-03-13 ENCOUNTER — Telehealth: Payer: Self-pay

## 2021-03-13 LAB — SURGICAL PATHOLOGY

## 2021-03-13 NOTE — Telephone Encounter (Signed)
03/13/2021 Spoke w/ pt and informed him that chemo education class for *NEW* [facility] has been scheduled for 9/30, and for him to arrive @ 9 am; iron infusion @ 1 pm to stay the same. Pt confirmed first chemo appts (port lab/MD/*new* [facility]) for 10/4 with 8 am arrival  SRW

## 2021-03-13 NOTE — Telephone Encounter (Signed)
please schedule patient for chemo class [facility] this week. Then Lab/MD/ [facility] *new* this week or early next week. Please call pt with appt details.

## 2021-03-14 ENCOUNTER — Other Ambulatory Visit: Payer: Self-pay | Admitting: Oncology

## 2021-03-14 DIAGNOSIS — C8330 Diffuse large B-cell lymphoma, unspecified site: Secondary | ICD-10-CM | POA: Insufficient documentation

## 2021-03-14 DIAGNOSIS — C8338 Diffuse large B-cell lymphoma, lymph nodes of multiple sites: Secondary | ICD-10-CM

## 2021-03-14 MED ORDER — PROMETHAZINE HCL 25 MG RE SUPP: 25.0000 mg | Freq: Four times a day (QID) | RECTAL | 0 refills | Status: AC | PRN

## 2021-03-14 NOTE — Progress Notes (Signed)
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 REGIMEN - Lymphoma and CLL     A cycle is every 21 days:     Prednisone      Rituximab-xxxx      Cyclophosphamide      Doxorubicin      Vincristine   **Always confirm dose/schedule in your pharmacy ordering system**  Patient Characteristics: Diffuse Large B-Cell Lymphoma or Follicular Lymphoma, Grade 3B, First Line, Stage I and II, Bulky Disease (10 cm or > 1/3 Diameter of Chest) Disease Type: Not Applicable Disease Type: Diffuse Large B-Cell Lymphoma Disease Type: Not Applicable Line of therapy: First Line Disease Characteristics: Bulky Disease Intent of Therapy: Curative Intent, Discussed with Patient 

## 2021-03-15 ENCOUNTER — Other Ambulatory Visit: Payer: Self-pay

## 2021-03-15 ENCOUNTER — Inpatient Hospital Stay: Payer: BC Managed Care – PPO

## 2021-03-15 ENCOUNTER — Encounter: Payer: Self-pay | Admitting: Oncology

## 2021-03-15 ENCOUNTER — Inpatient Hospital Stay (HOSPITAL_BASED_OUTPATIENT_CLINIC_OR_DEPARTMENT_OTHER): Payer: BC Managed Care – PPO | Admitting: Oncology

## 2021-03-15 VITALS — BP 139/89 | HR 70 | Temp 97.7°F | Resp 20 | Wt 262.2 lb

## 2021-03-15 DIAGNOSIS — D500 Iron deficiency anemia secondary to blood loss (chronic): Secondary | ICD-10-CM

## 2021-03-15 DIAGNOSIS — Z7189 Other specified counseling: Secondary | ICD-10-CM

## 2021-03-15 DIAGNOSIS — R112 Nausea with vomiting, unspecified: Secondary | ICD-10-CM

## 2021-03-15 DIAGNOSIS — C8338 Diffuse large B-cell lymphoma, lymph nodes of multiple sites: Secondary | ICD-10-CM | POA: Diagnosis not present

## 2021-03-15 DIAGNOSIS — N179 Acute kidney failure, unspecified: Secondary | ICD-10-CM | POA: Insufficient documentation

## 2021-03-15 DIAGNOSIS — R1110 Vomiting, unspecified: Secondary | ICD-10-CM | POA: Insufficient documentation

## 2021-03-15 LAB — COMPREHENSIVE METABOLIC PANEL
ALT: 60 U/L — ABNORMAL HIGH (ref 0–44)
AST: 33 U/L (ref 15–41)
Albumin: 3.1 g/dL — ABNORMAL LOW (ref 3.5–5.0)
Alkaline Phosphatase: 79 U/L (ref 38–126)
Anion gap: 8 (ref 5–15)
BUN: 33 mg/dL — ABNORMAL HIGH (ref 6–20)
CO2: 31 mmol/L (ref 22–32)
Calcium: 14.1 mg/dL (ref 8.9–10.3)
Chloride: 94 mmol/L — ABNORMAL LOW (ref 98–111)
Creatinine, Ser: 1.8 mg/dL — ABNORMAL HIGH (ref 0.61–1.24)
GFR, Estimated: 43 mL/min — ABNORMAL LOW (ref >60)
Glucose, Bld: 144 mg/dL — ABNORMAL HIGH (ref 70–99)
Potassium: 3.4 mmol/L — ABNORMAL LOW (ref 3.5–5.1)
Sodium: 133 mmol/L — ABNORMAL LOW (ref 135–145)
Total Bilirubin: 0.5 mg/dL (ref 0.3–1.2)
Total Protein: 7.5 g/dL (ref 6.5–8.1)

## 2021-03-15 LAB — CBC WITH DIFFERENTIAL/PLATELET
Abs Immature Granulocytes: 0.04 10*3/uL (ref 0.00–0.07)
Basophils Absolute: 0 10*3/uL (ref 0.0–0.1)
Basophils Relative: 0 %
Eosinophils Absolute: 0 10*3/uL (ref 0.0–0.5)
Eosinophils Relative: 1 %
HCT: 30.5 % — ABNORMAL LOW (ref 39.0–52.0)
Hemoglobin: 9.5 g/dL — ABNORMAL LOW (ref 13.0–17.0)
Immature Granulocytes: 1 %
Lymphocytes Relative: 10 %
Lymphs Abs: 0.8 10*3/uL (ref 0.7–4.0)
MCH: 22.8 pg — ABNORMAL LOW (ref 26.0–34.0)
MCHC: 31.1 g/dL (ref 30.0–36.0)
MCV: 73.1 fL — ABNORMAL LOW (ref 80.0–100.0)
Monocytes Absolute: 0.8 10*3/uL (ref 0.1–1.0)
Monocytes Relative: 10 %
Neutro Abs: 6.2 10*3/uL (ref 1.7–7.7)
Neutrophils Relative %: 78 %
Platelets: 287 10*3/uL (ref 150–400)
RBC: 4.17 MIL/uL — ABNORMAL LOW (ref 4.22–5.81)
RDW: 18.5 % — ABNORMAL HIGH (ref 11.5–15.5)
WBC: 7.9 10*3/uL (ref 4.0–10.5)
nRBC: 0 % (ref 0.0–0.2)

## 2021-03-15 LAB — RETIC PANEL
Immature Retic Fract: 11.9 % (ref 2.3–15.9)
RBC.: 4.27 MIL/uL (ref 4.22–5.81)
Retic Count, Absolute: 23.9 10*3/uL (ref 19.0–186.0)
Retic Ct Pct: 0.6 % (ref 0.4–3.1)
Reticulocyte Hemoglobin: 23.5 pg — ABNORMAL LOW (ref >27.9)

## 2021-03-15 MED ORDER — PREDNISONE 20 MG PO TABS: 100.0000 mg | ORAL_TABLET | Freq: Every day | ORAL | 5 refills | Status: DC

## 2021-03-15 MED ORDER — SODIUM CHLORIDE 0.9 % IV SOLN
Freq: Once | INTRAVENOUS | Status: AC
  Filled 2021-03-15: qty 250

## 2021-03-15 MED ORDER — SODIUM CHLORIDE 0.9 % IV SOLN: 200.0000 mg | Freq: Once | INTRAVENOUS | Status: DC

## 2021-03-15 MED ORDER — LIDOCAINE-PRILOCAINE 2.5-2.5 % EX CREA: TOPICAL_CREAM | CUTANEOUS | 3 refills | Status: AC

## 2021-03-15 MED ORDER — DEXAMETHASONE SODIUM PHOSPHATE 10 MG/ML IJ SOLN: 10.0000 mg | Freq: Once | INTRAMUSCULAR | Status: DC

## 2021-03-15 MED ORDER — SODIUM CHLORIDE 0.9 % IV SOLN: Freq: Once | INTRAVENOUS | Status: DC

## 2021-03-15 MED ORDER — HEPARIN SOD (PORK) LOCK FLUSH 100 UNIT/ML IV SOLN
500.0000 [IU] | Freq: Once | INTRAVENOUS | Status: AC
  Administered 2021-03-15: 500 [IU] via INTRAVENOUS
  Filled 2021-03-15: qty 5

## 2021-03-15 MED ORDER — ONDANSETRON HCL 4 MG/2ML IJ SOLN
8.0000 mg | Freq: Once | INTRAMUSCULAR | Status: AC
  Administered 2021-03-15: 8 mg via INTRAVENOUS
  Filled 2021-03-15: qty 4

## 2021-03-15 MED ORDER — SODIUM CHLORIDE 0.9 % IV SOLN
10.0000 mg | Freq: Once | INTRAVENOUS | Status: AC
  Administered 2021-03-15: 10 mg via INTRAVENOUS
  Filled 2021-03-15: qty 1

## 2021-03-15 MED ORDER — SODIUM CHLORIDE 0.9 % IV SOLN: 10.0000 mg | Freq: Once | INTRAVENOUS | Status: DC

## 2021-03-15 MED ORDER — IRON SUCROSE 20 MG/ML IV SOLN
200.0000 mg | Freq: Once | INTRAVENOUS | Status: AC
  Administered 2021-03-15: 200 mg via INTRAVENOUS
  Filled 2021-03-15: qty 10

## 2021-03-15 MED ORDER — ALLOPURINOL 100 MG PO TABS
100.0000 mg | ORAL_TABLET | Freq: Every day | ORAL | 0 refills | Status: DC
Start: 2021-03-15 — End: 2021-04-11

## 2021-03-15 MED ORDER — ONDANSETRON HCL 4 MG/2ML IJ SOLN: 8.0000 mg | Freq: Once | INTRAMUSCULAR | Status: DC

## 2021-03-15 NOTE — Progress Notes (Signed)
Hematology/Oncology Follow Up Note [facility]  Telephone:([phone] [phone]  Patient Care Team: Sharyne Peach, MD as PCP - General (Family Medicine) Earlie Server, MD as Consulting Physician (Hematology and Oncology)   Name of the patient: Zachary Perkins  [id number]  23-Apr-1961   REASON FOR VISIT Follow-up for DLBCL  PERTINENT ONCOLOGY HISTORY -02/21/2021 EGD showed large fungating ulcerated noncircumferential mass with no bleeding and no stigmata of recent bleeding was found on the greater curvature of the stomach.  Biopsy was taken.  Large amount of necrotic and ulcerated appearing tissue.  Acute duodenitis.  Gastritis Awaiting for pathology report.  PET scan has been scheduled for outpatient. 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 Patient has also received PRBC transfusions as well as IV Venofer treatments daily x3 during hospitalization.  Today he reports feeling tired and fatigued.  Appetite is fair. Constipation, last bowel movement was yesterday.  Small amount.  He takes Senokot 1 tablet twice daily. Left flank pain, intermittent.  He takes pain medication.  INTERVAL HISTORY Zachary Perkins is a 60 y.o. male who has above history reviewed by me today presents for follow up visit for DLBCL, acute visit for nausea, vomiting.  Oral zofran and compazine did not help his symptoms. Tried phenergan suppositories, and symptom has improved.  Appetite is poor, he has lost 18 pounds since last visit. Fatigue, he feels improved energy level after IV Venofer treatments.  And fatigue gets worse a few days later.   Review of Systems  Constitutional:  Positive for appetite change, fatigue and unexpected weight change. Negative for chills and fever.  HENT:   Negative for hearing loss and voice change.   Eyes:  Negative for eye problems and icterus.  Respiratory:   Negative for chest tightness, cough and shortness of breath.   Cardiovascular:  Negative for chest pain and leg swelling.  Gastrointestinal:  Positive for abdominal pain and constipation. Negative for abdominal distention.  Endocrine: Negative for hot flashes.  Genitourinary:  Negative for difficulty urinating, dysuria and frequency.   Musculoskeletal:  Negative for arthralgias.  Skin:  Negative for itching and rash.  Neurological:  Negative for light-headedness and numbness.  Hematological:  Negative for adenopathy. Does not bruise/bleed easily.  Psychiatric/Behavioral:  Negative for confusion.      Allergies  Allergen Reactions   Lisinopril Cough   Losartan Diarrhea   Metformin And Related     PASSED OUT     Past Medical History:  Diagnosis Date   Diabetes mellitus without complication ([place])    Diffuse large B cell lymphoma ([place])    Hypertension    Hypothyroidism    Morbid obesity ([place])      Past Surgical History:  Procedure Laterality Date   CARPAL TUNNEL RELEASE Left    ESOPHAGOGASTRODUODENOSCOPY (EGD) WITH PROPOFOL N/A 02/21/2021   Procedure: ESOPHAGOGASTRODUODENOSCOPY (EGD) WITH PROPOFOL;  Surgeon: Annamaria Helling, DO;  Location: [facility] ENDOSCOPY;  Service: Gastroenterology;  Laterality: N/A;  after 2pm   INSERTION OF MESH N/A 05/02/2017   Procedure: INSERTION OF MESH;  Surgeon: Leonie Green, MD;  Location: [facility] ORS;  Service: General;  Laterality: N/A;   lasix eye surgery     PORTACATH PLACEMENT Right 03/09/2021   Procedure: INSERTION PORT-A-CATH;  Surgeon: Jules Husbands, MD;  Location: [facility] ORS;  Service: General;  Laterality: Right;   UMBILICAL HERNIA REPAIR N/A 05/02/2017   Procedure:  HERNIA REPAIR UMBILICAL ADULT;  Surgeon: Leonie Green, MD;  Location: [facility] ORS;  Service: General;  Laterality: N/A;    Social History   Socioeconomic History   Marital status: Married    Spouse name: Melonie   Number of children: 0   Years of education:  12   Highest education level: 12th grade  Occupational History   Not on file  Tobacco Use   Smoking status: Former    Packs/day: 1.00    Years: 20.00    Pack years: 20.00    Types: Cigarettes    Quit date: 04/25/2007    Years since quitting: 13.8   Smokeless tobacco: Never  Vaping Use   Vaping Use: Never used  Substance and Sexual Activity   Alcohol use: Not Currently    Comment: rare   Drug use: No   Sexual activity: Yes  Other Topics Concern   Not on file  Social History Narrative   Not on file   Social Determinants of Health   Financial Resource Strain: Not on file  Food Insecurity: Not on file  Transportation Needs: Not on file  Physical Activity: Not on file  Stress: Not on file  Social Connections: Not on file  Intimate Partner Violence: Not on file    Family History  Problem Relation Age of Onset   COPD Father    Emphysema Father    Cancer - Lung Brother      Current Outpatient Medications:    allopurinol (ZYLOPRIM) 100 MG tablet, Take 1 tablet (100 mg total) by mouth daily., Disp: 30 tablet, Rfl: 0   docusate sodium (COLACE) 100 MG capsule, Take 200 mg by mouth daily., Disp: , Rfl:    hydrochlorothiazide (HYDRODIURIL) 25 MG tablet, Take 25 mg by mouth daily., Disp: , Rfl:    levothyroxine (SYNTHROID) 300 MCG tablet, Take 300 mcg by mouth daily., Disp: , Rfl:    Multiple Vitamin (MULTI-VITAMIN) tablet, Take 1 tablet by mouth daily., Disp: , Rfl:    oxyCODONE (OXY IR/ROXICODONE) 5 MG immediate release tablet, Take 1 tablet (5 mg total) by mouth every 4 (four) hours as needed for moderate pain., Disp: 60 tablet, Rfl: 0   pantoprazole (PROTONIX) 40 MG tablet, Take 1 tablet (40 mg total) by mouth 2 (two) times daily before a meal., Disp: 60 tablet, Rfl: 0   polyethylene glycol (MIRALAX / GLYCOLAX) 17 g packet, Take 17 g by mouth daily as needed for moderate constipation. (Patient taking differently: Take 17 g by mouth daily.), Disp: 14 each, Rfl: 0   potassium  chloride (KLOR-CON) 10 MEQ tablet, Take 10 mEq by mouth daily., Disp: , Rfl:    promethazine (PHENERGAN) 25 MG suppository, Place 1 suppository (25 mg total) rectally every 6 (six) hours as needed for nausea or vomiting., Disp: 24 each, Rfl: 0   bisacodyl (DULCOLAX) 10 MG suppository, Place 1 suppository (10 mg total) rectally daily as needed for severe constipation. (Patient not taking: Reported on 03/15/2021), Disp: 12 suppository, Rfl: 0   lidocaine-prilocaine (EMLA) cream, Apply to affected area once, Disp: 30 g, Rfl: 3   ondansetron (ZOFRAN) 8 MG tablet, Take 1 tablet (8 mg total) by mouth every 8 (eight) hours as needed for nausea or vomiting. (Patient not taking: Reported on 03/15/2021), Disp: 90 tablet, Rfl: 1   predniSONE (DELTASONE) 20 MG tablet, Take 5 tablets (100 mg total) by mouth daily. Take with food on days 2-5 of chemotherapy., Disp: 25 tablet, Rfl: 5   prochlorperazine (COMPAZINE)  10 MG tablet, Take 1 tablet (10 mg total) by mouth every 6 (six) hours as needed for nausea or vomiting. (Patient not taking: Reported on 03/15/2021), Disp: 90 tablet, Rfl: 1 No current facility-administered medications for this visit.  Facility-Administered Medications Ordered in Other Visits:    ondansetron (ZOFRAN) injection 8 mg, 8 mg, Intravenous, Once, Earlie Server, MD  Physical exam:  Vitals:   03/15/21 1047  BP: 139/89  Pulse: 70  Resp: 20  Temp: 97.7 F (36.5 C)  SpO2: 98%  Weight: 262 lb 3.8 oz (118.9 kg)   Physical Exam Constitutional:      General: He is not in acute distress. HENT:     Head: Normocephalic and atraumatic.  Eyes:     General: No scleral icterus. Cardiovascular:     Rate and Rhythm: Normal rate and regular rhythm.     Heart sounds: Normal heart sounds.  Pulmonary:     Effort: Pulmonary effort is normal. No respiratory distress.     Breath sounds: No wheezing.  Abdominal:     General: Bowel sounds are normal. There is no distension.     Palpations: Abdomen is soft.   Musculoskeletal:        General: No deformity. Normal range of motion.     Cervical back: Normal range of motion and neck supple.  Skin:    General: Skin is warm and dry.     Coloration: Skin is pale.     Findings: No erythema or rash.  Neurological:     Mental Status: He is alert and oriented to person, place, and time. Mental status is at baseline.     Cranial Nerves: No cranial nerve deficit.     Coordination: Coordination normal.  Psychiatric:        Mood and Affect: Mood normal.    CMP Latest Ref Rng & Units 03/15/2021  Glucose 70 - 99 mg/dL 144(H)  BUN 6 - 20 mg/dL 33(H)  Creatinine 0.61 - 1.24 mg/dL 1.80(H)  Sodium 135 - 145 mmol/L 133(L)  Potassium 3.5 - 5.1 mmol/L 3.4(L)  Chloride 98 - 111 mmol/L 94(L)  CO2 22 - 32 mmol/L 31  Calcium 8.9 - 10.3 mg/dL 14.1(HH)  Total Protein 6.5 - 8.1 g/dL 7.5  Total Bilirubin 0.3 - 1.2 mg/dL 0.5  Alkaline Phos 38 - 126 U/L 79  AST 15 - 41 U/L 33  ALT 0 - 44 U/L 60(H)   CBC Latest Ref Rng & Units 03/15/2021  WBC 4.0 - 10.5 K/uL 7.9  Hemoglobin 13.0 - 17.0 g/dL 9.5(L)  Hematocrit 39.0 - 52.0 % 30.5(L)  Platelets 150 - 400 K/uL 287    RADIOGRAPHIC STUDIES: I have personally reviewed the radiological images as listed and agreed with the findings in the report. CT ABDOMEN PELVIS W CONTRAST  Result Date: 02/20/2021 CLINICAL DATA:  Bowel obstruction suspected EXAM: CT ABDOMEN AND PELVIS WITH CONTRAST TECHNIQUE: Multidetector CT imaging of the abdomen and pelvis was performed using the standard protocol following bolus administration of intravenous contrast. CONTRAST:  142m OMNIPAQUE IOHEXOL 350 MG/ML SOLN COMPARISON:  None. FINDINGS: Lower chest: Trace left pleural effusion. Hepatobiliary: No focal liver abnormality is seen. No gallstones, gallbladder wall thickening, or biliary dilatation. Pancreas: Pancreatic atrophy. Tail of the pancreas is located adjacent to a large left upper quadrant mass which is described below with no intervening  fat plane. Spleen/Stomach: Large heterogeneous mass of the left upper quadrant centered at the body of the stomach and spleen and involving the splenic artery  and vein and tail of the pancreas. Mass measures approximately 12.0 x 11.4 cm in axial dimension. Air seen within the mass, likely due to fistulization with the stomach and/or necrosis. Small and large bowel: Normal in caliber with no evidence of wall thickening or obstruction. Normal appendix. Numerous colonic diverticula, most pronounced in the sigmoid colon. Adrenals/Urinary Tract: Adrenal glands are unremarkable. Kidneys are normal, without renal calculi, focal lesion, or hydronephrosis. Bladder is unremarkable. Vascular/Lymphatic: Enlarged periportal lymph nodes. Reference periportal lymph node measuring 1.4 cm in short axis located on series 2, image 23. Aortic atherosclerosis Reproductive: Prostate is unremarkable. Other: No abdominal wall hernia or abnormality. Trace free fluid in the pelvis. Musculoskeletal: No acute or significant osseous findings. IMPRESSION: Large heterogeneous mass of the left upper quadrant with central cavitation centered at the body of the stomach and spleen and involving the splenic artery, splenic vein and tail of the pancreas. Favor gastric or splenic primary neoplasm. Enlarged periportal lymph nodes, concerning for nodal metastatic disease. Trace left pleural effusion and trace free fluid in the pelvis. Aortic Atherosclerosis (ICD10-I70.0). Electronically Signed   By: Yetta Glassman M.D.   On: 02/20/2021 16:21   NM PET Image Initial (PI) Skull Base To Thigh  Result Date: 03/09/2021 CLINICAL DATA:  Initial treatment strategy for gastric mass. EXAM: NUCLEAR MEDICINE PET SKULL BASE TO THIGH TECHNIQUE: 14.6 mCi F-18 FDG was injected intravenously. Full-ring PET imaging was performed from the skull base to thigh after the radiotracer. CT data was obtained and used for attenuation correction and anatomic localization.  Fasting blood glucose: 110 mg/dl COMPARISON:  February 20, 2021 CT of the abdomen and pelvis. FINDINGS: Mediastinal blood pool activity: SUV max 2.50 Liver activity: SUV max NA NECK: No hypermetabolic lymph nodes in the neck. Incidental CT findings: none CHEST: No hypermetabolic mediastinal or hilar nodes. No suspicious pulmonary nodules on the CT scan. Incidental CT findings: Minimal aortic atherosclerosis, signs of coronary artery disease and aortic calcification. Mild cardiac enlargement. Small LEFT-sided pleural effusion. No adenopathy by size criteria in the chest. Airways are patent. ABDOMEN/PELVIS: Large mass between stomach and spleen extending along gastrosplenic ligament and invading the splenic hilum and splenic parenchyma shows central necrosis and gas with communication with the gastric lumen. Mass is markedly hypermetabolic with a maximum SUV of 33.47. LEFT adrenal gland with thickening up to 13 mm in the axial plane and an area of increased metabolic activity (image 502/7) 4.65 maximum SUV. Periportal lymph nodes of concern on the previous CT without uptake beyond background liver. Largest celiac node without uptake above blood pool. No signs of adenopathy or solid organ metastases elsewhere Incidental CT findings: No pericholecystic stranding. Liver with smooth contours. Pancreatic tail is intimately associated with mass in the gastrosplenic ligament which also extends into the splenorenal ligament. RIGHT adrenal gland is normal. No hydronephrosis. LEFT kidney is separate from the mass which appears to extend along the LEFT anterior renal fascia and other planes as discussed. Mass in direct communication with the stomach. No acute gastrointestinal process with signs of sigmoid diverticulosis and diverticular disease. Aortic atherosclerosis without aneurysm. Prostate unremarkable by CT. SKELETON: No focal hypermetabolic activity to suggest skeletal metastasis. Incidental CT findings: none IMPRESSION:  Large ulcerated necrotic mass and direct communication with the gastric lumen, extending into splenic hilum and splenic parenchyma, also involving or abutting the distal pancreas, likely extending into the splenorenal ligament and abutting the under surface of the LEFT hemidiaphragm. This mass shows marked hypermetabolic features, LEFT adrenal nodularity and increased  metabolic activity is suspicious for LEFT adrenal metastasis with a maximum SUV that is greater than liver activity. No increased metabolic activity about periportal and celiac lymph nodes. Electronically Signed   By: Zetta Bills M.D.   On: 03/09/2021 11:56   DG Chest Port 1 View  Result Date: 03/09/2021 CLINICAL DATA:  Status post Port-A-Cath placement in a 60 year old male. EXAM: PORTABLE CHEST 1 VIEW COMPARISON:  May 19, 2018 and PET exam from September of 2022. FINDINGS: RIGHT-sided Port-A-Cath enters via IJ approach. This terminates in the mid to distal superior vena cava. Mild rotation to the RIGHT. Accounting for this cardiomediastinal contours and hilar structures are normal. Minimal LEFT basilar airspace disease and potential LEFT pleural effusion. No pneumothorax, RIGHT costodiaphragmatic sulcus is excluded from view. On limited assessment there is no acute skeletal process. IMPRESSION: Port-A-Cath tip in the mid to distal superior vena cava, central venous placement. No visible pneumothorax. RIGHT costodiaphragmatic sulcus is excluded from view on this semi erect radiograph. Correlate with symptoms to determine whether repeat radiograph may be warranted. Minimal LEFT basilar airspace disease and potential LEFT pleural effusion. Electronically Signed   By: Zetta Bills M.D.   On: 03/09/2021 11:31   DG Abd 2 Views  Result Date: 02/23/2021 CLINICAL DATA:  Abdominal pain.  Constipation. EXAM: ABDOMEN - 2 VIEW COMPARISON:  None. FINDINGS: No abnormal bowel dilatation is noted. Moderate amount of stool seen throughout the colon.  There is no evidence of free air. No radio-opaque calculi or other significant radiographic abnormality is seen. IMPRESSION: Moderate stool burden.  No abnormal bowel dilatation is noted. Electronically Signed   By: Marijo Conception M.D.   On: 02/23/2021 10:01   CT BONE MARROW BIOPSY & ASPIRATION  Result Date: 02/26/2021 CLINICAL DATA:  Lymphoma EXAM: CT GUIDED DEEP ILIAC BONE ASPIRATION AND CORE BIOPSY TECHNIQUE: Patient was placed prone on the CT gantry and limited axial scans through the pelvis were obtained. Appropriate skin entry site was identified. Skin site was marked, prepped with chlorhexidine, draped in usual sterile fashion, and infiltrated locally with 1% lidocaine. Intravenous Fentanyl 112mg and Versed 280mwere administered as conscious sedation during continuous monitoring of the patient's level of consciousness and physiological / cardiorespiratory status by the radiology RN, with a total moderate sedation time of 10 minutes. Under CT fluoroscopic guidance an 11-gauge Cook trocar bone needle was advanced into the left iliac bone just lateral to the sacroiliac joint. Once needle tip position was confirmed, core and aspiration samples were obtained, submitted to pathology for approval. Post procedure scans show no hematoma or fracture. Patient tolerated procedure well. COMPLICATIONS: COMPLICATIONS none IMPRESSION: 1. Technically successful CT guided left iliac bone core and aspiration biopsy. Electronically Signed   By: D Lucrezia Europe.D.   On: 02/26/2021 13:57   DG C-Arm 1-60 Min-No Report  Result Date: 03/09/2021 Fluoroscopy was utilized by the requesting physician.  No radiographic interpretation.   ECHOCARDIOGRAM COMPLETE  Result Date: 03/12/2021    ECHOCARDIOGRAM REPORT   Patient Name:   TOTHARUN CAPPELLAOCKE Date of Exam: 03/12/2021 Medical Rec #:  [id number]     Height:       76.0 in Accession #:    [id number]    Weight:       270.0 lb Date of Birth:  [birth year]    BSA:          2.517 m  Patient [age]:    [age]  BP:           144/90 mmHg Patient Gender: M               HR:           57 bpm. Exam Location:  [facility] Procedure: 2D Echo, Cardiac Doppler, Color Doppler and Strain Analysis Indications:     Chemo Z09  History:         Patient has no prior history of Echocardiogram examinations.                  Risk Factors:Hypertension and Diabetes.  Sonographer:     Sherrie Sport Referring Phys:  [id number] [place] Diagnosing Phys: Kathlyn Sacramento MD  Sonographer Comments: Suboptimal apical window. Global longitudinal strain was attempted. IMPRESSIONS  1. Left ventricular ejection fraction, by estimation, is 55 to 60%. The left ventricle has normal function. The left ventricle has no regional wall motion abnormalities. There is moderate left ventricular hypertrophy. Left ventricular diastolic parameters are consistent with Grade I diastolic dysfunction (impaired relaxation).  2. Right ventricular systolic function is normal. The right ventricular size is normal. There is normal pulmonary artery systolic pressure.  3. Left atrial size was mildly dilated.  4. Right atrial size was mildly dilated.  5. The mitral valve is normal in structure. No evidence of mitral valve regurgitation. No evidence of mitral stenosis.  6. The aortic valve is normal in structure. Aortic valve regurgitation is not visualized. Mild aortic valve sclerosis is present, with no evidence of aortic valve stenosis.  7. Aortic dilatation noted. There is mild dilatation of the aortic root, measuring 40 mm. FINDINGS  Left Ventricle: Left ventricular ejection fraction, by estimation, is 55 to 60%. The left ventricle has normal function. The left ventricle has no regional wall motion abnormalities. Global longitudinal strain performed but not reported based on interpreter judgement due to suboptimal tracking. The left ventricular internal cavity size was normal in size. There is moderate left ventricular hypertrophy. Left ventricular diastolic  parameters are consistent with Grade I diastolic dysfunction (impaired relaxation). Right Ventricle: The right ventricular size is normal. No increase in right ventricular wall thickness. Right ventricular systolic function is normal. There is normal pulmonary artery systolic pressure. The tricuspid regurgitant velocity is 2.73 m/s, and  with an assumed right atrial pressure of 5 mmHg, the estimated right ventricular systolic pressure is 93.2 mmHg. Left Atrium: Left atrial size was mildly dilated. Right Atrium: Right atrial size was mildly dilated. Pericardium: There is no evidence of pericardial effusion. Mitral Valve: The mitral valve is normal in structure. No evidence of mitral valve regurgitation. No evidence of mitral valve stenosis. Tricuspid Valve: The tricuspid valve is normal in structure. Tricuspid valve regurgitation is mild . No evidence of tricuspid stenosis. Aortic Valve: The aortic valve is normal in structure. Aortic valve regurgitation is not visualized. Mild aortic valve sclerosis is present, with no evidence of aortic valve stenosis. Aortic valve mean gradient measures 2.0 mmHg. Aortic valve peak gradient measures 4.5 mmHg. Aortic valve area, by VTI measures 5.20 cm. Pulmonic Valve: The pulmonic valve was normal in structure. Pulmonic valve regurgitation is not visualized. No evidence of pulmonic stenosis. Aorta: Aortic dilatation noted. There is mild dilatation of the aortic root, measuring 40 mm. Venous: The inferior vena cava was not well visualized. IAS/Shunts: No atrial level shunt detected by color flow Doppler.  LEFT VENTRICLE PLAX 2D LVIDd:         4.90 cm  Diastology LVIDs:  3.30 cm  LV e' medial:    4.46 cm/s LV PW:         1.90 cm  LV E/e' medial:  10.8 LV IVS:        1.60 cm  LV e' lateral:   7.29 cm/s LVOT diam:     2.30 cm  LV E/e' lateral: 6.6 LV SV:         79 LV SV Index:   32 LVOT Area:     4.15 cm  LEFT ATRIUM             Index       RIGHT ATRIUM           Index LA  diam:        3.80 cm 1.51 cm/m  RA Area:     22.90 cm LA Vol (A2C):   89.3 ml 35.48 ml/m RA Volume:   65.90 ml  26.19 ml/m LA Vol (A4C):   82.4 ml 32.74 ml/m LA Biplane Vol: 89.9 ml 35.72 ml/m  AORTIC VALVE                   PULMONIC VALVE AV Area (Vmax):    4.10 cm    PV Vmax:        0.51 m/s AV Area (Vmean):   4.30 cm    PV Peak grad:   1.0 mmHg AV Area (VTI):     5.20 cm    RVOT Peak grad: 4 mmHg AV Vmax:           106.50 cm/s AV Vmean:          69.350 cm/s AV VTI:            0.153 m AV Peak Grad:      4.5 mmHg AV Mean Grad:      2.0 mmHg LVOT Vmax:         105.00 cm/s LVOT Vmean:        71.700 cm/s LVOT VTI:          0.191 m LVOT/AV VTI ratio: 1.25  AORTA Ao Root diam: 3.93 cm MITRAL VALVE               TRICUSPID VALVE MV Area (PHT): 4.68 cm    TR Peak grad:   29.8 mmHg MV Decel Time: 162 msec    TR Vmax:        273.00 cm/s MV E velocity: 48.00 cm/s MV A velocity: 73.30 cm/s  SHUNTS MV E/A ratio:  0.65        Systemic VTI:  0.19 m                            Systemic Diam: 2.30 cm Kathlyn Sacramento MD Electronically signed by Kathlyn Sacramento MD Signature Date/Time: 03/12/2021/11:05:50 AM    Final      Assessment and plan  1. 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 Goals of care, counseling/discussion   3. Iron deficiency anemia due to chronic blood loss   4. Non-intractable vomiting with nausea, unspecified vomiting type   5. Hypercalcemia   6. AKI (acute kidney injury) ([place])   Cancer Staging DLBCL (diffuse large B cell lymphoma) ([place]) Staging form: Hodgkin and Non-Hodgkin Lymphoma, AJCC 8th Edition - Clinical stage from 03/13/2021: Stage IV (Diffuse large B-cell lymphoma) - Signed by Earlie Server, MD on 03/15/2021  #Gastric mass status biopsy,  splenic/adrenal involvement. MYC, BCL2, BCL6 FISH is negative.  Diffuse large B-cell lymphoma NOS Bone marrow biopsy showed no bone marrow involvement. 03/09/2021, PET scan showed large ulcerated necrotic mass in direct  communication with the gastric lumen, extending to splenic hilum, splenic parenchyma, also involving/and abutting the distal pancreas.  Likely extending into the splenorenal ligament and abutting the under surface of left hemi diagram.  Left adrenal nodularity and increased metabolic activity suspicious for adrenal metastasis. No increased metabolic activity about periportal and celiac lymph nodes.  The diagnosis and care plan were discussed with patient in detail.  I recommend 6 cycles of R- CHOP with IT- MTX We had discussed the composition of chemotherapy regimen, length of chemo cycle, duration of treatment and the time to assess response to treatment.  I explained to the patient the risks and benefits of chemotherapy including all but not limited to hair loss, mouth sore, nausea, vomiting, diarrhea, low blood counts, bleeding,heart failure,  neuropathy and risk of life threatening infection and even death, secondary malignancy etc.  . Patient voices understanding and willing to proceed chemotherapy.   # baseline echo showed normal LVEF. Grade 1 diastolic dysfunction. Will refer him to cardiology in the future.   # Chemotherapy education; s/p Medi- port placement. Antiemetics-Zofran and Compazine; EMLA cream sent to pharmacy. Recommend allopurinol 173m daily for TLS prophylaxis.   #Nausea and Vomiting, AKI IVF NS 1L 1 , IV Zofran 87mx 1, IV dexamethasone 1046m 1.   # IDA, hemoglobin is improving.  Proceed with IV venofer 200m58m1  # Hypercalcemia, probably due to AKI, dehydration.  Repeat BMP tomorrow. If calcium is persistently high  >13, will give Zometa [ dose per renal function].   #Left upper quadrant pain, neoplasm related.  Continue oxycodone every 4 hours as needed.  Orders Placed This Encounter  Procedures   DG FLUORO GUIDED LOC OF NEEDLE/CATH TIP FOR SPINAL INJECT LT    Need Flow Cytometry    Standing Status:   Future    Standing Expiration Date:   03/15/2022    Order  Specific Question:   Lab orders requested (DO NOT place separate lab orders, these will be automatically ordered during procedure specimen collection):    Answer:   CSF cell count w differential    Order Specific Question:   Lab orders requested (DO NOT place separate lab orders, these will be automatically ordered during procedure specimen collection):    Answer:   Protein and glucose, CSF    Order Specific Question:   Lab orders requested (DO NOT place separate lab orders, these will be automatically ordered during procedure specimen collection):    Answer:   Cytology - Non Pap    Order Specific Question:   Reason for Exam (SYMPTOM  OR DIAGNOSIS REQUIRED)    Answer:   Diffuse large B-cell lymphoma of lymph nodes of multiple regions    Order Specific Question:   Preferred Imaging Location?    Answer:   [facility]    Order Specific Question:   Radiology Contrast Protocol - do NOT remove file path    Answer:   [email or web address]   Basic metabolic panel    Standing Status:   Future    Standing Expiration Date:   03/15/2022   CBC with Differential    Standing Status:   Standing    Number of Occurrences:   20    Standing Expiration Date:   03/15/2022   Comprehensive metabolic panel  Standing Status:   Standing    Number of Occurrences:   20    Standing Expiration Date:   03/15/2022   PHYSICIAN COMMUNICATION ORDER    Hepatitis B Virus screening with HBsAg and anti-HBc recommended prior to treatment with rituximab, ofatumumab, or obinutuzumab.   PHYSICIAN COMMUNICATION ORDER    A baseline Echo/ Muga should be obtained prior to initiation of Anthracycline Chemotherapy    Follow-up 03/20/2021 lab MD cycle 1 [facility]   Earlie Server, MD, PhD Hematology Oncology [place] [facility]  03/15/2021

## 2021-03-15 NOTE — Progress Notes (Signed)
Pt states he has been dealing with severe nausea; has been throwing up non-stop since last Thursday; not able to eat much because he is not able to keep it down. Started with suppository last night seems to be working better than pills.

## 2021-03-16 ENCOUNTER — Inpatient Hospital Stay: Payer: BC Managed Care – PPO

## 2021-03-16 ENCOUNTER — Other Ambulatory Visit: Payer: Self-pay | Admitting: Oncology

## 2021-03-16 ENCOUNTER — Other Ambulatory Visit: Payer: Self-pay

## 2021-03-16 ENCOUNTER — Ambulatory Visit: Payer: BC Managed Care – PPO

## 2021-03-16 VITALS — BP 150/79 | HR 60 | Temp 98.0°F | Resp 19

## 2021-03-16 DIAGNOSIS — C8338 Diffuse large B-cell lymphoma, lymph nodes of multiple sites: Secondary | ICD-10-CM

## 2021-03-16 DIAGNOSIS — D500 Iron deficiency anemia secondary to blood loss (chronic): Secondary | ICD-10-CM

## 2021-03-16 LAB — COMPREHENSIVE METABOLIC PANEL
ALT: 60 U/L — ABNORMAL HIGH (ref 0–44)
AST: 43 U/L — ABNORMAL HIGH (ref 15–41)
Albumin: 3 g/dL — ABNORMAL LOW (ref 3.5–5.0)
Alkaline Phosphatase: 74 U/L (ref 38–126)
Anion gap: 9 (ref 5–15)
BUN: 34 mg/dL — ABNORMAL HIGH (ref 6–20)
CO2: 30 mmol/L (ref 22–32)
Calcium: 13.2 mg/dL (ref 8.9–10.3)
Chloride: 95 mmol/L — ABNORMAL LOW (ref 98–111)
Creatinine, Ser: 1.65 mg/dL — ABNORMAL HIGH (ref 0.61–1.24)
GFR, Estimated: 48 mL/min — ABNORMAL LOW (ref >60)
Glucose, Bld: 119 mg/dL — ABNORMAL HIGH (ref 70–99)
Potassium: 3.2 mmol/L — ABNORMAL LOW (ref 3.5–5.1)
Sodium: 134 mmol/L — ABNORMAL LOW (ref 135–145)
Total Bilirubin: 0.3 mg/dL (ref 0.3–1.2)
Total Protein: 7.1 g/dL (ref 6.5–8.1)

## 2021-03-16 LAB — CBC WITH DIFFERENTIAL/PLATELET
Abs Immature Granulocytes: 0.06 10*3/uL (ref 0.00–0.07)
Basophils Absolute: 0 10*3/uL (ref 0.0–0.1)
Basophils Relative: 0 %
Eosinophils Absolute: 0 10*3/uL (ref 0.0–0.5)
Eosinophils Relative: 0 %
HCT: 28.3 % — ABNORMAL LOW (ref 39.0–52.0)
Hemoglobin: 8.7 g/dL — ABNORMAL LOW (ref 13.0–17.0)
Immature Granulocytes: 1 %
Lymphocytes Relative: 13 %
Lymphs Abs: 1.2 10*3/uL (ref 0.7–4.0)
MCH: 23.1 pg — ABNORMAL LOW (ref 26.0–34.0)
MCHC: 30.7 g/dL (ref 30.0–36.0)
MCV: 75.3 fL — ABNORMAL LOW (ref 80.0–100.0)
Monocytes Absolute: 1.2 10*3/uL — ABNORMAL HIGH (ref 0.1–1.0)
Monocytes Relative: 13 %
Neutro Abs: 6.7 10*3/uL (ref 1.7–7.7)
Neutrophils Relative %: 73 %
Platelets: 243 10*3/uL (ref 150–400)
RBC: 3.76 MIL/uL — ABNORMAL LOW (ref 4.22–5.81)
RDW: 18 % — ABNORMAL HIGH (ref 11.5–15.5)
WBC: 9.2 10*3/uL (ref 4.0–10.5)
nRBC: 0 % (ref 0.0–0.2)

## 2021-03-16 MED ORDER — SODIUM CHLORIDE 0.9 % IV SOLN: 200.0000 mg | Freq: Once | INTRAVENOUS | Status: DC

## 2021-03-16 MED ORDER — SODIUM CHLORIDE 0.9 % IV SOLN: 10.0000 mg | Freq: Once | INTRAVENOUS | Status: DC

## 2021-03-16 MED ORDER — SODIUM CHLORIDE 0.9% FLUSH
10.0000 mL | Freq: Once | INTRAVENOUS | Status: AC
  Administered 2021-03-16: 10 mL via INTRAVENOUS
  Filled 2021-03-16: qty 10

## 2021-03-16 MED ORDER — SODIUM CHLORIDE 0.9 % IV SOLN: Freq: Once | INTRAVENOUS | Status: DC

## 2021-03-16 MED ORDER — IRON SUCROSE 20 MG/ML IV SOLN
200.0000 mg | Freq: Once | INTRAVENOUS | Status: AC
  Administered 2021-03-16: 200 mg via INTRAVENOUS
  Filled 2021-03-16: qty 10

## 2021-03-16 MED ORDER — POTASSIUM CHLORIDE 20 MEQ/100ML IV SOLN
20.0000 meq | Freq: Once | INTRAVENOUS | Status: AC
  Administered 2021-03-16: 20 meq via INTRAVENOUS

## 2021-03-16 MED ORDER — SODIUM CHLORIDE 0.9 % IV SOLN
Freq: Once | INTRAVENOUS | Status: DC
  Filled 2021-03-16: qty 250

## 2021-03-16 MED ORDER — HEPARIN SOD (PORK) LOCK FLUSH 100 UNIT/ML IV SOLN
INTRAVENOUS | Status: AC
  Administered 2021-03-16: 500 [IU]
  Filled 2021-03-16: qty 5

## 2021-03-16 MED ORDER — ONDANSETRON HCL 4 MG/2ML IJ SOLN
8.0000 mg | Freq: Once | INTRAMUSCULAR | Status: AC
  Administered 2021-03-16: 8 mg via INTRAVENOUS
  Filled 2021-03-16: qty 4

## 2021-03-16 MED ORDER — DEXAMETHASONE SODIUM PHOSPHATE 10 MG/ML IJ SOLN
10.0000 mg | Freq: Once | INTRAMUSCULAR | Status: AC
  Administered 2021-03-16: 10 mg via INTRAVENOUS
  Filled 2021-03-16: qty 1

## 2021-03-16 MED ORDER — ZOLEDRONIC ACID 4 MG/100ML IV SOLN
4.0000 mg | Freq: Once | INTRAVENOUS | Status: AC
  Administered 2021-03-16: 4 mg via INTRAVENOUS
  Filled 2021-03-16: qty 100

## 2021-03-16 MED ORDER — SODIUM CHLORIDE 0.9 % IV SOLN
Freq: Once | INTRAVENOUS | Status: AC
  Filled 2021-03-16: qty 250

## 2021-03-16 NOTE — Progress Notes (Signed)
Patient on schedule for LP Methotrexate injection on 03/21/2021, called and spoke with patient on phone with pre procedure instructions. Will hold asa after 10/1, and have driver post procedure/recovery/discharge. Does not take aspirin for heart diseas, so states he can hold.stated understanding.

## 2021-03-16 NOTE — Patient Instructions (Signed)

## 2021-03-19 ENCOUNTER — Other Ambulatory Visit: Payer: Self-pay

## 2021-03-19 ENCOUNTER — Inpatient Hospital Stay: Payer: BC Managed Care – PPO

## 2021-03-19 ENCOUNTER — Inpatient Hospital Stay: Payer: BC Managed Care – PPO | Attending: Oncology

## 2021-03-19 VITALS — BP 142/84 | HR 72 | Temp 98.2°F | Resp 20

## 2021-03-19 DIAGNOSIS — D6481 Anemia due to antineoplastic chemotherapy: Secondary | ICD-10-CM | POA: Diagnosis not present

## 2021-03-19 DIAGNOSIS — Z5189 Encounter for other specified aftercare: Secondary | ICD-10-CM | POA: Diagnosis not present

## 2021-03-19 DIAGNOSIS — C8338 Diffuse large B-cell lymphoma, lymph nodes of multiple sites: Secondary | ICD-10-CM | POA: Diagnosis present

## 2021-03-19 DIAGNOSIS — E119 Type 2 diabetes mellitus without complications: Secondary | ICD-10-CM | POA: Diagnosis not present

## 2021-03-19 DIAGNOSIS — D500 Iron deficiency anemia secondary to blood loss (chronic): Secondary | ICD-10-CM | POA: Diagnosis not present

## 2021-03-19 DIAGNOSIS — R109 Unspecified abdominal pain: Secondary | ICD-10-CM | POA: Diagnosis not present

## 2021-03-19 DIAGNOSIS — N289 Disorder of kidney and ureter, unspecified: Secondary | ICD-10-CM | POA: Insufficient documentation

## 2021-03-19 DIAGNOSIS — I10 Essential (primary) hypertension: Secondary | ICD-10-CM | POA: Insufficient documentation

## 2021-03-19 DIAGNOSIS — R112 Nausea with vomiting, unspecified: Secondary | ICD-10-CM | POA: Diagnosis not present

## 2021-03-19 DIAGNOSIS — N179 Acute kidney failure, unspecified: Secondary | ICD-10-CM | POA: Diagnosis not present

## 2021-03-19 DIAGNOSIS — Z801 Family history of malignant neoplasm of trachea, bronchus and lung: Secondary | ICD-10-CM | POA: Insufficient documentation

## 2021-03-19 DIAGNOSIS — Z87891 Personal history of nicotine dependence: Secondary | ICD-10-CM | POA: Insufficient documentation

## 2021-03-19 DIAGNOSIS — G893 Neoplasm related pain (acute) (chronic): Secondary | ICD-10-CM | POA: Insufficient documentation

## 2021-03-19 DIAGNOSIS — E871 Hypo-osmolality and hyponatremia: Secondary | ICD-10-CM | POA: Diagnosis not present

## 2021-03-19 DIAGNOSIS — D709 Neutropenia, unspecified: Secondary | ICD-10-CM | POA: Diagnosis not present

## 2021-03-19 DIAGNOSIS — Z5112 Encounter for antineoplastic immunotherapy: Secondary | ICD-10-CM | POA: Insufficient documentation

## 2021-03-19 LAB — CBC WITH DIFFERENTIAL/PLATELET
Abs Immature Granulocytes: 0.06 10*3/uL (ref 0.00–0.07)
Basophils Absolute: 0 10*3/uL (ref 0.0–0.1)
Basophils Relative: 0 %
Eosinophils Absolute: 0 10*3/uL (ref 0.0–0.5)
Eosinophils Relative: 0 %
HCT: 29 % — ABNORMAL LOW (ref 39.0–52.0)
Hemoglobin: 8.8 g/dL — ABNORMAL LOW (ref 13.0–17.0)
Immature Granulocytes: 1 %
Lymphocytes Relative: 8 %
Lymphs Abs: 0.9 10*3/uL (ref 0.7–4.0)
MCH: 22.7 pg — ABNORMAL LOW (ref 26.0–34.0)
MCHC: 30.3 g/dL (ref 30.0–36.0)
MCV: 74.7 fL — ABNORMAL LOW (ref 80.0–100.0)
Monocytes Absolute: 0.8 10*3/uL (ref 0.1–1.0)
Monocytes Relative: 7 %
Neutro Abs: 9.5 10*3/uL — ABNORMAL HIGH (ref 1.7–7.7)
Neutrophils Relative %: 84 %
Platelets: 275 10*3/uL (ref 150–400)
RBC: 3.88 MIL/uL — ABNORMAL LOW (ref 4.22–5.81)
RDW: 19 % — ABNORMAL HIGH (ref 11.5–15.5)
WBC: 11.3 10*3/uL — ABNORMAL HIGH (ref 4.0–10.5)
nRBC: 0 % (ref 0.0–0.2)

## 2021-03-19 LAB — COMPREHENSIVE METABOLIC PANEL
ALT: 62 U/L — ABNORMAL HIGH (ref 0–44)
AST: 42 U/L — ABNORMAL HIGH (ref 15–41)
Albumin: 2.7 g/dL — ABNORMAL LOW (ref 3.5–5.0)
Alkaline Phosphatase: 76 U/L (ref 38–126)
Anion gap: 9 (ref 5–15)
BUN: 34 mg/dL — ABNORMAL HIGH (ref 6–20)
CO2: 28 mmol/L (ref 22–32)
Calcium: 12.1 mg/dL — ABNORMAL HIGH (ref 8.9–10.3)
Chloride: 94 mmol/L — ABNORMAL LOW (ref 98–111)
Creatinine, Ser: 1.65 mg/dL — ABNORMAL HIGH (ref 0.61–1.24)
GFR, Estimated: 48 mL/min — ABNORMAL LOW (ref >60)
Glucose, Bld: 204 mg/dL — ABNORMAL HIGH (ref 70–99)
Potassium: 3.3 mmol/L — ABNORMAL LOW (ref 3.5–5.1)
Sodium: 131 mmol/L — ABNORMAL LOW (ref 135–145)
Total Bilirubin: 0.3 mg/dL (ref 0.3–1.2)
Total Protein: 6.8 g/dL (ref 6.5–8.1)

## 2021-03-19 MED ORDER — HEPARIN SOD (PORK) LOCK FLUSH 100 UNIT/ML IV SOLN
500.0000 [IU] | Freq: Once | INTRAVENOUS | Status: AC
  Administered 2021-03-19: 500 [IU] via INTRAVENOUS
  Filled 2021-03-19: qty 5

## 2021-03-19 MED ORDER — SODIUM CHLORIDE 0.9 % IV SOLN
Freq: Once | INTRAVENOUS | Status: AC
  Filled 2021-03-19: qty 250

## 2021-03-19 MED ORDER — SODIUM CHLORIDE 0.9% FLUSH
10.0000 mL | Freq: Once | INTRAVENOUS | Status: AC
  Administered 2021-03-19: 10 mL via INTRAVENOUS
  Filled 2021-03-19: qty 10

## 2021-03-19 MED ORDER — HEPARIN SOD (PORK) LOCK FLUSH 100 UNIT/ML IV SOLN
INTRAVENOUS | Status: AC
  Filled 2021-03-19: qty 5

## 2021-03-19 NOTE — Patient Instructions (Signed)

## 2021-03-20 ENCOUNTER — Encounter: Payer: Self-pay | Admitting: Oncology

## 2021-03-20 ENCOUNTER — Inpatient Hospital Stay: Payer: BC Managed Care – PPO

## 2021-03-20 ENCOUNTER — Inpatient Hospital Stay (HOSPITAL_BASED_OUTPATIENT_CLINIC_OR_DEPARTMENT_OTHER): Payer: BC Managed Care – PPO | Admitting: Oncology

## 2021-03-20 VITALS — BP 137/82 | HR 75 | Temp 97.4°F | Resp 17

## 2021-03-20 VITALS — BP 135/89 | HR 72 | Temp 97.8°F | Resp 19 | Wt 265.4 lb

## 2021-03-20 DIAGNOSIS — Z5111 Encounter for antineoplastic chemotherapy: Secondary | ICD-10-CM | POA: Insufficient documentation

## 2021-03-20 DIAGNOSIS — C8338 Diffuse large B-cell lymphoma, lymph nodes of multiple sites: Secondary | ICD-10-CM

## 2021-03-20 DIAGNOSIS — R112 Nausea with vomiting, unspecified: Secondary | ICD-10-CM

## 2021-03-20 DIAGNOSIS — N179 Acute kidney failure, unspecified: Secondary | ICD-10-CM | POA: Diagnosis not present

## 2021-03-20 DIAGNOSIS — D500 Iron deficiency anemia secondary to blood loss (chronic): Secondary | ICD-10-CM

## 2021-03-20 DIAGNOSIS — G893 Neoplasm related pain (acute) (chronic): Secondary | ICD-10-CM

## 2021-03-20 DIAGNOSIS — Z5112 Encounter for antineoplastic immunotherapy: Secondary | ICD-10-CM | POA: Diagnosis not present

## 2021-03-20 DIAGNOSIS — Z7189 Other specified counseling: Secondary | ICD-10-CM

## 2021-03-20 LAB — COMPREHENSIVE METABOLIC PANEL
ALT: 57 U/L — ABNORMAL HIGH (ref 0–44)
AST: 36 U/L (ref 15–41)
Albumin: 2.7 g/dL — ABNORMAL LOW (ref 3.5–5.0)
Alkaline Phosphatase: 82 U/L (ref 38–126)
Anion gap: 11 (ref 5–15)
BUN: 28 mg/dL — ABNORMAL HIGH (ref 6–20)
CO2: 28 mmol/L (ref 22–32)
Calcium: 11.6 mg/dL — ABNORMAL HIGH (ref 8.9–10.3)
Chloride: 95 mmol/L — ABNORMAL LOW (ref 98–111)
Creatinine, Ser: 1.72 mg/dL — ABNORMAL HIGH (ref 0.61–1.24)
GFR, Estimated: 45 mL/min — ABNORMAL LOW (ref >60)
Glucose, Bld: 139 mg/dL — ABNORMAL HIGH (ref 70–99)
Potassium: 3.5 mmol/L (ref 3.5–5.1)
Sodium: 134 mmol/L — ABNORMAL LOW (ref 135–145)
Total Bilirubin: 0.2 mg/dL — ABNORMAL LOW (ref 0.3–1.2)
Total Protein: 6.8 g/dL (ref 6.5–8.1)

## 2021-03-20 LAB — CBC WITH DIFFERENTIAL/PLATELET
Abs Immature Granulocytes: 0.08 10*3/uL — ABNORMAL HIGH (ref 0.00–0.07)
Basophils Absolute: 0 10*3/uL (ref 0.0–0.1)
Basophils Relative: 0 %
Eosinophils Absolute: 0.1 10*3/uL (ref 0.0–0.5)
Eosinophils Relative: 1 %
HCT: 27.9 % — ABNORMAL LOW (ref 39.0–52.0)
Hemoglobin: 8.6 g/dL — ABNORMAL LOW (ref 13.0–17.0)
Immature Granulocytes: 1 %
Lymphocytes Relative: 8 %
Lymphs Abs: 0.8 10*3/uL (ref 0.7–4.0)
MCH: 23.1 pg — ABNORMAL LOW (ref 26.0–34.0)
MCHC: 30.8 g/dL (ref 30.0–36.0)
MCV: 75 fL — ABNORMAL LOW (ref 80.0–100.0)
Monocytes Absolute: 0.8 10*3/uL (ref 0.1–1.0)
Monocytes Relative: 8 %
Neutro Abs: 8.6 10*3/uL — ABNORMAL HIGH (ref 1.7–7.7)
Neutrophils Relative %: 82 %
Platelets: 263 10*3/uL (ref 150–400)
RBC: 3.72 MIL/uL — ABNORMAL LOW (ref 4.22–5.81)
RDW: 18.8 % — ABNORMAL HIGH (ref 11.5–15.5)
WBC: 10.4 10*3/uL (ref 4.0–10.5)
nRBC: 0 % (ref 0.0–0.2)

## 2021-03-20 LAB — URIC ACID: Uric Acid, Serum: 7.5 mg/dL (ref 3.7–8.6)

## 2021-03-20 MED ORDER — DIPHENHYDRAMINE HCL 25 MG PO CAPS
50.0000 mg | ORAL_CAPSULE | Freq: Once | ORAL | Status: AC
  Administered 2021-03-20: 50 mg via ORAL
  Filled 2021-03-20: qty 2

## 2021-03-20 MED ORDER — SODIUM CHLORIDE 0.9% FLUSH
10.0000 mL | Freq: Once | INTRAVENOUS | Status: AC
  Administered 2021-03-20: 10 mL via INTRAVENOUS
  Filled 2021-03-20: qty 10

## 2021-03-20 MED ORDER — HEPARIN SOD (PORK) LOCK FLUSH 100 UNIT/ML IV SOLN
500.0000 [IU] | Freq: Once | INTRAVENOUS | Status: AC | PRN
  Filled 2021-03-20: qty 5

## 2021-03-20 MED ORDER — SODIUM CHLORIDE 0.9 % IV SOLN
Freq: Once | INTRAVENOUS | Status: AC
  Filled 2021-03-20: qty 250

## 2021-03-20 MED ORDER — SODIUM CHLORIDE 0.9 % IV SOLN
150.0000 mg | Freq: Once | INTRAVENOUS | Status: AC
  Administered 2021-03-20: 150 mg via INTRAVENOUS
  Filled 2021-03-20: qty 150

## 2021-03-20 MED ORDER — SODIUM CHLORIDE 0.9 % IV SOLN
750.0000 mg/m2 | Freq: Once | INTRAVENOUS | Status: AC
  Administered 2021-03-20: 1920 mg via INTRAVENOUS
  Filled 2021-03-20: qty 96

## 2021-03-20 MED ORDER — SODIUM CHLORIDE 0.9 % IV SOLN
12.0000 mg | Freq: Once | INTRAVENOUS | Status: AC
  Administered 2021-03-20: 12 mg via INTRAVENOUS
  Filled 2021-03-20: qty 1.2

## 2021-03-20 MED ORDER — PALONOSETRON HCL INJECTION 0.25 MG/5ML
0.2500 mg | Freq: Once | INTRAVENOUS | Status: AC
  Administered 2021-03-20: 0.25 mg via INTRAVENOUS
  Filled 2021-03-20: qty 5

## 2021-03-20 MED ORDER — RITUXIMAB-PVVR CHEMO 500 MG/50ML IV SOLN
375.0000 mg/m2 | Freq: Once | INTRAVENOUS | Status: AC
  Administered 2021-03-20: 1000 mg via INTRAVENOUS
  Filled 2021-03-20: qty 100

## 2021-03-20 MED ORDER — HEPARIN SOD (PORK) LOCK FLUSH 100 UNIT/ML IV SOLN
INTRAVENOUS | Status: AC
  Administered 2021-03-20: 500 [IU]
  Filled 2021-03-20: qty 5

## 2021-03-20 MED ORDER — ACETAMINOPHEN 325 MG PO TABS
650.0000 mg | ORAL_TABLET | Freq: Once | ORAL | Status: AC
  Administered 2021-03-20: 650 mg via ORAL
  Filled 2021-03-20: qty 2

## 2021-03-20 MED ORDER — DOXORUBICIN HCL CHEMO IV INJECTION 2 MG/ML
50.0000 mg/m2 | Freq: Once | INTRAVENOUS | Status: AC
  Administered 2021-03-20: 128 mg via INTRAVENOUS
  Filled 2021-03-20: qty 64

## 2021-03-20 MED ORDER — FENTANYL 25 MCG/HR TD PT72: 1.0000 | MEDICATED_PATCH | TRANSDERMAL | 0 refills | Status: DC

## 2021-03-20 MED ORDER — VINCRISTINE SULFATE CHEMO INJECTION 1 MG/ML
2.0000 mg | Freq: Once | INTRAVENOUS | Status: AC
  Administered 2021-03-20: 2 mg via INTRAVENOUS
  Filled 2021-03-20: qty 2

## 2021-03-20 NOTE — Patient Instructions (Signed)
[facility]  Discharge Instructions: Thank you for choosing [facility] to provide your oncology and hematology care.  If you have a lab appointment with the [facility], please go directly to the [facility] and check in at the registration area.  Wear comfortable clothing and clothing appropriate for easy access to any Portacath or PICC line.   We strive to give you quality time with your provider. You may need to reschedule your appointment if you arrive late (15 or more minutes).  Arriving late affects you and other patients whose appointments are after yours.  Also, if you miss three or more appointments without notifying the office, you may be dismissed from the clinic at the provider's discretion.      For prescription refill requests, have your pharmacy contact our office and allow 72 hours for refills to be completed.    Today you received the following chemotherapy and/or immunotherapy agents: Rituxan, Vincristine, Cytoxan, Adriamycin.  Rituximab Injection What is this medication? RITUXIMAB (ri TUX i mab) is a monoclonal antibody. It is used to treat certain types of cancer like non-Hodgkin lymphoma and chronic lymphocytic leukemia. It is also used to treat rheumatoid arthritis, granulomatosis with polyangiitis, microscopic polyangiitis, and pemphigus vulgaris. This medicine may be used for other purposes; ask your health care provider or pharmacist if you have questions. COMMON BRAND NAME(S): RIABNI, Rituxan, RUXIENCE What should I tell my care team before I take this medication? They need to know if you have any of these conditions: chest pain heart disease infection especially a viral infection such as chickenpox, cold sores, hepatitis B, or herpes immune system problems irregular heartbeat or rhythm kidney disease low blood counts (white cells, platelets, or red cells) lung disease recent or upcoming vaccine an unusual  or allergic reaction to rituximab, other medicines, foods, dyes, or preservatives pregnant or trying to get pregnant breast-feeding How should I use this medication? This medicine is injected into a vein. It is given by a health care provider in a hospital or clinic setting. A special MedGuide will be given to you before each treatment. Be sure to read this information carefully each time. Talk to your health care provider about the use of this medicine in children. While this drug may be prescribed for children as young as 6 months for selected conditions, precautions do apply. Overdosage: If you think you have taken too much of this medicine contact a poison control center or emergency room at once. NOTE: This medicine is only for you. Do not share this medicine with others. What if I miss a dose? Keep appointments for follow-up doses. It is important not to miss your dose. Call your health care provider if you are unable to keep an appointment. What may interact with this medication? Do not take this medicine with any of the following medicines: live vaccines This medicine may also interact with the following medicines: cisplatin This list may not describe all possible interactions. Give your health care provider a list of all the medicines, herbs, non-prescription drugs, or dietary supplements you use. Also tell them if you smoke, drink alcohol, or use illegal drugs. Some items may interact with your medicine. What should I watch for while using this medication? Your condition will be monitored carefully while you are receiving this medicine. You may need blood work done while you are taking this medicine. This medicine can cause serious infusion reactions. To reduce the risk your health care provider may  give you other medicines to take before receiving this one. Be sure to follow the directions from your health care provider. This medicine may increase your risk of getting an infection.  Call your health care provider for advice if you get a fever, chills, sore throat, or other symptoms of a cold or flu. Do not treat yourself. Try to avoid being around people who are sick. Call your health care provider if you are around anyone with measles, chickenpox, or if you develop sores or blisters that do not heal properly. Avoid taking medicines that contain aspirin, acetaminophen, ibuprofen, naproxen, or ketoprofen unless instructed by your health care provider. These medicines may hide a fever. This medicine may cause serious skin reactions. They can happen weeks to months after starting the medicine. Contact your health care provider right away if you notice fevers or flu-like symptoms with a rash. The rash may be red or purple and then turn into blisters or peeling of the skin. Or, you might notice a red rash with swelling of the face, lips or lymph nodes in your neck or under your arms. In some patients, this medicine may cause a serious brain infection that may cause death. If you have any problems seeing, thinking, speaking, walking, or standing, tell your healthcare professional right away. If you cannot reach your healthcare professional, urgently seek other source of medical care. Do not become pregnant while taking this medicine or for at least 12 months after stopping it. Women should inform their health care provider if they wish to become pregnant or think they might be pregnant. There is potential for serious harm to an unborn child. Talk to your health care provider for more information. Women should use a reliable form of birth control while taking this medicine and for 12 months after stopping it. Do not breast-feed while taking this medicine or for at least 6 months after stopping it. What side effects may I notice from receiving this medication? Side effects that you should report to your health care provider as soon as possible: allergic reactions (skin rash, itching or hives;  swelling of the face, lips, or tongue) diarrhea edema (sudden weight gain; swelling of the ankles, feet, hands or other unusual swelling; trouble breathing) fast, irregular heartbeat heart attack (trouble breathing; pain or tightness in the chest, neck, back or arms; unusually weak or tired) infection (fever, chills, cough, sore throat, pain or trouble passing urine) kidney injury (trouble passing urine or change in the amount of urine) liver injury (dark yellow or brown urine; general ill feeling or flu-like symptoms; loss of appetite, right upper belly pain; unusually weak or tired, yellowing of the eyes or skin) low blood pressure (dizziness; feeling faint or lightheaded, falls; unusually weak or tired) low red blood cell counts (trouble breathing; feeling faint; lightheaded, falls; unusually weak or tired) mouth sores redness, blistering, peeling, or loosening of the skin, including inside the mouth stomach pain unusual bruising or bleeding wheezing (trouble breathing with loud or whistling sounds) vomiting Side effects that usually do not require medical attention (report to your health care provider if they continue or are bothersome): headache joint pain muscle cramps, pain nausea This list may not describe all possible side effects. Call your doctor for medical advice about side effects. You may report side effects to FDA at 1-800-FDA-1088. Where should I keep my medication? This medicine is given in a hospital or clinic. It will not be stored at home. NOTE: This sheet is a summary. It  may not cover all possible information. If you have questions about this medicine, talk to your doctor, pharmacist, or health care provider.  2022 Elsevier/Gold Standard (2020-05-25 15:47:26) Doxorubicin injection What is this medication? DOXORUBICIN (dox oh ROO bi sin) is a chemotherapy drug. It is used to treat many kinds of cancer like leukemia, lymphoma, neuroblastoma, sarcoma, and Wilms'  tumor. It is also used to treat bladder cancer, breast cancer, lung cancer, ovarian cancer, stomach cancer, and thyroid cancer. This medicine may be used for other purposes; ask your health care provider or pharmacist if you have questions. COMMON BRAND NAME(S): Adriamycin, Adriamycin PFS, Adriamycin RDF, Rubex What should I tell my care team before I take this medication? They need to know if you have any of these conditions: heart disease history of low blood counts caused by a medicine liver disease recent or ongoing radiation therapy an unusual or allergic reaction to doxorubicin, other chemotherapy agents, other medicines, foods, dyes, or preservatives pregnant or trying to get pregnant breast-feeding How should I use this medication? This drug is given as an infusion into a vein. It is administered in a hospital or clinic by a specially trained health care professional. If you have pain, swelling, burning or any unusual feeling around the site of your injection, tell your health care professional right away. Talk to your pediatrician regarding the use of this medicine in children. Special care may be needed. Overdosage: If you think you have taken too much of this medicine contact a poison control center or emergency room at once. NOTE: This medicine is only for you. Do not share this medicine with others. What if I miss a dose? It is important not to miss your dose. Call your doctor or health care professional if you are unable to keep an appointment. What may interact with this medication? This medicine may interact with the following medications: 6-mercaptopurine paclitaxel phenytoin St. John's Wort trastuzumab verapamil This list may not describe all possible interactions. Give your health care provider a list of all the medicines, herbs, non-prescription drugs, or dietary supplements you use. Also tell them if you smoke, drink alcohol, or use illegal drugs. Some items may  interact with your medicine. What should I watch for while using this medication? This drug may make you feel generally unwell. This is not uncommon, as chemotherapy can affect healthy cells as well as cancer cells. Report any side effects. Continue your course of treatment even though you feel ill unless your doctor tells you to stop. There is a maximum amount of this medicine you should receive throughout your life. The amount depends on the medical condition being treated and your overall health. Your doctor will watch how much of this medicine you receive in your lifetime. Tell your doctor if you have taken this medicine before. You may need blood work done while you are taking this medicine. Your urine may turn red for a few days after your dose. This is not blood. If your urine is dark or brown, call your doctor. In some cases, you may be given additional medicines to help with side effects. Follow all directions for their use. Call your doctor or health care professional for advice if you get a fever, chills or sore throat, or other symptoms of a cold or flu. Do not treat yourself. This drug decreases your body's ability to fight infections. Try to avoid being around people who are sick. This medicine may increase your risk to bruise or bleed. Call your  doctor or health care professional if you notice any unusual bleeding. Talk to your doctor about your risk of cancer. You may be more at risk for certain types of cancers if you take this medicine. Do not become pregnant while taking this medicine or for 6 months after stopping it. Women should inform their doctor if they wish to become pregnant or think they might be pregnant. Men should not father a child while taking this medicine and for 6 months after stopping it. There is a potential for serious side effects to an unborn child. Talk to your health care professional or pharmacist for more information. Do not breast-feed an infant while taking  this medicine. This medicine has caused ovarian failure in some women and reduced sperm counts in some men This medicine may interfere with the ability to have a child. Talk with your doctor or health care professional if you are concerned about your fertility. This medicine may cause a decrease in Co-Enzyme Q-10. You should make sure that you get enough Co-Enzyme Q-10 while you are taking this medicine. Discuss the foods you eat and the vitamins you take with your health care professional. What side effects may I notice from receiving this medication? Side effects that you should report to your doctor or health care professional as soon as possible: allergic reactions like skin rash, itching or hives, swelling of the face, lips, or tongue breathing problems chest pain fast or irregular heartbeat low blood counts - this medicine may decrease the number of white blood cells, red blood cells and platelets. You may be at increased risk for infections and bleeding. pain, redness, or irritation at site where injected signs of infection - fever or chills, cough, sore throat, pain or difficulty passing urine signs of decreased platelets or bleeding - bruising, pinpoint red spots on the skin, black, tarry stools, blood in the urine swelling of the ankles, feet, hands tiredness weakness Side effects that usually do not require medical attention (report to your doctor or health care professional if they continue or are bothersome): diarrhea hair loss mouth sores nail discoloration or damage nausea red colored urine vomiting This list may not describe all possible side effects. Call your doctor for medical advice about side effects. You may report side effects to FDA at 1-800-FDA-1088. Where should I keep my medication? This drug is given in a hospital or clinic and will not be stored at home. NOTE: This sheet is a summary. It may not cover all possible information. If you have questions about this  medicine, talk to your doctor, pharmacist, or health care provider.  2022 Elsevier/Gold Standard (2017-01-15 11:01:26)  Cyclophosphamide Injection What is this medication? CYCLOPHOSPHAMIDE (sye kloe FOSS fa mide) is a chemotherapy drug. It slows the growth of cancer cells. This medicine is used to treat many types of cancer like lymphoma, myeloma, leukemia, breast cancer, and ovarian cancer, to name a few. This medicine may be used for other purposes; ask your health care provider or pharmacist if you have questions. COMMON BRAND NAME(S): Cytoxan, Neosar What should I tell my care team before I take this medication? They need to know if you have any of these conditions: heart disease history of irregular heartbeat infection kidney disease liver disease low blood counts, like white cells, platelets, or red blood cells on hemodialysis recent or ongoing radiation therapy scarring or thickening of the lungs trouble passing urine an unusual or allergic reaction to cyclophosphamide, other medicines, foods, dyes, or preservatives pregnant or  trying to get pregnant breast-feeding How should I use this medication? This drug is usually given as an injection into a vein or muscle or by infusion into a vein. It is administered in a hospital or clinic by a specially trained health care professional. Talk to your pediatrician regarding the use of this medicine in children. Special care may be needed. Overdosage: If you think you have taken too much of this medicine contact a poison control center or emergency room at once. NOTE: This medicine is only for you. Do not share this medicine with others. What if I miss a dose? It is important not to miss your dose. Call your doctor or health care professional if you are unable to keep an appointment. What may interact with this medication? amphotericin B azathioprine certain antivirals for HIV or hepatitis certain medicines for blood pressure, heart  disease, irregular heart beat certain medicines that treat or prevent blood clots like warfarin certain other medicines for cancer cyclosporine etanercept indomethacin medicines that relax muscles for surgery medicines to increase blood counts metronidazole This list may not describe all possible interactions. Give your health care provider a list of all the medicines, herbs, non-prescription drugs, or dietary supplements you use. Also tell them if you smoke, drink alcohol, or use illegal drugs. Some items may interact with your medicine. What should I watch for while using this medication? Your condition will be monitored carefully while you are receiving this medicine. You may need blood work done while you are taking this medicine. Drink water or other fluids as directed. Urinate often, even at night. Some products may contain alcohol. Ask your health care professional if this medicine contains alcohol. Be sure to tell all health care professionals you are taking this medicine. Certain medicines, like metronidazole and disulfiram, can cause an unpleasant reaction when taken with alcohol. The reaction includes flushing, headache, nausea, vomiting, sweating, and increased thirst. The reaction can last from 30 minutes to several hours. Do not become pregnant while taking this medicine or for 1 year after stopping it. Women should inform their health care professional if they wish to become pregnant or think they might be pregnant. Men should not father a child while taking this medicine and for 4 months after stopping it. There is potential for serious side effects to an unborn child. Talk to your health care professional for more information. Do not breast-feed an infant while taking this medicine or for 1 week after stopping it. This medicine has caused ovarian failure in some women. This medicine may make it more difficult to get pregnant. Talk to your health care professional if you are  concerned about your fertility. This medicine has caused decreased sperm counts in some men. This may make it more difficult to father a child. Talk to your health care professional if you are concerned about your fertility. Call your health care professional for advice if you get a fever, chills, or sore throat, or other symptoms of a cold or flu. Do not treat yourself. This medicine decreases your body's ability to fight infections. Try to avoid being around people who are sick. Avoid taking medicines that contain aspirin, acetaminophen, ibuprofen, naproxen, or ketoprofen unless instructed by your health care professional. These medicines may hide a fever. Talk to your health care professional about your risk of cancer. You may be more at risk for certain types of cancer if you take this medicine. If you are going to need surgery or other procedure, tell your  health care professional that you are using this medicine. Be careful brushing or flossing your teeth or using a toothpick because you may get an infection or bleed more easily. If you have any dental work done, tell your dentist you are receiving this medicine. What side effects may I notice from receiving this medication? Side effects that you should report to your doctor or health care professional as soon as possible: allergic reactions like skin rash, itching or hives, swelling of the face, lips, or tongue breathing problems nausea, vomiting signs and symptoms of bleeding such as bloody or black, tarry stools; red or dark brown urine; spitting up blood or brown material that looks like coffee grounds; red spots on the skin; unusual bruising or bleeding from the eyes, gums, or nose signs and symptoms of heart failure like fast, irregular heartbeat, sudden weight gain; swelling of the ankles, feet, hands signs and symptoms of infection like fever; chills; cough; sore throat; pain or trouble passing urine signs and symptoms of kidney injury  like trouble passing urine or change in the amount of urine signs and symptoms of liver injury like dark yellow or brown urine; general ill feeling or flu-like symptoms; light-colored stools; loss of appetite; nausea; right upper belly pain; unusually weak or tired; yellowing of the eyes or skin Side effects that usually do not require medical attention (report to your doctor or health care professional if they continue or are bothersome): confusion decreased hearing diarrhea facial flushing hair loss headache loss of appetite missed menstrual periods signs and symptoms of low red blood cells or anemia such as unusually weak or tired; feeling faint or lightheaded; falls skin discoloration This list may not describe all possible side effects. Call your doctor for medical advice about side effects. You may report side effects to FDA at 1-800-FDA-1088. Where should I keep my medication? This drug is given in a hospital or clinic and will not be stored at home. NOTE: This sheet is a summary. It may not cover all possible information. If you have questions about this medicine, talk to your doctor, pharmacist, or health care provider.  2022 Elsevier/Gold Standard (2019-03-08 09:53:29) Vincristine injection What is this medication? VINCRISTINE (vin KRIS teen) is a chemotherapy drug. It slows the growth of cancer cells. This medicine is used to treat many types of cancer like Hodgkin's disease, leukemia, non-Hodgkin's lymphoma, neuroblastoma (brain cancer), rhabdomyosarcoma, and Wilms' tumor. This medicine may be used for other purposes; ask your health care provider or pharmacist if you have questions. COMMON BRAND NAME(S): Oncovin, Vincasar PFS What should I tell my care team before I take this medication? They need to know if you have any of these conditions: blood disorders gout infection (especially chickenpox, cold sores, or herpes) kidney disease liver disease lung disease nervous  system disease like Charcot-Marie-Tooth (CMT) recent or ongoing radiation therapy an unusual or allergic reaction to vincristine, other chemotherapy agents, other medicines, foods, dyes, or preservatives pregnant or trying to get pregnant breast-feeding How should I use this medication? This drug is given as an infusion into a vein. It is administered in a hospital or clinic by a specially trained health care professional. If you have pain, swelling, burning, or any unusual feeling around the site of your injection, tell your health care professional right away. Talk to your pediatrician regarding the use of this medicine in children. While this drug may be prescribed for selected conditions, precautions do apply. Overdosage: If you think you have taken too much  of this medicine contact a poison control center or emergency room at once. NOTE: This medicine is only for you. Do not share this medicine with others. What if I miss a dose? It is important not to miss your dose. Call your doctor or health care professional if you are unable to keep an appointment. What may interact with this medication? certain medicines for fungal infections like itraconazole, ketoconazole, posaconazole, voriconazole certain medicines for seizures like phenytoin This list may not describe all possible interactions. Give your health care provider a list of all the medicines, herbs, non-prescription drugs, or dietary supplements you use. Also tell them if you smoke, drink alcohol, or use illegal drugs. Some items may interact with your medicine. What should I watch for while using this medication? This drug may make you feel generally unwell. This is not uncommon, as chemotherapy can affect healthy cells as well as cancer cells. Report any side effects. Continue your course of treatment even though you feel ill unless your doctor tells you to stop. You may need blood work done while you are taking this medicine. This  medicine will cause constipation. Try to have a bowel movement at least every 2 to 3 days. If you do not have a bowel movement for 3 days, call your doctor or health care professional. In some cases, you may be given additional medicines to help with side effects. Follow all directions for their use. Do not become pregnant while taking this medicine. Women should inform their doctor if they wish to become pregnant or think they might be pregnant. There is a potential for serious side effects to an unborn child. Talk to your health care professional or pharmacist for more information. Do not breast-feed an infant while taking this medicine. This medicine may make it more difficult to get pregnant or to father a child. Talk to your healthcare professional if you are concerned about your fertility. What side effects may I notice from receiving this medication? Side effects that you should report to your doctor or health care professional as soon as possible: allergic reactions like skin rash, itching or hives, swelling of the face, lips, or tongue breathing problems confusion or changes in emotions or moods constipation cough mouth sores muscle weakness nausea and vomiting pain, swelling, redness or irritation at the injection site pain, tingling, numbness in the hands or feet problems with balance, talking, walking seizures stomach pain trouble passing urine or change in the amount of urine Side effects that usually do not require medical attention (report to your doctor or health care professional if they continue or are bothersome): diarrhea hair loss jaw pain loss of appetite This list may not describe all possible side effects. Call your doctor for medical advice about side effects. You may report side effects to FDA at 1-800-FDA-1088. Where should I keep my medication? This drug is given in a hospital or clinic and will not be stored at home. NOTE: This sheet is a summary. It may not  cover all possible information. If you have questions about this medicine, talk to your doctor, pharmacist, or health care provider.  2022 [employer]vier/Gold Standard (2019-05-04 17:05:13)          To help prevent nausea and vomiting after your treatment, we encourage you to take your nausea medication as directed.  BELOW ARE SYMPTOMS THAT SHOULD BE REPORTED IMMEDIATELY: *FEVER GREATER THAN 100.4 F (38 C) OR HIGHER *CHILLS OR SWEATING *NAUSEA AND VOMITING THAT IS NOT CONTROLLED WITH YOUR NAUSEA  MEDICATION *UNUSUAL SHORTNESS OF BREATH *UNUSUAL BRUISING OR BLEEDING *URINARY PROBLEMS (pain or burning when urinating, or frequent urination) *BOWEL PROBLEMS (unusual diarrhea, constipation, pain near the anus) TENDERNESS IN MOUTH AND THROAT WITH OR WITHOUT PRESENCE OF ULCERS (sore throat, sores in mouth, or a toothache) UNUSUAL RASH, SWELLING OR PAIN  UNUSUAL VAGINAL DISCHARGE OR ITCHING   Items with * indicate a potential emergency and should be followed up as soon as possible or go to the Emergency Department if any problems should occur.  Please show the CHEMOTHERAPY ALERT CARD or IMMUNOTHERAPY ALERT CARD at check-in to the Emergency Department and triage nurse.  Should you have questions after your visit or need to cancel or reschedule your appointment, please contact [facility]  [phone] and follow the prompts.  Office hours are 8:00 a.m. to 4:30 p.m. Monday - Friday. Please note that voicemails left after 4:00 p.m. may not be returned until the following business day.  We are closed weekends and major holidays. You have access to a nurse at all times for urgent questions. Please call the main number to the clinic [phone] and follow the prompts.  For any non-urgent questions, you may also contact your provider using MyChart. We now offer e-Visits for anyone 17 and older to request care online for non-urgent symptoms. For details visit  [email or web address].   Also download the MyChart app! Go to the app store, search "MyChart", open the app, select , and log in with your MyChart username and password.  Due to Covid, a mask is required upon entering the hospital/clinic. If you do not have a mask, one will be given to you upon arrival. For doctor visits, patients may have 1 support person aged 54 or older with them. For treatment visits, patients cannot have anyone with them due to current Covid guidelines and our immunocompromised population.

## 2021-03-20 NOTE — Progress Notes (Signed)
Mr. Bridgewater tolerated his first time R-CHOP treatment today without any complications. Vital signs remained stable.

## 2021-03-20 NOTE — Progress Notes (Signed)
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  Past medical history of DM, HTN.  Patient receiving R-CHOP  Met with patient during infusion today.  Patient reports poor po intake and issues with nausea for about 2 months now.  Patient reports ice cold water and colder foods have been working better for him.  (Juices, slushy). Ate honey bun yesterday.      Medications: colace, MVI, zofran, miralax, protonix, compazine, phenergan supposatory   Labs: Na 131, K 3.3, glucose 204, BUN 34, creatinine 1.65   Anthropometrics:   Height: 76 inches Weight: 265 lb 6.4 oz today 286 lb on 9/12 BMI: 32  7% weight loss in the last 3 weeks, significant  Estimated Energy Needs  Kcals: 2975-3500 Protein: 138-184 g (IBW 1.5-2 g/kg) Fluid: 2.9 L   NUTRITION DIAGNOSIS: Inadequate oral intake related to cancer and cancer related treatment side effects as evidenced by  7% weight loss in the last 3 weeks and poor po intake and nausea    INTERVENTION:  Discussed strategies to help with nausea and vomiting. Handout provided Samples of boost soothe, boost breeze, ensure clear, ensure complete and carnation breakfast essentials given to patient to try.  Discussed how to purchase products as well.  Contact information given.   MONITORING, EVALUATION, GOAL: weight trends, intake   Next Visit: Tuesday, Oct 11 during fluids  Maiyah Goyne B. Zenia Resides, [place], [place] Registered Dietitian [phone] (mobile)

## 2021-03-20 NOTE — Progress Notes (Signed)
Hematology/Oncology Follow Up Note [facility]  Telephone:([phone] [phone]  Patient Care Team: Sharyne Peach, MD as PCP - General (Family Medicine) Earlie Server, MD as Consulting Physician (Hematology and Oncology)   Name of the patient: Zachary Perkins  [id number]  22-Sep-1960   REASON FOR VISIT Follow-up for DLBCL  PERTINENT ONCOLOGY HISTORY -02/21/2021 EGD showed large fungating ulcerated noncircumferential mass with no bleeding and no stigmata of recent bleeding was found on the greater curvature of the stomach.  Biopsy was taken.  Large amount of necrotic and ulcerated appearing tissue.  Acute duodenitis.  Gastritis Awaiting for pathology report.  PET scan has been scheduled for outpatient. 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 Patient has also received PRBC transfusions as well as IV Venofer treatments daily x3 during hospitalization.  Today he reports feeling tired and fatigued.  Appetite is fair. Constipation, last bowel movement was yesterday.  Small amount.  He takes Senokot 1 tablet twice daily. Left flank pain, intermittent.  He takes pain medication.  # 02/26/2021 Bone marrow biopsy showed no bone marrow involvement. 03/09/2021, PET scan showed large ulcerated necrotic mass in direct communication with the gastric lumen, extending to splenic hilum, splenic parenchyma, also involving/and abutting the distal pancreas.  Likely extending into the splenorenal ligament and abutting the under surface of left hemi diagram.  Left adrenal nodularity and increased metabolic activity suspicious for adrenal metastasis. No increased metabolic activity about periportal and celiac lymph nodes.  INTERVAL HISTORY Zachary Perkins is a 60 y.o. male who has above history reviewed by me today presents for follow up visit for DLBCL, treatment.  Appetite was better  last week after Dexamethasone, then got worse again.  Fatigue. No fever, chills.  Decreased oral intake.  Nausea, no vomiting. He feels oxycodone makes nausea worse.     Review of Systems  Constitutional:  Positive for appetite change, fatigue and unexpected weight change. Negative for chills and fever.  HENT:   Negative for hearing loss and voice change.   Eyes:  Negative for eye problems and icterus.  Respiratory:  Negative for chest tightness, cough and shortness of breath.   Cardiovascular:  Negative for chest pain and leg swelling.  Gastrointestinal:  Positive for abdominal pain, constipation and nausea. Negative for abdominal distention and vomiting.  Endocrine: Negative for hot flashes.  Genitourinary:  Negative for difficulty urinating, dysuria and frequency.   Musculoskeletal:  Negative for arthralgias.  Skin:  Negative for itching and rash.  Neurological:  Negative for light-headedness and numbness.  Hematological:  Negative for adenopathy. Does not bruise/bleed easily.  Psychiatric/Behavioral:  Negative for confusion.      Allergies  Allergen Reactions   Lisinopril Cough   Losartan Diarrhea   Metformin And Related     PASSED OUT     Past Medical History:  Diagnosis Date   Diabetes mellitus without complication ([place])    Diffuse large B cell lymphoma ([place])    Hypertension    Hypothyroidism    Morbid obesity ([place])      Past Surgical History:  Procedure Laterality Date   CARPAL TUNNEL RELEASE Left    ESOPHAGOGASTRODUODENOSCOPY (EGD) WITH PROPOFOL N/A 02/21/2021   Procedure: ESOPHAGOGASTRODUODENOSCOPY (EGD) WITH PROPOFOL;  Surgeon: Annamaria Helling, DO;  Location: [facility] END[facility];  Service: Gastroenterology;  Laterality: N/A;  after 2pm   INSERTION OF MESH N/A 05/02/2017   Procedure: INSERTION OF MESH;  Surgeon: Leonie Green, MD;  Location: [facility] ORS;  Service: General;  Laterality: N/A;   lasix eye surgery     PORTACATH PLACEMENT Right 03/09/2021    Procedure: INSERTION PORT-A-CATH;  Surgeon: Jules Husbands, MD;  Location: [facility] ORS;  Service: General;  Laterality: Right;   UMBILICAL HERNIA REPAIR N/A 05/02/2017   Procedure: HERNIA REPAIR UMBILICAL ADULT;  Surgeon: Leonie Green, MD;  Location: [facility] ORS;  Service: General;  Laterality: N/A;    Social History   Socioeconomic History   Marital status: Married    Spouse name: Melonie   Number of children: 0   Years of education: 12   Highest education level: 12th grade  Occupational History   Not on file  Tobacco Use   Smoking status: Former    Packs/day: 1.00    Years: 20.00    Pack years: 20.00    Types: Cigarettes    Quit date: 04/25/2007    Years since quitting: 13.9   Smokeless tobacco: Never  Vaping Use   Vaping Use: Never used  Substance and Sexual Activity   Alcohol use: Not Currently    Comment: rare   Drug use: No   Sexual activity: Yes  Other Topics Concern   Not on file  Social History Narrative   Not on file   Social Determinants of Health   Financial Resource Strain: Not on file  Food Insecurity: Not on file  Transportation Needs: Not on file  Physical Activity: Not on file  Stress: Not on file  Social Connections: Not on file  Intimate Partner Violence: Not on file    Family History  Problem Relation Age of Onset   COPD Father    Emphysema Father    Cancer - Lung Brother      Current Outpatient Medications:    allopurinol (ZYLOPRIM) 100 MG tablet, Take 1 tablet (100 mg total) by mouth daily., Disp: 30 tablet, Rfl: 0   bisacodyl (DULCOLAX) 10 MG suppository, Place 1 suppository (10 mg total) rectally daily as needed for severe constipation. (Patient not taking: Reported on 03/15/2021), Disp: 12 suppository, Rfl: 0   docusate sodium (COLACE) 100 MG capsule, Take 200 mg by mouth daily., Disp: , Rfl:    hydrochlorothiazide (HYDRODIURIL) 25 MG tablet, Take 25 mg by mouth daily., Disp: , Rfl:    levothyroxine (SYNTHROID) 300 MCG tablet, Take  300 mcg by mouth daily., Disp: , Rfl:    lidocaine-prilocaine (EMLA) cream, Apply to affected area once, Disp: 30 g, Rfl: 3   Multiple Vitamin (MULTI-VITAMIN) tablet, Take 1 tablet by mouth daily., Disp: , Rfl:    ondansetron (ZOFRAN) 8 MG tablet, Take 1 tablet (8 mg total) by mouth every 8 (eight) hours as needed for nausea or vomiting. (Patient not taking: Reported on 03/15/2021), Disp: 90 tablet, Rfl: 1   oxyCODONE (OXY IR/ROXICODONE) 5 MG immediate release tablet, Take 1 tablet (5 mg total) by mouth every 4 (four) hours as needed for moderate pain., Disp: 60 tablet, Rfl: 0   pantoprazole (PROTONIX) 40 MG tablet, Take 1 tablet (40 mg total) by mouth 2 (two) times daily before a meal., Disp: 60 tablet, Rfl: 0   polyethylene glycol (MIRALAX / GLYCOLAX) 17 g packet, Take 17 g by mouth daily as needed for moderate constipation. (Patient taking differently: Take 17 g by mouth daily.), Disp: 14 each, Rfl: 0   potassium chloride (KLOR-CON) 10 MEQ tablet, Take 10 mEq by mouth daily., Disp: , Rfl:    predniSONE (  DELTASONE) 20 MG tablet, Take 5 tablets (100 mg total) by mouth daily. Take with food on days 2-5 of chemotherapy., Disp: 25 tablet, Rfl: 5   prochlorperazine (COMPAZINE) 10 MG tablet, Take 1 tablet (10 mg total) by mouth every 6 (six) hours as needed for nausea or vomiting. (Patient not taking: Reported on 03/15/2021), Disp: 90 tablet, Rfl: 1   promethazine (PHENERGAN) 25 MG suppository, Place 1 suppository (25 mg total) rectally every 6 (six) hours as needed for nausea or vomiting., Disp: 24 each, Rfl: 0  Physical exam:  There were no vitals filed for this visit.  Physical Exam Constitutional:      General: He is not in acute distress. HENT:     Head: Normocephalic and atraumatic.  Eyes:     General: No scleral icterus. Cardiovascular:     Rate and Rhythm: Normal rate and regular rhythm.     Heart sounds: Normal heart sounds.  Pulmonary:     Effort: Pulmonary effort is normal. No  respiratory distress.     Breath sounds: No wheezing.  Abdominal:     General: Bowel sounds are normal. There is no distension.     Palpations: Abdomen is soft.  Musculoskeletal:        General: No deformity. Normal range of motion.     Cervical back: Normal range of motion and neck supple.  Skin:    General: Skin is warm and dry.     Coloration: Skin is pale.     Findings: No erythema or rash.  Neurological:     Mental Status: He is alert and oriented to person, place, and time. Mental status is at baseline.     Cranial Nerves: No cranial nerve deficit.     Coordination: Coordination normal.  Psychiatric:        Mood and Affect: Mood normal.    CMP Latest Ref Rng & Units 03/20/2021  Glucose 70 - 99 mg/dL 139(H)  BUN 6 - 20 mg/dL 28(H)  Creatinine 0.61 - 1.24 mg/dL 1.72(H)  Sodium 135 - 145 mmol/L 134(L)  Potassium 3.5 - 5.1 mmol/L 3.5  Chloride 98 - 111 mmol/L 95(L)  CO2 22 - 32 mmol/L 28  Calcium 8.9 - 10.3 mg/dL 11.6(H)  Total Protein 6.5 - 8.1 g/dL 6.8  Total Bilirubin 0.3 - 1.2 mg/dL 0.2(L)  Alkaline Phos 38 - 126 U/L 82  AST 15 - 41 U/L 36  ALT 0 - 44 U/L 57(H)   CBC Latest Ref Rng & Units 03/20/2021  WBC 4.0 - 10.5 K/uL 10.4  Hemoglobin 13.0 - 17.0 g/dL 8.6(L)  Hematocrit 39.0 - 52.0 % 27.9(L)  Platelets 150 - 400 K/uL 263    RADIOGRAPHIC STUDIES: I have personally reviewed the radiological images as listed and agreed with the findings in the report. CT ABDOMEN PELVIS W CONTRAST  Result Date: 02/20/2021 CLINICAL DATA:  Bowel obstruction suspected EXAM: CT ABDOMEN AND PELVIS WITH CONTRAST TECHNIQUE: Multidetector CT imaging of the abdomen and pelvis was performed using the standard protocol following bolus administration of intravenous contrast. CONTRAST:  112m OMNIPAQUE IOHEXOL 350 MG/ML SOLN COMPARISON:  None. FINDINGS: Lower chest: Trace left pleural effusion. Hepatobiliary: No focal liver abnormality is seen. No gallstones, gallbladder wall thickening, or biliary  dilatation. Pancreas: Pancreatic atrophy. Tail of the pancreas is located adjacent to a large left upper quadrant mass which is described below with no intervening fat plane. Spleen/Stomach: Large heterogeneous mass of the left upper quadrant centered at the body of the stomach and  spleen and involving the splenic artery and vein and tail of the pancreas. Mass measures approximately 12.0 x 11.4 cm in axial dimension. Air seen within the mass, likely due to fistulization with the stomach and/or necrosis. Small and large bowel: Normal in caliber with no evidence of wall thickening or obstruction. Normal appendix. Numerous colonic diverticula, most pronounced in the sigmoid colon. Adrenals/Urinary Tract: Adrenal glands are unremarkable. Kidneys are normal, without renal calculi, focal lesion, or hydronephrosis. Bladder is unremarkable. Vascular/Lymphatic: Enlarged periportal lymph nodes. Reference periportal lymph node measuring 1.4 cm in short axis located on series 2, image 23. Aortic atherosclerosis Reproductive: Prostate is unremarkable. Other: No abdominal wall hernia or abnormality. Trace free fluid in the pelvis. Musculoskeletal: No acute or significant osseous findings. IMPRESSION: Large heterogeneous mass of the left upper quadrant with central cavitation centered at the body of the stomach and spleen and involving the splenic artery, splenic vein and tail of the pancreas. Favor gastric or splenic primary neoplasm. Enlarged periportal lymph nodes, concerning for nodal metastatic disease. Trace left pleural effusion and trace free fluid in the pelvis. Aortic Atherosclerosis (ICD10-I70.0). Electronically Signed   By: Yetta Glassman M.D.   On: 02/20/2021 16:21   NM PET Image Initial (PI) Skull Base To Thigh  Result Date: 03/09/2021 CLINICAL DATA:  Initial treatment strategy for gastric mass. EXAM: NUCLEAR MEDICINE PET SKULL BASE TO THIGH TECHNIQUE: 14.6 mCi F-18 FDG was injected intravenously. Full-ring PET  imaging was performed from the skull base to thigh after the radiotracer. CT data was obtained and used for attenuation correction and anatomic localization. Fasting blood glucose: 110 mg/dl COMPARISON:  February 20, 2021 CT of the abdomen and pelvis. FINDINGS: Mediastinal blood pool activity: SUV max 2.50 Liver activity: SUV max NA NECK: No hypermetabolic lymph nodes in the neck. Incidental CT findings: none CHEST: No hypermetabolic mediastinal or hilar nodes. No suspicious pulmonary nodules on the CT scan. Incidental CT findings: Minimal aortic atherosclerosis, signs of coronary artery disease and aortic calcification. Mild cardiac enlargement. Small LEFT-sided pleural effusion. No adenopathy by size criteria in the chest. Airways are patent. ABDOMEN/PELVIS: Large mass between stomach and spleen extending along gastrosplenic ligament and invading the splenic hilum and splenic parenchyma shows central necrosis and gas with communication with the gastric lumen. Mass is markedly hypermetabolic with a maximum SUV of 33.47. LEFT adrenal gland with thickening up to 13 mm in the axial plane and an area of increased metabolic activity (image 009/3) 4.65 maximum SUV. Periportal lymph nodes of concern on the previous CT without uptake beyond background liver. Largest celiac node without uptake above blood pool. No signs of adenopathy or solid organ metastases elsewhere Incidental CT findings: No pericholecystic stranding. Liver with smooth contours. Pancreatic tail is intimately associated with mass in the gastrosplenic ligament which also extends into the splenorenal ligament. RIGHT adrenal gland is normal. No hydronephrosis. LEFT kidney is separate from the mass which appears to extend along the LEFT anterior renal fascia and other planes as discussed. Mass in direct communication with the stomach. No acute gastrointestinal process with signs of sigmoid diverticulosis and diverticular disease. Aortic atherosclerosis  without aneurysm. Prostate unremarkable by CT. SKELETON: No focal hypermetabolic activity to suggest skeletal metastasis. Incidental CT findings: none IMPRESSION: Large ulcerated necrotic mass and direct communication with the gastric lumen, extending into splenic hilum and splenic parenchyma, also involving or abutting the distal pancreas, likely extending into the splenorenal ligament and abutting the under surface of the LEFT hemidiaphragm. This mass shows marked hypermetabolic  features, LEFT adrenal nodularity and increased metabolic activity is suspicious for LEFT adrenal metastasis with a maximum SUV that is greater than liver activity. No increased metabolic activity about periportal and celiac lymph nodes. Electronically Signed   By: Zetta Bills M.D.   On: 03/09/2021 11:56   DG Chest Port 1 View  Result Date: 03/09/2021 CLINICAL DATA:  Status post Port-A-Cath placement in a 60 year old male. EXAM: PORTABLE CHEST 1 VIEW COMPARISON:  May 19, 2018 and PET exam from September of 2022. FINDINGS: RIGHT-sided Port-A-Cath enters via IJ approach. This terminates in the mid to distal superior vena cava. Mild rotation to the RIGHT. Accounting for this cardiomediastinal contours and hilar structures are normal. Minimal LEFT basilar airspace disease and potential LEFT pleural effusion. No pneumothorax, RIGHT costodiaphragmatic sulcus is excluded from view. On limited assessment there is no acute skeletal process. IMPRESSION: Port-A-Cath tip in the mid to distal superior vena cava, central venous placement. No visible pneumothorax. RIGHT costodiaphragmatic sulcus is excluded from view on this semi erect radiograph. Correlate with symptoms to determine whether repeat radiograph may be warranted. Minimal LEFT basilar airspace disease and potential LEFT pleural effusion. Electronically Signed   By: Zetta Bills M.D.   On: 03/09/2021 11:31   DG Abd 2 Views  Result Date: 02/23/2021 CLINICAL DATA:  Abdominal  pain.  Constipation. EXAM: ABDOMEN - 2 VIEW COMPARISON:  None. FINDINGS: No abnormal bowel dilatation is noted. Moderate amount of stool seen throughout the colon. There is no evidence of free air. No radio-opaque calculi or other significant radiographic abnormality is seen. IMPRESSION: Moderate stool burden.  No abnormal bowel dilatation is noted. Electronically Signed   By: Marijo Conception M.D.   On: 02/23/2021 10:01   CT BONE MARROW BIOPSY & ASPIRATION  Result Date: 02/26/2021 CLINICAL DATA:  Lymphoma EXAM: CT GUIDED DEEP ILIAC BONE ASPIRATION AND CORE BIOPSY TECHNIQUE: Patient was placed prone on the CT gantry and limited axial scans through the pelvis were obtained. Appropriate skin entry site was identified. Skin site was marked, prepped with chlorhexidine, draped in usual sterile fashion, and infiltrated locally with 1% lidocaine. Intravenous Fentanyl 125mg and Versed 28mwere administered as conscious sedation during continuous monitoring of the patient's level of consciousness and physiological / cardiorespiratory status by the radiology RN, with a total moderate sedation time of 10 minutes. Under CT fluoroscopic guidance an 11-gauge Cook trocar bone needle was advanced into the left iliac bone just lateral to the sacroiliac joint. Once needle tip position was confirmed, core and aspiration samples were obtained, submitted to pathology for approval. Post procedure scans show no hematoma or fracture. Patient tolerated procedure well. COMPLICATIONS: COMPLICATIONS none IMPRESSION: 1. Technically successful CT guided left iliac bone core and aspiration biopsy. Electronically Signed   By: D Lucrezia Europe.D.   On: 02/26/2021 13:57   DG C-Arm 1-60 Min-No Report  Result Date: 03/09/2021 Fluoroscopy was utilized by the requesting physician.  No radiographic interpretation.   ECHOCARDIOGRAM COMPLETE  Result Date: 03/12/2021    ECHOCARDIOGRAM REPORT   Patient Name:   TOASENCION LOVEDAYOCKE Date of Exam: 03/12/2021  Medical Rec #:  [id number]     Height:       76.0 in Accession #:    [id number]    Weight:       270.0 lb Date of Birth:  [id number]    BSA:          2.517 m Patient Age:    [age]  years        BP:           144/90 mmHg Patient Gender: M               HR:           57 bpm. Exam Location:  [facility] Procedure: 2D Echo, Cardiac Doppler, Color Doppler and Strain Analysis Indications:     Chemo Z09  History:         Patient has no prior history of Echocardiogram examinations.                  Risk Factors:Hypertension and Diabetes.  Sonographer:     Sherrie Sport Referring Phys:  [id number] [place] Diagnosing Phys: Kathlyn Sacramento MD  Sonographer Comments: Suboptimal apical window. Global longitudinal strain was attempted. IMPRESSIONS  1. Left ventricular ejection fraction, by estimation, is 55 to 60%. The left ventricle has normal function. The left ventricle has no regional wall motion abnormalities. There is moderate left ventricular hypertrophy. Left ventricular diastolic parameters are consistent with Grade I diastolic dysfunction (impaired relaxation).  2. Right ventricular systolic function is normal. The right ventricular size is normal. There is normal pulmonary artery systolic pressure.  3. Left atrial size was mildly dilated.  4. Right atrial size was mildly dilated.  5. The mitral valve is normal in structure. No evidence of mitral valve regurgitation. No evidence of mitral stenosis.  6. The aortic valve is normal in structure. Aortic valve regurgitation is not visualized. Mild aortic valve sclerosis is present, with no evidence of aortic valve stenosis.  7. Aortic dilatation noted. There is mild dilatation of the aortic root, measuring 40 mm. FINDINGS  Left Ventricle: Left ventricular ejection fraction, by estimation, is 55 to 60%. The left ventricle has normal function. The left ventricle has no regional wall motion abnormalities. Global longitudinal strain performed but not reported based on interpreter judgement  due to suboptimal tracking. The left ventricular internal cavity size was normal in size. There is moderate left ventricular hypertrophy. Left ventricular diastolic parameters are consistent with Grade I diastolic dysfunction (impaired relaxation). Right Ventricle: The right ventricular size is normal. No increase in right ventricular wall thickness. Right ventricular systolic function is normal. There is normal pulmonary artery systolic pressure. The tricuspid regurgitant velocity is 2.73 m/s, and  with an assumed right atrial pressure of 5 mmHg, the estimated right ventricular systolic pressure is 82.5 mmHg. Left Atrium: Left atrial size was mildly dilated. Right Atrium: Right atrial size was mildly dilated. Pericardium: There is no evidence of pericardial effusion. Mitral Valve: The mitral valve is normal in structure. No evidence of mitral valve regurgitation. No evidence of mitral valve stenosis. Tricuspid Valve: The tricuspid valve is normal in structure. Tricuspid valve regurgitation is mild . No evidence of tricuspid stenosis. Aortic Valve: The aortic valve is normal in structure. Aortic valve regurgitation is not visualized. Mild aortic valve sclerosis is present, with no evidence of aortic valve stenosis. Aortic valve mean gradient measures 2.0 mmHg. Aortic valve peak gradient measures 4.5 mmHg. Aortic valve area, by VTI measures 5.20 cm. Pulmonic Valve: The pulmonic valve was normal in structure. Pulmonic valve regurgitation is not visualized. No evidence of pulmonic stenosis. Aorta: Aortic dilatation noted. There is mild dilatation of the aortic root, measuring 40 mm. Venous: The inferior vena cava was not well visualized. IAS/Shunts: No atrial level shunt detected by color flow Doppler.  LEFT VENTRICLE PLAX 2D LVIDd:  4.90 cm  Diastology LVIDs:         3.30 cm  LV e' medial:    4.46 cm/s LV PW:         1.90 cm  LV E/e' medial:  10.8 LV IVS:        1.60 cm  LV e' lateral:   7.29 cm/s LVOT diam:      2.30 cm  LV E/e' lateral: 6.6 LV SV:         79 LV SV Index:   32 LVOT Area:     4.15 cm  LEFT ATRIUM             Index       RIGHT ATRIUM           Index LA diam:        3.80 cm 1.51 cm/m  RA Area:     22.90 cm LA Vol (A2C):   89.3 ml 35.48 ml/m RA Volume:   65.90 ml  26.19 ml/m LA Vol (A4C):   82.4 ml 32.74 ml/m LA Biplane Vol: 89.9 ml 35.72 ml/m  AORTIC VALVE                   PULMONIC VALVE AV Area (Vmax):    4.10 cm    PV Vmax:        0.51 m/s AV Area (Vmean):   4.30 cm    PV Peak grad:   1.0 mmHg AV Area (VTI):     5.20 cm    RVOT Peak grad: 4 mmHg AV Vmax:           106.50 cm/s AV Vmean:          69.350 cm/s AV VTI:            0.153 m AV Peak Grad:      4.5 mmHg AV Mean Grad:      2.0 mmHg LVOT Vmax:         105.00 cm/s LVOT Vmean:        71.700 cm/s LVOT VTI:          0.191 m LVOT/AV VTI ratio: 1.25  AORTA Ao Root diam: 3.93 cm MITRAL VALVE               TRICUSPID VALVE MV Area (PHT): 4.68 cm    TR Peak grad:   29.8 mmHg MV Decel Time: 162 msec    TR Vmax:        273.00 cm/s MV E velocity: 48.00 cm/s MV A velocity: 73.30 cm/s  SHUNTS MV E/A ratio:  0.65        Systemic VTI:  0.19 m                            Systemic Diam: 2.30 cm Kathlyn Sacramento MD Electronically signed by Kathlyn Sacramento MD Signature Date/Time: 03/12/2021/11:05:50 AM    Final      Assessment and plan  1. 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 Iron deficiency anemia due to chronic blood loss   3. Encounter for antineoplastic chemotherapy   4. Hypercalcemia   5. AKI (acute kidney injury) ([place])   6. Goals of care, counseling/discussion   7. Neoplasm related pain   8. Nausea and vomiting, unspecified vomiting type   Cancer Staging DLBCL (diffuse large B cell lymphoma) ([facility]) Staging form: Hodgkin and Non-Hodgkin Lymphoma, AJCC 8th  Edition - Clinical stage from 03/13/2021: Stage IV (Diffuse large B-cell lymphoma) - Signed by Earlie Server, MD on 03/15/2021  #Gastric mass status biopsy,  splenic/adrenal involvement. MYC, BCL2, BCL6 FISH is negative.  Diffuse large B-cell lymphoma NOS I recommend 6 cycles of R- CHOP with Day 2 IT- MTX - chemo side effects were discussed. Patient knows to take Prednisone on D2-5. He gets Dexamethasone on D1.  Continue Allopurinol 1668m daily.   Labs are reviewed and discussed with patient. Proceed with Cycle 1 chemo, D2 IT-MTX, D3 Udenyca  claritin daily for 4 days.   # Nausea and vomiting, continue Zofran and Compazine PRN.  Hold Phenergan suppository due to risk of neutropenia risk.   # Abdominal pain, add fentanyl patch 228m/h Q72h. Oxycodone 68m61m4 hours as needed.    # baseline echo showed normal LVEF. Grade 1 diastolic dysfunction. Will refer him to cardiology in the future.   #renal impairment, Creatinine has not returned to his baseline.  Day 3 IVF NS 1L x1 Day 4 IVF + Venofer.   # IDA, hemoglobin is improving.  Proceed with IV venofer [id number] on 10/7  # Hypercalcemia, probably due to AKI, dehydration.  S/p Zometa x 1. Calcium has decreased.,    Orders Placed This Encounter  Procedures   DG FLUORO GUIDED LOC OF NEEDLE/CATH TIP FOR SPINAL INJECT LT    Standing Status:   Future    Standing Expiration Date:   03/20/2022    Order Specific Question:   Lab orders requested (DO NOT place separate lab orders, these will be automatically ordered during procedure specimen collection):    Answer:   None    Order Specific Question:   Reason for Exam (SYMPTOM  OR DIAGNOSIS REQUIRED)    Answer:   Diffuse large B-cell lymphoma of lymph nodes of multiple regions    Order Specific Question:   Preferred Imaging Location?    Answer:   [facility]    Order Specific Question:   Radiology Contrast Protocol - do NOT remove file path    Answer:   [email or web address]   Uric acid    Standing Status:   Future    Number of Occurrences:   1    Standing Expiration Date:   03/20/2022   CBC  with Differential/Platelet    Standing Status:   Future    Standing Expiration Date:   03/20/2022   Comprehensive metabolic panel    Standing Status:   Future    Standing Expiration Date:   03/20/2022   Sample to Blood Bank    Standing Status:   Future    Standing Expiration Date:   03/20/2022    Follow-up  03/27/2021 lab MD IVF.   ZhouEarlie Server, PhD  03/20/2021

## 2021-03-21 ENCOUNTER — Telehealth: Payer: Self-pay

## 2021-03-21 ENCOUNTER — Other Ambulatory Visit: Payer: Self-pay

## 2021-03-21 ENCOUNTER — Ambulatory Visit
Admission: RE | Admit: 2021-03-21 | Discharge: 2021-03-21 | Disposition: A | Discharge status: Home / Self Care | Payer: BC Managed Care – PPO | Source: Ambulatory Visit | Attending: Oncology | Admitting: Oncology

## 2021-03-21 ENCOUNTER — Inpatient Hospital Stay: Payer: BC Managed Care – PPO

## 2021-03-21 VITALS — BP 145/91 | HR 63 | Temp 97.3°F | Resp 15 | Ht 76.0 in | Wt 265.0 lb

## 2021-03-21 DIAGNOSIS — C8338 Diffuse large B-cell lymphoma, lymph nodes of multiple sites: Secondary | ICD-10-CM | POA: Diagnosis present

## 2021-03-21 LAB — CSF CELL COUNT WITH DIFFERENTIAL
Eosinophils, CSF: 0 %
Lymphs, CSF: 27 %
Monocyte-Macrophage-Spinal Fluid: 70 %
RBC Count, CSF: 24 /mm3 — ABNORMAL HIGH (ref 0–3)
Segmented Neutrophils-CSF: 3 %
Tube #: 3
WBC, CSF: 5 /mm3 (ref 0–5)

## 2021-03-21 LAB — GLUCOSE, CSF: Glucose, CSF: 91 mg/dL — ABNORMAL HIGH (ref 40–70)

## 2021-03-21 LAB — PROTEIN, CSF: Total  Protein, CSF: 38 mg/dL (ref 15–45)

## 2021-03-21 MED ORDER — LIDOCAINE HCL (PF) 1 % IJ SOLN
5.0000 mL | Freq: Once | INTRAMUSCULAR | Status: AC
  Administered 2021-03-21: 5 mL
  Filled 2021-03-21: qty 5

## 2021-03-21 MED ORDER — SODIUM CHLORIDE (PF) 0.9 % IJ SOLN
Freq: Once | INTRAMUSCULAR | Status: AC
  Filled 2021-03-21: qty 0.48

## 2021-03-21 NOTE — Procedures (Signed)
PROCEDURE SUMMARY:  Successful fluoroscopic guided diagnostic and therapeutic lumbar puncture.  Yielded 9 mL of colorless CSF fluid.  Successful injection of Methotrexate.  No immediate complications.  Pt tolerated well.   Specimen was sent for labs.  EBL N/A  Rockney Ghee 03/21/2021 12:27 PM

## 2021-03-21 NOTE — Telephone Encounter (Signed)
Telephone call to patient for follow up after receiving first infusion.   Patient states infusion went great.  States eating good and drinking plenty of fluids.   Denies any nausea or vomiting.  Encouraged patient to call for any concerns or questions. 

## 2021-03-22 ENCOUNTER — Inpatient Hospital Stay: Payer: BC Managed Care – PPO

## 2021-03-22 VITALS — BP 159/94 | HR 74 | Temp 97.0°F

## 2021-03-22 DIAGNOSIS — Z5112 Encounter for antineoplastic immunotherapy: Secondary | ICD-10-CM | POA: Diagnosis not present

## 2021-03-22 DIAGNOSIS — C8338 Diffuse large B-cell lymphoma, lymph nodes of multiple sites: Secondary | ICD-10-CM

## 2021-03-22 DIAGNOSIS — D500 Iron deficiency anemia secondary to blood loss (chronic): Secondary | ICD-10-CM

## 2021-03-22 LAB — CYTOLOGY - NON PAP

## 2021-03-22 MED ORDER — SODIUM CHLORIDE 0.9 % IV SOLN
Freq: Once | INTRAVENOUS | Status: AC
  Filled 2021-03-22: qty 250

## 2021-03-22 MED ORDER — PEGFILGRASTIM-CBQV 6 MG/0.6ML ~~LOC~~ SOSY
6.0000 mg | PREFILLED_SYRINGE | Freq: Once | SUBCUTANEOUS | Status: AC
  Administered 2021-03-22: 6 mg via SUBCUTANEOUS
  Filled 2021-03-22: qty 0.6

## 2021-03-22 MED ORDER — HEPARIN SOD (PORK) LOCK FLUSH 100 UNIT/ML IV SOLN
INTRAVENOUS | Status: AC
  Filled 2021-03-22: qty 5

## 2021-03-23 ENCOUNTER — Other Ambulatory Visit: Payer: Self-pay

## 2021-03-23 ENCOUNTER — Inpatient Hospital Stay: Payer: BC Managed Care – PPO

## 2021-03-23 VITALS — BP 137/85 | HR 66 | Temp 98.2°F | Resp 20

## 2021-03-23 DIAGNOSIS — D500 Iron deficiency anemia secondary to blood loss (chronic): Secondary | ICD-10-CM

## 2021-03-23 DIAGNOSIS — Z5112 Encounter for antineoplastic immunotherapy: Secondary | ICD-10-CM | POA: Diagnosis not present

## 2021-03-23 MED ORDER — HEPARIN SOD (PORK) LOCK FLUSH 100 UNIT/ML IV SOLN
INTRAVENOUS | Status: AC
  Filled 2021-03-23: qty 5

## 2021-03-23 MED ORDER — HEPARIN SOD (PORK) LOCK FLUSH 100 UNIT/ML IV SOLN
500.0000 [IU] | Freq: Once | INTRAVENOUS | Status: AC | PRN
  Administered 2021-03-23: 500 [IU]
  Filled 2021-03-23: qty 5

## 2021-03-23 MED ORDER — SODIUM CHLORIDE 0.9 % IV SOLN
Freq: Once | INTRAVENOUS | Status: AC
  Filled 2021-03-23: qty 250

## 2021-03-23 MED ORDER — ALTEPLASE 2 MG IJ SOLR
2.0000 mg | Freq: Once | INTRAMUSCULAR | Status: DC | PRN
  Filled 2021-03-23: qty 2

## 2021-03-23 MED ORDER — SODIUM CHLORIDE 0.9 % IV SOLN: 200.0000 mg | Freq: Once | INTRAVENOUS | Status: DC

## 2021-03-23 MED ORDER — IRON SUCROSE 20 MG/ML IV SOLN
200.0000 mg | Freq: Once | INTRAVENOUS | Status: AC
  Administered 2021-03-23: 200 mg via INTRAVENOUS
  Filled 2021-03-23: qty 10

## 2021-03-27 ENCOUNTER — Encounter: Payer: Self-pay | Admitting: Emergency Medicine

## 2021-03-27 ENCOUNTER — Other Ambulatory Visit: Payer: Self-pay

## 2021-03-27 ENCOUNTER — Inpatient Hospital Stay: Payer: BC Managed Care – PPO

## 2021-03-27 ENCOUNTER — Inpatient Hospital Stay (HOSPITAL_BASED_OUTPATIENT_CLINIC_OR_DEPARTMENT_OTHER): Payer: BC Managed Care – PPO | Admitting: Oncology

## 2021-03-27 ENCOUNTER — Emergency Department
Admission: EM | Admit: 2021-03-27 | Discharge: 2021-03-27 | Disposition: A | Discharge status: Home / Self Care | Payer: BC Managed Care – PPO | Attending: Student in an Organized Health Care Education/Training Program | Admitting: Student in an Organized Health Care Education/Training Program

## 2021-03-27 ENCOUNTER — Emergency Department: Payer: BC Managed Care – PPO

## 2021-03-27 ENCOUNTER — Encounter: Payer: Self-pay | Admitting: Oncology

## 2021-03-27 VITALS — BP 122/85 | HR 77 | Temp 99.1°F | Resp 18

## 2021-03-27 DIAGNOSIS — D701 Agranulocytosis secondary to cancer chemotherapy: Secondary | ICD-10-CM | POA: Insufficient documentation

## 2021-03-27 DIAGNOSIS — Z87891 Personal history of nicotine dependence: Secondary | ICD-10-CM | POA: Insufficient documentation

## 2021-03-27 DIAGNOSIS — N179 Acute kidney failure, unspecified: Secondary | ICD-10-CM

## 2021-03-27 DIAGNOSIS — D649 Anemia, unspecified: Secondary | ICD-10-CM

## 2021-03-27 DIAGNOSIS — G893 Neoplasm related pain (acute) (chronic): Secondary | ICD-10-CM | POA: Diagnosis not present

## 2021-03-27 DIAGNOSIS — D6481 Anemia due to antineoplastic chemotherapy: Secondary | ICD-10-CM

## 2021-03-27 DIAGNOSIS — Z79899 Other long term (current) drug therapy: Secondary | ICD-10-CM | POA: Diagnosis not present

## 2021-03-27 DIAGNOSIS — R791 Abnormal coagulation profile: Secondary | ICD-10-CM | POA: Diagnosis not present

## 2021-03-27 DIAGNOSIS — I10 Essential (primary) hypertension: Secondary | ICD-10-CM | POA: Diagnosis not present

## 2021-03-27 DIAGNOSIS — Z7189 Other specified counseling: Secondary | ICD-10-CM

## 2021-03-27 DIAGNOSIS — T451X5A Adverse effect of antineoplastic and immunosuppressive drugs, initial encounter: Secondary | ICD-10-CM | POA: Insufficient documentation

## 2021-03-27 DIAGNOSIS — E039 Hypothyroidism, unspecified: Secondary | ICD-10-CM | POA: Diagnosis not present

## 2021-03-27 DIAGNOSIS — E119 Type 2 diabetes mellitus without complications: Secondary | ICD-10-CM | POA: Insufficient documentation

## 2021-03-27 DIAGNOSIS — C8338 Diffuse large B-cell lymphoma, lymph nodes of multiple sites: Secondary | ICD-10-CM

## 2021-03-27 DIAGNOSIS — R519 Headache, unspecified: Secondary | ICD-10-CM | POA: Diagnosis present

## 2021-03-27 DIAGNOSIS — D500 Iron deficiency anemia secondary to blood loss (chronic): Secondary | ICD-10-CM

## 2021-03-27 LAB — CBC WITH DIFFERENTIAL/PLATELET
Abs Immature Granulocytes: 0.06 10*3/uL (ref 0.00–0.07)
Basophils Absolute: 0 10*3/uL (ref 0.0–0.1)
Basophils Relative: 0 %
Eosinophils Absolute: 0 10*3/uL (ref 0.0–0.5)
Eosinophils Relative: 2 %
HCT: 18.1 % — ABNORMAL LOW (ref 39.0–52.0)
Hemoglobin: 5.6 g/dL — ABNORMAL LOW (ref 13.0–17.0)
Immature Granulocytes: 10 %
Lymphocytes Relative: 50 %
Lymphs Abs: 0.3 10*3/uL — ABNORMAL LOW (ref 0.7–4.0)
MCH: 22.9 pg — ABNORMAL LOW (ref 26.0–34.0)
MCHC: 30.9 g/dL (ref 30.0–36.0)
MCV: 73.9 fL — ABNORMAL LOW (ref 80.0–100.0)
Monocytes Absolute: 0 10*3/uL — ABNORMAL LOW (ref 0.1–1.0)
Monocytes Relative: 3 %
Neutro Abs: 0.2 10*3/uL — CL (ref 1.7–7.7)
Neutrophils Relative %: 35 %
Platelets: 194 10*3/uL (ref 150–400)
RBC: 2.45 MIL/uL — ABNORMAL LOW (ref 4.22–5.81)
RDW: 19.5 % — ABNORMAL HIGH (ref 11.5–15.5)
WBC: 0.6 10*3/uL — CL (ref 4.0–10.5)
nRBC: 0 % (ref 0.0–0.2)

## 2021-03-27 LAB — COMPREHENSIVE METABOLIC PANEL
ALT: 26 U/L (ref 0–44)
AST: 15 U/L (ref 15–41)
Albumin: 2.7 g/dL — ABNORMAL LOW (ref 3.5–5.0)
Alkaline Phosphatase: 67 U/L (ref 38–126)
Anion gap: 8 (ref 5–15)
BUN: 20 mg/dL (ref 6–20)
CO2: 26 mmol/L (ref 22–32)
Calcium: 8.7 mg/dL — ABNORMAL LOW (ref 8.9–10.3)
Chloride: 96 mmol/L — ABNORMAL LOW (ref 98–111)
Creatinine, Ser: 0.98 mg/dL (ref 0.61–1.24)
GFR, Estimated: >60 mL/min (ref >60)
Glucose, Bld: 127 mg/dL — ABNORMAL HIGH (ref 70–99)
Potassium: 3.6 mmol/L (ref 3.5–5.1)
Sodium: 130 mmol/L — ABNORMAL LOW (ref 135–145)
Total Bilirubin: <0.1 mg/dL — ABNORMAL LOW (ref 0.3–1.2)
Total Protein: 5.9 g/dL — ABNORMAL LOW (ref 6.5–8.1)

## 2021-03-27 LAB — PROTIME-INR
INR: 1.1 (ref 0.8–1.2)
Prothrombin Time: 14.4 seconds (ref 11.4–15.2)

## 2021-03-27 LAB — SAMPLE TO BLOOD BANK

## 2021-03-27 LAB — PREPARE RBC (CROSSMATCH)

## 2021-03-27 LAB — TROPONIN I (HIGH SENSITIVITY): Troponin I (High Sensitivity): 4 ng/L (ref <18)

## 2021-03-27 LAB — APTT: aPTT: 32 seconds (ref 24–36)

## 2021-03-27 MED ORDER — HYDROMORPHONE HCL 1 MG/ML IJ SOLN
0.5000 mg | INTRAMUSCULAR | Status: DC | PRN
  Administered 2021-03-27 (×2): 0.5 mg via INTRAVENOUS
  Filled 2021-03-27 (×2): qty 1

## 2021-03-27 MED ORDER — SODIUM CHLORIDE 0.9% FLUSH
10.0000 mL | Freq: Once | INTRAVENOUS | Status: AC | PRN
  Administered 2021-03-27: 10 mL
  Filled 2021-03-27: qty 10

## 2021-03-27 MED ORDER — SODIUM CHLORIDE 0.9 % IV SOLN
Freq: Once | INTRAVENOUS | Status: AC
  Filled 2021-03-27: qty 250

## 2021-03-27 MED ORDER — PROCHLORPERAZINE EDISYLATE 10 MG/2ML IJ SOLN
10.0000 mg | Freq: Once | INTRAMUSCULAR | Status: AC
  Administered 2021-03-27: 10 mg via INTRAVENOUS
  Filled 2021-03-27: qty 2

## 2021-03-27 MED ORDER — PANTOPRAZOLE SODIUM 40 MG PO TBEC: 40.0000 mg | DELAYED_RELEASE_TABLET | Freq: Two times a day (BID) | ORAL | 0 refills | Status: AC

## 2021-03-27 MED ORDER — ACETAMINOPHEN 500 MG PO TABS
1000.0000 mg | ORAL_TABLET | Freq: Once | ORAL | Status: AC
  Administered 2021-03-27: 1000 mg via ORAL
  Filled 2021-03-27: qty 2

## 2021-03-27 MED ORDER — PANTOPRAZOLE SODIUM 40 MG PO TBEC: 40.0000 mg | DELAYED_RELEASE_TABLET | Freq: Two times a day (BID) | ORAL | 0 refills | Status: DC

## 2021-03-27 MED ORDER — CIPROFLOXACIN HCL 500 MG PO TABS
500.0000 mg | ORAL_TABLET | Freq: Once | ORAL | Status: AC
  Administered 2021-03-27: 500 mg via ORAL
  Filled 2021-03-27: qty 1

## 2021-03-27 MED ORDER — HEPARIN SOD (PORK) LOCK FLUSH 100 UNIT/ML IV SOLN
500.0000 [IU] | Freq: Once | INTRAVENOUS | Status: DC | PRN
  Filled 2021-03-27: qty 5

## 2021-03-27 MED ORDER — CIPROFLOXACIN HCL 500 MG PO TABS: 500.0000 mg | ORAL_TABLET | Freq: Every day | ORAL | 0 refills | Status: AC

## 2021-03-27 MED ORDER — SODIUM CHLORIDE 0.9 % IV SOLN
10.0000 mL/h | Freq: Once | INTRAVENOUS | Status: AC
  Administered 2021-03-27: 10 mL/h via INTRAVENOUS

## 2021-03-27 MED ORDER — DIPHENHYDRAMINE HCL 50 MG/ML IJ SOLN
12.5000 mg | Freq: Once | INTRAMUSCULAR | Status: AC
  Administered 2021-03-27: 12.5 mg via INTRAVENOUS
  Filled 2021-03-27: qty 1

## 2021-03-27 NOTE — Progress Notes (Signed)
Hematology/Oncology Follow Up Note [facility]  Telephone:([phone] [phone]  Patient Care Team: Sharyne Peach, MD as PCP - General (Family Medicine) Earlie Server, MD as Consulting Physician (Hematology and Oncology)   Name of the patient: Zachary [facility]  [id number]  1960-07-28   REASON FOR VISIT Follow-up for DLBCL  PERTINENT ONCOLOGY HISTORY -02/21/2021 EGD showed large fungating ulcerated noncircumferential mass with no bleeding and no stigmata of recent bleeding was found on the greater curvature of the stomach.  Biopsy was taken.  Large amount of necrotic and ulcerated appearing tissue.  Acute duodenitis.  Gastritis Awaiting for pathology report.  PET scan has been scheduled for outpatient. 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 Patient has also received PRBC transfusions as well as IV Venofer treatments daily x3 during hospitalization.  Today he reports feeling tired and fatigued.  Appetite is fair. Constipation, last bowel movement was yesterday.  Small amount.  He takes Senokot 1 tablet twice daily. Left flank pain, intermittent.  He takes pain medication.  # 02/26/2021 Bone marrow biopsy showed no bone marrow involvement. 03/09/2021, PET scan showed large ulcerated necrotic mass in direct communication with the gastric lumen, extending to splenic hilum, splenic parenchyma, also involving/and abutting the distal pancreas.  Likely extending into the splenorenal ligament and abutting the under surface of left hemi diagram.  Left adrenal nodularity and increased metabolic activity suspicious for adrenal metastasis. No increased metabolic activity about periportal and celiac lymph nodes.  INTERVAL HISTORY Zachary Perkins is a 60 y.o. male who has above history reviewed by me today presents for follow up for diffuse large B-cell lymphoma. 03/20/2021, status  post cycle 1 R-CHOP intrathecal MTX.  He reports appetite and feeling better when he was on the steroid last week. This week, appetite has decreased again.  He also feels lightheaded when he stands up and also shortness of breath with mild exertion. He tried fentanyl patch and did not tolerate due to nausea.  He feels the nausea symptoms are overall better and he is able to tolerate oxycodone better.  Pain is controlled.     Review of Systems  Constitutional:  Positive for appetite change, fatigue and unexpected weight change. Negative for chills and fever.  HENT:   Negative for hearing loss and voice change.   Eyes:  Negative for eye problems and icterus.  Respiratory:  Positive for shortness of breath. Negative for chest tightness and cough.   Cardiovascular:  Negative for chest pain and leg swelling.  Gastrointestinal:  Positive for nausea. Negative for abdominal distention, abdominal pain, constipation and vomiting.  Endocrine: Negative for hot flashes.  Genitourinary:  Negative for difficulty urinating, dysuria and frequency.   Musculoskeletal:  Negative for arthralgias.  Skin:  Negative for itching and rash.  Neurological:  Positive for light-headedness. Negative for numbness.  Hematological:  Negative for adenopathy. Does not bruise/bleed easily.  Psychiatric/Behavioral:  Negative for confusion.      Allergies  Allergen Reactions   Lisinopril Cough   Losartan Diarrhea   Metformin And Related     PASSED OUT     Past Medical History:  Diagnosis Date   Diabetes mellitus without complication ([place])    Diffuse large B cell lymphoma ([place])    Hypertension    Hypothyroidism    Morbid obesity ([place])      Past Surgical History:  Procedure Laterality Date   CARPAL TUNNEL  RELEASE Left    ESOPHAGOGASTRODUODENOSCOPY (EGD) WITH PROPOFOL N/A 02/21/2021   Procedure: ESOPHAGOGASTRODUODENOSCOPY (EGD) WITH PROPOFOL;  Surgeon: Annamaria Helling, DO;  Location: [facility];   Service: Gastroenterology;  Laterality: N/A;  after 2pm   INSERTION OF MESH N/A 05/02/2017   Procedure: INSERTION OF MESH;  Surgeon: Leonie Green, MD;  Location: [facility] ORS;  Service: General;  Laterality: N/A;   lasix eye surgery     PORTACATH PLACEMENT Right 03/09/2021   Procedure: INSERTION PORT-A-CATH;  Surgeon: Jules Husbands, MD;  Location: [facility] ORS;  Service: General;  Laterality: Right;   UMBILICAL HERNIA REPAIR N/A 05/02/2017   Procedure: HERNIA REPAIR UMBILICAL ADULT;  Surgeon: Leonie Green, MD;  Location: [facility] ORS;  Service: General;  Laterality: N/A;    Social History   Socioeconomic History   Marital status: Married    Spouse name: Zachary [facility]   Number of children: 0   Years of education: 12   Highest education level: 12th grade  Occupational History   Not on file  Tobacco Use   Smoking status: Former    Packs/day: 1.00    Years: 20.00    Pack years: 20.00    Types: Cigarettes    Quit date: 04/25/2007    Years since quitting: 13.9   Smokeless tobacco: Never  Vaping Use   Vaping Use: Never used  Substance and Sexual Activity   Alcohol use: Not Currently    Comment: rare   Drug use: No   Sexual activity: Yes  Other Topics Concern   Not on file  Social History Narrative   Not on file   Social Determinants of Health   Financial Resource Strain: Not on file  Food Insecurity: Not on file  Transportation Needs: Not on file  Physical Activity: Not on file  Stress: Not on file  Social Connections: Not on file  Intimate Partner Violence: Not on file    Family History  Problem Relation Age of Onset   COPD Father    Emphysema Father    Cancer - Lung Brother     No current facility-administered medications for this visit.  Current Outpatient Medications:    allopurinol (ZYLOPRIM) 100 MG tablet, Take 1 tablet (100 mg total) by mouth daily., Disp: 30 tablet, Rfl: 0   bisacodyl (DULCOLAX) 10 MG suppository, Place 1 suppository (10 mg total) rectally  daily as needed for severe constipation., Disp: 12 suppository, Rfl: 0   docusate sodium (COLACE) 100 MG capsule, Take 200 mg by mouth daily., Disp: , Rfl:    hydrochlorothiazide (HYDRODIURIL) 25 MG tablet, Take 25 mg by mouth daily., Disp: , Rfl:    levothyroxine (SYNTHROID) 300 MCG tablet, Take 300 mcg by mouth daily., Disp: , Rfl:    lidocaine-prilocaine (EMLA) cream, Apply to affected area once, Disp: 30 g, Rfl: 3   Multiple Vitamin (MULTI-VITAMIN) tablet, Take 1 tablet by mouth daily., Disp: , Rfl:    oxyCODONE (OXY IR/ROXICODONE) 5 MG immediate release tablet, Take 1 tablet (5 mg total) by mouth every 4 (four) hours as needed for moderate pain., Disp: 60 tablet, Rfl: 0   polyethylene glycol (MIRALAX / GLYCOLAX) 17 g packet, Take 17 g by mouth daily as needed for moderate constipation. (Patient taking differently: Take 17 g by mouth daily.), Disp: 14 each, Rfl: 0   potassium chloride (KLOR-CON) 10 MEQ tablet, Take 10 mEq by mouth daily., Disp: , Rfl:    prochlorperazine (COMPAZINE) 10 MG tablet, Take 1 tablet (10 mg total) by  mouth every 6 (six) hours as needed for nausea or vomiting., Disp: 90 tablet, Rfl: 1   fentaNYL (DURAGESIC) 25 MCG/HR, Place 1 patch onto the skin every 3 (three) days. (Patient not taking: Reported on 03/27/2021), Disp: 5 patch, Rfl: 0   loratadine (CLARITIN) 10 MG tablet, Take 10 mg by mouth daily. (Patient not taking: Reported on 03/27/2021), Disp: , Rfl:    ondansetron (ZOFRAN) 8 MG tablet, Take 1 tablet (8 mg total) by mouth every 8 (eight) hours as needed for nausea or vomiting. (Patient not taking: No sig reported), Disp: 90 tablet, Rfl: 1   pantoprazole (PROTONIX) 40 MG tablet, Take 1 tablet (40 mg total) by mouth 2 (two) times daily before a meal., Disp: 180 tablet, Rfl: 0   predniSONE (DELTASONE) 20 MG tablet, Take 5 tablets (100 mg total) by mouth daily. Take with food on days 2-5 of chemotherapy. (Patient not taking: Reported on 03/27/2021), Disp: 25 tablet, Rfl:  5   promethazine (PHENERGAN) 25 MG suppository, Place 1 suppository (25 mg total) rectally every 6 (six) hours as needed for nausea or vomiting. (Patient not taking: Reported on 03/27/2021), Disp: 24 each, Rfl: 0  Facility-Administered Medications Ordered in Other Visits:    HYDROmorphone (DILAUDID) injection 0.5 mg, 0.5 mg, Intravenous, Q2H PRN, Merlyn Lot, MD, 0.5 mg at 03/27/21 1716  Physical exam:  Vitals:   03/27/21 1339  BP: 122/85  Pulse: 77  Resp: 18  Temp: 99.1 F (37.3 C)    Physical Exam Constitutional:      General: He is not in acute distress. HENT:     Head: Normocephalic and atraumatic.  Eyes:     General: No scleral icterus. Cardiovascular:     Rate and Rhythm: Normal rate and regular rhythm.     Heart sounds: Normal heart sounds.  Pulmonary:     Effort: Pulmonary effort is normal. No respiratory distress.     Breath sounds: No wheezing.  Abdominal:     General: Bowel sounds are normal. There is no distension.     Palpations: Abdomen is soft.  Musculoskeletal:        General: No deformity. Normal range of motion.     Cervical back: Normal range of motion and neck supple.  Skin:    General: Skin is warm and dry.     Coloration: Skin is pale.     Findings: No erythema or rash.  Neurological:     Mental Status: He is alert and oriented to person, place, and time. Mental status is at baseline.     Cranial Nerves: No cranial nerve deficit.     Coordination: Coordination normal.  Psychiatric:        Mood and Affect: Mood normal.    CMP Latest Ref Rng & Units 03/27/2021  Glucose 70 - 99 mg/dL 127(H)  BUN 6 - 20 mg/dL 20  Creatinine 0.61 - 1.24 mg/dL 0.98  Sodium 135 - 145 mmol/L 130(L)  Potassium 3.5 - 5.1 mmol/L 3.6  Chloride 98 - 111 mmol/L 96(L)  CO2 22 - 32 mmol/L 26  Calcium 8.9 - 10.3 mg/dL 8.7(L)  Total Protein 6.5 - 8.1 g/dL 5.9(L)  Total Bilirubin 0.3 - 1.2 mg/dL <0.1(L)  Alkaline Phos 38 - 126 U/L 67  AST 15 - 41 U/L 15  ALT 0 - 44  U/L 26   CBC Latest Ref Rng & Units 03/27/2021  WBC 4.0 - 10.5 K/uL 0.6(LL)  Hemoglobin 13.0 - 17.0 g/dL 5.6(L)  Hematocrit 39.0 - 52.0 %  18.1(L)  Platelets 150 - 400 K/uL 194    RADIOGRAPHIC STUDIES: I have personally reviewed the radiological images as listed and agreed with the findings in the report. NM PET Image Initial (PI) Skull Base To Thigh  Result Date: 03/09/2021 CLINICAL DATA:  Initial treatment strategy for gastric mass. EXAM: NUCLEAR MEDICINE PET SKULL BASE TO THIGH TECHNIQUE: 14.6 mCi F-18 FDG was injected intravenously. Full-ring PET imaging was performed from the skull base to thigh after the radiotracer. CT data was obtained and used for attenuation correction and anatomic localization. Fasting blood glucose: 110 mg/dl COMPARISON:  February 20, 2021 CT of the abdomen and pelvis. FINDINGS: Mediastinal blood pool activity: SUV max 2.50 Liver activity: SUV max NA NECK: No hypermetabolic lymph nodes in the neck. Incidental CT findings: none CHEST: No hypermetabolic mediastinal or hilar nodes. No suspicious pulmonary nodules on the CT scan. Incidental CT findings: Minimal aortic atherosclerosis, signs of coronary artery disease and aortic calcification. Mild cardiac enlargement. Small LEFT-sided pleural effusion. No adenopathy by size criteria in the chest. Airways are patent. ABDOMEN/PELVIS: Large mass between stomach and spleen extending along gastrosplenic ligament and invading the splenic hilum and splenic parenchyma shows central necrosis and gas with communication with the gastric lumen. Mass is markedly hypermetabolic with a maximum SUV of 33.47. LEFT adrenal gland with thickening up to 13 mm in the axial plane and an area of increased metabolic activity (image 419/6) 4.65 maximum SUV. Periportal lymph nodes of concern on the previous CT without uptake beyond background liver. Largest celiac node without uptake above blood pool. No signs of adenopathy or solid organ metastases  elsewhere Incidental CT findings: No pericholecystic stranding. Liver with smooth contours. Pancreatic tail is intimately associated with mass in the gastrosplenic ligament which also extends into the splenorenal ligament. RIGHT adrenal gland is normal. No hydronephrosis. LEFT kidney is separate from the mass which appears to extend along the LEFT anterior renal fascia and other planes as discussed. Mass in direct communication with the stomach. No acute gastrointestinal process with signs of sigmoid diverticulosis and diverticular disease. Aortic atherosclerosis without aneurysm. Prostate unremarkable by CT. SKELETON: No focal hypermetabolic activity to suggest skeletal metastasis. Incidental CT findings: none IMPRESSION: Large ulcerated necrotic mass and direct communication with the gastric lumen, extending into splenic hilum and splenic parenchyma, also involving or abutting the distal pancreas, likely extending into the splenorenal ligament and abutting the under surface of the LEFT hemidiaphragm. This mass shows marked hypermetabolic features, LEFT adrenal nodularity and increased metabolic activity is suspicious for LEFT adrenal metastasis with a maximum SUV that is greater than liver activity. No increased metabolic activity about periportal and celiac lymph nodes. Electronically Signed   By: Zetta Bills M.D.   On: 03/09/2021 11:56   DG Chest Portable 1 View  Result Date: 03/27/2021 CLINICAL DATA:  Shortness of breath, anemia and neutropenia EXAM: PORTABLE CHEST 1 VIEW COMPARISON:  Chest radiograph 03/10/2019 FINDINGS: There is a right chest wall port in place with tip in the mid SVC. The cardiomediastinal silhouette is within normal limits There are patchy opacities in the lateral left base. There is no other focal consolidation. There is no pulmonary edema. There is no pleural effusion or pneumothorax. There is no acute osseous abnormality. IMPRESSION: Patchy opacities in the lateral left base are  favored to reflect atelectasis; however, infection cannot be excluded. Electronically Signed   By: Valetta Mole M.D.   On: 03/27/2021 15:34   DG Chest Port 1 View  Result Date: 03/09/2021  CLINICAL DATA:  Status post Port-A-Cath placement in a 60 year old male. EXAM: PORTABLE CHEST 1 VIEW COMPARISON:  May 19, 2018 and PET exam from September of 2022. FINDINGS: RIGHT-sided Port-A-Cath enters via IJ approach. This terminates in the mid to distal superior vena cava. Mild rotation to the RIGHT. Accounting for this cardiomediastinal contours and hilar structures are normal. Minimal LEFT basilar airspace disease and potential LEFT pleural effusion. No pneumothorax, RIGHT costodiaphragmatic sulcus is excluded from view. On limited assessment there is no acute skeletal process. IMPRESSION: Port-A-Cath tip in the mid to distal superior vena cava, central venous placement. No visible pneumothorax. RIGHT costodiaphragmatic sulcus is excluded from view on this semi erect radiograph. Correlate with symptoms to determine whether repeat radiograph may be warranted. Minimal LEFT basilar airspace disease and potential LEFT pleural effusion. Electronically Signed   By: Zetta Bills M.D.   On: 03/09/2021 11:31   CT BONE MARROW BIOPSY & ASPIRATION  Result Date: 02/26/2021 CLINICAL DATA:  Lymphoma EXAM: CT GUIDED DEEP ILIAC BONE ASPIRATION AND CORE BIOPSY TECHNIQUE: Patient was placed prone on the CT gantry and limited axial scans through the pelvis were obtained. Appropriate skin entry site was identified. Skin site was marked, prepped with chlorhexidine, draped in usual sterile fashion, and infiltrated locally with 1% lidocaine. Intravenous Fentanyl 124mg and Versed 290mwere administered as conscious sedation during continuous monitoring of the patient's level of consciousness and physiological / cardiorespiratory status by the radiology RN, with a total moderate sedation time of 10 minutes. Under CT fluoroscopic  guidance an 11-gauge Cook trocar bone needle was advanced into the left iliac bone just lateral to the sacroiliac joint. Once needle tip position was confirmed, core and aspiration samples were obtained, submitted to pathology for approval. Post procedure scans show no hematoma or fracture. Patient tolerated procedure well. COMPLICATIONS: COMPLICATIONS none IMPRESSION: 1. Technically successful CT guided left iliac bone core and aspiration biopsy. Electronically Signed   By: D Lucrezia Europe.D.   On: 02/26/2021 13:57   DG C-Arm 1-60 Min-No Report  Result Date: 03/09/2021 Fluoroscopy was utilized by the requesting physician.  No radiographic interpretation.   ECHOCARDIOGRAM COMPLETE  Result Date: 03/12/2021    ECHOCARDIOGRAM REPORT   [facility] Date of Exam: 03/12/2021 Medical Rec #:  [id number]     Height:       76.0 in Accession #:    [id number]    Weight:       270.0 lb Date of Birth:  1111-Jun-1962    BSA:          2.517 m Patient Age:    [age]        BP:           144/90 mmHg Patient Gender: M               HR:           57 bpm. Exam Location:  [facility] Procedure: 2D Echo, Cardiac Doppler, Color Doppler and Strain Analysis Indications:     Chemo Z09  History:         Patient has no prior history of Echocardiogram examinations.                  Risk Factors:Hypertension and Diabetes.  Sonographer:     JeSherrie Sporteferring Phys:  [id number] Lakeiagnosing Phys: MuKathlyn SacramentoD  Sonographer Comments: Suboptimal apical window. Global longitudinal strain was attempted. IMPRESSIONS  1. Left ventricular ejection fraction, by estimation, is 55 to 60%. The left ventricle has normal function. The left ventricle has no regional wall motion abnormalities. There is moderate left ventricular hypertrophy. Left ventricular diastolic parameters are consistent with Grade I diastolic dysfunction (impaired relaxation).  2. Right ventricular systolic function is normal. The right ventricular size is  normal. There is normal pulmonary artery systolic pressure.  3. Left atrial size was mildly dilated.  4. Right atrial size was mildly dilated.  5. The mitral valve is normal in structure. No evidence of mitral valve regurgitation. No evidence of mitral stenosis.  6. The aortic valve is normal in structure. Aortic valve regurgitation is not visualized. Mild aortic valve sclerosis is present, with no evidence of aortic valve stenosis.  7. Aortic dilatation noted. There is mild dilatation of the aortic root, measuring 40 mm. FINDINGS  Left Ventricle: Left ventricular ejection fraction, by estimation, is 55 to 60%. The left ventricle has normal function. The left ventricle has no regional wall motion abnormalities. Global longitudinal strain performed but not reported based on interpreter judgement due to suboptimal tracking. The left ventricular internal cavity size was normal in size. There is moderate left ventricular hypertrophy. Left ventricular diastolic parameters are consistent with Grade I diastolic dysfunction (impaired relaxation). Right Ventricle: The right ventricular size is normal. No increase in right ventricular wall thickness. Right ventricular systolic function is normal. There is normal pulmonary artery systolic pressure. The tricuspid regurgitant velocity is 2.73 m/s, and  with an assumed right atrial pressure of 5 mmHg, the estimated right ventricular systolic pressure is 25.9 mmHg. Left Atrium: Left atrial size was mildly dilated. Right Atrium: Right atrial size was mildly dilated. Pericardium: There is no evidence of pericardial effusion. Mitral Valve: The mitral valve is normal in structure. No evidence of mitral valve regurgitation. No evidence of mitral valve stenosis. Tricuspid Valve: The tricuspid valve is normal in structure. Tricuspid valve regurgitation is mild . No evidence of tricuspid stenosis. Aortic Valve: The aortic valve is normal in structure. Aortic valve regurgitation is not  visualized. Mild aortic valve sclerosis is present, with no evidence of aortic valve stenosis. Aortic valve mean gradient measures 2.0 mmHg. Aortic valve peak gradient measures 4.5 mmHg. Aortic valve area, by VTI measures 5.20 cm. Pulmonic Valve: The pulmonic valve was normal in structure. Pulmonic valve regurgitation is not visualized. No evidence of pulmonic stenosis. Aorta: Aortic dilatation noted. There is mild dilatation of the aortic root, measuring 40 mm. Venous: The inferior vena cava was not well visualized. IAS/Shunts: No atrial level shunt detected by color flow Doppler.  LEFT VENTRICLE PLAX 2D LVIDd:         4.90 cm  Diastology LVIDs:         3.30 cm  LV e' medial:    4.46 cm/s LV PW:         1.90 cm  LV E/e' medial:  10.8 LV IVS:        1.60 cm  LV e' lateral:   7.29 cm/s LVOT diam:     2.30 cm  LV E/e' lateral: 6.6 LV SV:         79 LV SV Index:   32 LVOT Area:     4.15 cm  LEFT ATRIUM             Index       RIGHT ATRIUM           Index LA diam:  3.80 cm 1.51 cm/m  RA Area:     22.90 cm LA Vol (A2C):   89.3 ml 35.48 ml/m RA Volume:   65.90 ml  26.19 ml/m LA Vol (A4C):   82.4 ml 32.74 ml/m LA Biplane Vol: 89.9 ml 35.72 ml/m  AORTIC VALVE                   PULMONIC VALVE AV Area (Vmax):    4.10 cm    PV Vmax:        0.51 m/s AV Area (Vmean):   4.30 cm    PV Peak grad:   1.0 mmHg AV Area (VTI):     5.20 cm    RVOT Peak grad: 4 mmHg AV Vmax:           106.50 cm/s AV Vmean:          69.350 cm/s AV VTI:            0.153 m AV Peak Grad:      4.5 mmHg AV Mean Grad:      2.0 mmHg LVOT Vmax:         105.00 cm/s LVOT Vmean:        71.700 cm/s LVOT VTI:          0.191 m LVOT/AV VTI ratio: 1.25  AORTA Ao Root diam: 3.93 cm MITRAL VALVE               TRICUSPID VALVE MV Area (PHT): 4.68 cm    TR Peak grad:   29.8 mmHg MV Decel Time: 162 msec    TR Vmax:        273.00 cm/s MV E velocity: 48.00 cm/s MV A velocity: 73.30 cm/s  SHUNTS MV E/A ratio:  0.65        Systemic VTI:  0.19 m                             Systemic Diam: 2.30 cm Kathlyn Sacramento MD Electronically signed by Kathlyn Sacramento MD Signature Date/Time: 03/12/2021/11:05:50 AM    Final    DG FLUORO GUIDED LOC OF NEEDLE/CATH TIP FOR SPINAL INJECT LT  Result Date: 03/21/2021 CLINICAL DATA:  Patient with diffuse large B-cell lymphoma request received for diagnostic and therapeutic lumbar puncture. EXAM: FLUOROSCOPICALLY GUIDED LUMBAR PUNCTURE FOR INTRATHECAL CHEMOTHERAPY AND DIAGNOSTIC LUMBAR PUNCTURE TECHNIQUE: Informed consent was obtained from the patient prior to the procedure, including potential complications of CSF leak requiring additional procedure, paresthesias, bleeding, infection, side effects from medication, seizures, headache, allergy, and pain. A 'time out' was performed. With the patient prone, the lower back was prepped with Betadine. 1% Lidocaine was used for local anesthesia. Lumbar puncture was performed at the L4-L5 using a 20 gauge needle with return of clear CSF. A total of 9 mL of CSF fluid was obtained and sent to the laboratory for analysis. Opening pressure 15 cm of H2O. 12 mg of methotrexate was injected into the subarachnoid space. The patient tolerated the procedure well without apparent complication. A sterile dressing was applied. FLUOROSCOPY TIME:  0.2 minute, 2 images IMPRESSION: Technically successful fluoroscopic guided lumbar puncture. Intrathecal injection of chemotherapy without complication. Read By: Tsosie Billing PA-C Electronically Signed   By: Kathreen Devoid M.D.   On: 03/21/2021 13:03     Assessment and plan  1. 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[place])   2. Neoplasm related pain   3.  Chemotherapy induced neutropenia ([facility])   4. Antineoplastic chemotherapy induced anemia   5. Symptomatic anemia   6. Hypercalcemia   7. AKI (acute kidney injury) ([place])   8. Goals of care, counseling/discussion   Cancer Staging DLBCL (diffuse large B cell lymphoma) ([place]) Staging form: Hodgkin  and Non-Hodgkin Lymphoma, AJCC 8th Edition - Clinical stage from 03/13/2021: Stage IV (Diffuse large B-cell lymphoma) - Signed by Earlie Server, MD on 03/15/2021  #Gastric mass status biopsy, splenic/adrenal involvement. MYC, BCL2, BCL6 FISH is negative.  Diffuse large B-cell lymphoma NOS I recommend 6 cycles of R- CHOP with Day 2 IT- MTX - chemo side effects were discussed. Status post cycle 1 treatment last week. Continue allopurinol 100 mg daily.  #Symptomatic anemia, likely chemotherapy-induced. Hemoglobin is 5.6 today. [facility] does not have the capacity to give blood transfusion today. Patient was sent to emergency room and I discussed with the ER physician. Recommend 2 units of PRBC transfusion. Patient will follow-up tomorrow with repeat blood work CMP and possible additional blood product transfusion.   #Chemotherapy-induced neutropenia.  Patient has received G-CSF support. He is afebrile.  No clinical signs of infection. I discussed with emergency room physician.  Patient may go home after he finishes to units of PRBC transfusion. I asked ER doctor to prescribe him Cipro 500 mg daily for 5 days prophylactically. Monitor CBC closely.  # Nausea and vomiting, continue Zofran and Compazine PRN.  Hold Phenergan suppository due to neutropenia.  Overall his symptom has improved.  # Abdominal pain, did not tolerate fentanyl patch.  Continue Oxycodone 66m Q4 hours as needed.    # baseline echo showed normal LVEF. Grade 1 diastolic dysfunction. Will refer him to cardiology in the future.   #renal impairment, his kidney function has improved.    # IDA, hemoglobin is improving.  Status post multiple IV Venofer treatments.  # Hypercalcemia, probably due to AKI, dehydration.  S/p Zometa x 1. Calcium has decreased.,     Follow-up  03/28/2021 lab np IVF.   ZEarlie Server MD, PhD  03/27/2021

## 2021-03-27 NOTE — ED Triage Notes (Signed)
Pt via POV from [place]. Pt was sent over here for a Hgb of 5.6 and WBC of 0.6. Pt has been having weakness and SOB. Denies any bleeding. Pt has a hx of non-Hodgkins Lymphoma. Pt is actively on chemotherapy. Pt is A&Ox4 and NAD.

## 2021-03-27 NOTE — ED Provider Notes (Signed)
[facility] Emergency Department Provider Note    Event Date/Time   First MD Initiated Contact with Patient 03/27/21 1601     (approximate)  I have reviewed the triage vital signs and the nursing notes.   HISTORY  Chief Complaint Abnormal Lab    HPI Zachary Perkins is a 60 y.o. male the below listed past medical history just recently starting chemotherapy presents to the ER for low blood counts.  Is feeling cold with generalized malaise.  Has not noted any melena or hematochezia no fevers or chills.  Does have mild headache at this time.  His oncologist had tried to get him into clinic for 2 units blood transfusion but they were unable to schedule him and was sent to the ER for transfusion and reassessment.  Past Medical History:  Diagnosis Date   Diabetes mellitus without complication ([place])    Diffuse large B cell lymphoma ([place])    Hypertension    Hypothyroidism    Morbid obesity ([place])    Family History  Problem Relation Age of Onset   COPD Father    Emphysema Father    Cancer - Lung Brother    Past Surgical History:  Procedure Laterality Date   CARPAL TUNNEL RELEASE Left    ESOPHAGOGASTRODUODENOSCOPY (EGD) WITH PROPOFOL N/A 02/21/2021   Procedure: ESOPHAGOGASTRODUODENOSCOPY (EGD) WITH PROPOFOL;  Surgeon: Annamaria Helling, DO;  Location: [place];  Service: Gastroenterology;  Laterality: N/A;  after 2pm   INSERTION OF MESH N/A 05/02/2017   Procedure: INSERTION OF MESH;  Surgeon: Leonie Green, MD;  Location: [facility];  Service: General;  Laterality: N/A;   lasix eye surgery     PORTACATH PLACEMENT Right 03/09/2021   Procedure: INSERTION PORT-A-CATH;  Surgeon: Jules Husbands, MD;  Location: [facility];  Service: General;  Laterality: Right;   UMBILICAL HERNIA REPAIR N/A 05/02/2017   Procedure: HERNIA REPAIR UMBILICAL ADULT;  Surgeon: Leonie Green, MD;  Location: [facility];  Service: General;  Laterality: N/A;   Patient  Active Problem List   Diagnosis Date Noted   Chemotherapy induced neutropenia ([place]) 03/27/2021   Neoplasm related pain 03/27/2021   Encounter for antineoplastic chemotherapy 03/20/2021   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[place]) 03/20/2021   Hypercalcemia 03/15/2021   Non-intractable vomiting 03/15/2021   AKI (acute kidney injury) ([place]) 03/15/2021   DLBCL (diffuse large B cell lymphoma) ([place]) 03/14/2021   Morbid obesity ([place]) 03/05/2021   Goals of care, counseling/discussion 02/28/2021   Iron deficiency anemia due to chronic blood loss    Gastric mass 02/20/2021   Weight loss 02/20/2021   Anemia 02/20/2021   Diabetes mellitus type II, controlled ([place]) 02/20/2021   Nausea in adult 02/20/2021   Constipation 02/20/2021   Essential hypertension 02/20/2021   Hypothyroidism 73/41/9379   Umbilical hernia without obstruction and without gangrene 11/12/2016   Prediabetes 11/23/2015      Prior to Admission medications   Medication Sig Start Date End Date Taking? Authorizing Provider  allopurinol (ZYLOPRIM) 100 MG tablet Take 1 tablet (100 mg total) by mouth daily. 03/15/21  Yes Earlie Server, MD  bisacodyl (DULCOLAX) 10 MG suppository Place 1 suppository (10 mg total) rectally daily as needed for severe constipation. 02/26/21  Yes Aline August, MD  ciprofloxacin (CIPRO) 500 MG tablet Take 1 tablet (500 mg total) by mouth daily for 4 days. 03/27/21 03/31/21 Yes Merlyn Lot, MD  docusate sodium (COLACE) 100 MG capsule Take 200 mg by  mouth daily.   Yes [provider]  hydrochlorothiazide (HYDRODIURIL) 25 MG tablet Take 25 mg by mouth daily.   Yes [provider]  levothyroxine (SYNTHROID) 300 MCG tablet Take 300 mcg by mouth daily. 02/12/21  Yes [provider]  lidocaine-prilocaine (EMLA) cream Apply to affected area once 03/15/21  Yes Earlie Server, MD  Multiple Vitamin (MULTI-VITAMIN) tablet Take 1 tablet by mouth daily.   Yes [provider]  oxyCODONE (OXY IR/ROXICODONE) 5 MG immediate release tablet Take 1 tablet (5 mg total) by mouth every 4 (four) hours as needed for moderate pain. 03/05/21  Yes Earlie Server, MD  pantoprazole (PROTONIX) 40 MG tablet Take 1 tablet (40 mg total) by mouth 2 (two) times daily before a meal. 03/27/21  Yes Earlie Server, MD  polyethylene glycol (MIRALAX / GLYCOLAX) 17 g packet Take 17 g by mouth daily as needed for moderate constipation. Patient taking differently: Take 17 g by mouth daily. 02/26/21  Yes Aline August, MD  potassium chloride (KLOR-CON) 10 MEQ tablet Take 10 mEq by mouth daily. 01/02/21  Yes [provider]  prochlorperazine (COMPAZINE) 10 MG tablet Take 1 tablet (10 mg total) by mouth every 6 (six) hours as needed for nausea or vomiting. 02/28/21  Yes Earlie Server, MD  fentaNYL (DURAGESIC) 25 MCG/HR Place 1 patch onto the skin every 3 (three) days. Patient not taking: No sig reported 03/20/21   Earlie Server, MD  loratadine (CLARITIN) 10 MG tablet Take 10 mg by mouth daily. Patient not taking: No sig reported    [provider]  ondansetron (ZOFRAN) 8 MG tablet Take 1 tablet (8 mg total) by mouth every 8 (eight) hours as needed for nausea or vomiting. Patient not taking: No sig reported 02/28/21   Earlie Server, MD  predniSONE (DELTASONE) 20 MG tablet Take 5 tablets (100 mg total) by mouth daily. Take with food on days 2-5 of chemotherapy. Patient not taking: No sig reported 03/15/21   Earlie Server, MD  promethazine (PHENERGAN) 25 MG suppository Place 1 suppository (25 mg total) rectally every 6 (six) hours as needed for nausea or vomiting. Patient not taking: No sig reported 03/14/21   Earlie Server, MD    Allergies Lisinopril, Losartan, and Metformin and related    Social History Social History   Tobacco Use   Smoking status: Former    Packs/day: 1.00    Years: 20.00    Pack years: 20.00    Types: Cigarettes    Quit date: 04/25/2007    Years since quitting: 13.9   Smokeless  tobacco: Never  Vaping Use   Vaping Use: Never used  Substance Use Topics   Alcohol use: Not Currently    Comment: rare   Drug use: No    Review of Systems Patient denies headaches, rhinorrhea, blurry vision, numbness, shortness of breath, chest pain, edema, cough, abdominal pain, nausea, vomiting, diarrhea, dysuria, fevers, rashes or hallucinations unless otherwise stated above in HPI. ____________________________________________   PHYSICAL EXAM:  VITAL SIGNS: Vitals:   03/27/21 2215 03/27/21 2230  BP: 138/82 138/83  Pulse: 71 73  Resp: 19 17  Temp:  97.8 F (36.6 C)  SpO2: 96% 98%    Constitutional: Alert and oriented.  Eyes: Conjunctivae are normal. pale Head: Atraumatic. Nose: No congestion/rhinnorhea. Mouth/Throat: Mucous membranes are moist.   Neck: No stridor. Painless ROM.  Cardiovascular: Normal rate, regular rhythm. Grossly normal heart sounds.  Good peripheral circulation. Respiratory: Normal respiratory effort.  No retractions. Lungs CTAB. Gastrointestinal:  Soft and nontender. No distention. No abdominal bruits. No CVA tenderness. Genitourinary:  Musculoskeletal: No lower extremity tenderness nor edema.  No joint effusions. Neurologic:  Normal speech and language. No gross focal neurologic deficits are appreciated. No facial droop Skin:  Skin is warm, dry and intact. No rash noted. Psychiatric: Mood and affect are normal. Speech and behavior are normal.  ____________________________________________   LABS (all labs ordered are listed, but only abnormal results are displayed)  Results for orders placed or performed during the hospital encounter of 03/27/21 (from the past 24 hour(s))  Troponin I (High Sensitivity)     Status: None   Collection Time: 03/27/21  3:14 PM  Result Value Ref Range   Troponin I (High Sensitivity) 4 <18 ng/L  Protime-INR     Status: None   Collection Time: 03/27/21  3:14 PM  Result Value Ref Range   Prothrombin Time 14.4 11.4 -  15.2 seconds   INR 1.1 0.8 - 1.2  APTT     Status: None   Collection Time: 03/27/21  3:14 PM  Result Value Ref Range   aPTT 32 24 - 36 seconds  Prepare RBC (crossmatch)     Status: None   Collection Time: 03/27/21  4:03 PM  Result Value Ref Range   Order Confirmation      ORDER PROCESSED BY BLOOD BANK Performed at [facility], [address] [place], [place] [address]   Type and screen [place]     Status: None (Preliminary result)   Collection Time: 03/27/21  4:39 PM  Result Value Ref Range   ABO/RH(D) O POS    Antibody Screen NEG    Sample Expiration 03/30/2021,2359    Unit Number [id number]    Blood Component Type RBC, LR IRR    Unit division 00    Status of Unit ISSUED    Transfusion Status OK TO TRANSFUSE    Crossmatch Result Compatible    Unit Number [id number]    Blood Component Type RBC, LR IRR    Unit division 00    Status of Unit ISSUED    Transfusion Status OK TO TRANSFUSE    Crossmatch Result      Compatible Performed at [facility], [address], [place], [place] [address]    ____________________________________________  EKG My review and personal interpretation at Time: 15:03   Indication: anemia  Rate: 80  Rhythm: sinus Axis: normal Other: normal intervals, no stemi ____________________________________________  RADIOLOGY  I personally reviewed all radiographic images ordered to evaluate for the above acute complaints and reviewed radiology reports and findings.  These findings were personally discussed with the patient.  Please see medical record for radiology report.  ____________________________________________   PROCEDURES  Procedure(s) performed:  Procedures    Critical Care performed: yes ____________________________________________   INITIAL IMPRESSION / ASSESSMENT AND PLAN / ED COURSE  Pertinent labs & imaging results that were available during my care of the patient were  reviewed by me and considered in my medical decision making (see chart for details).   DDX: anemia, neutropenia, ida, chemotherapy induced anemia, GIB  Donley Harland Cheever is a 60 y.o. who presents to the ED with symptoms as described above.  Patient frail-appearing but hemodynamically stable afebrile and nontoxic.  Will transfuse 2 units packed red blood cells.  Is complaining of mild headache will give Tylenol and headache cocktail does not have any focal neurodeficits.  Do not feel that CT imaging clinically indicated at this  time.  Discussed case in consultation with Dr. Tasia Catchings of oncology who agrees with plan for transfusion here in the ER and if feeling well will be appropriate for discharge home and outpatient clinic follow-up in the a.m.  Clinical Course as of 03/27/21 2309  Tue Mar 27, 2021  2053 Patient reassessed.  He is feeling improved.  Awaiting on second unit. [PR]  2235 Second unit completed.  Patient feels improved.  Patient has follow-up in oncology clinic tomorrow a.m.Marland Kitchen  Does appear stable and appropriate for outpatient follow-up. [PR]    Clinical Course User Index [PR] Merlyn Lot, MD    The patient was evaluated in Emergency Department today for the symptoms described in the history of present illness. He/she was evaluated in the context of the global COVID-19 pandemic, which necessitated consideration that the patient might be at risk for infection with the SARS-CoV-2 virus that causes COVID-19. Institutional protocols and algorithms that pertain to the evaluation of patients at risk for COVID-19 are in a state of rapid change based on information released by regulatory bodies including the CDC and federal and state organizations. These policies and algorithms were followed during the patient's care in the ED.  As part of my medical decision making, I reviewed the following data within the [facility] notes reviewed and incorporated, Labs reviewed, notes  from prior ED visits and [place] Controlled Substance Database   ____________________________________________   FINAL CLINICAL IMPRESSION(S) / ED DIAGNOSES  Final diagnoses:  Symptomatic anemia      NEW MEDICATIONS STARTED DURING THIS VISIT:  Discharge Medication List as of 03/27/2021 10:50 PM     START taking these medications   Details  ciprofloxacin (CIPRO) 500 MG tablet Take 1 tablet (500 mg total) by mouth daily for 4 days., Starting Tue 03/27/2021, Until Sat 03/31/2021, Normal         Note:  This document was prepared using Dragon voice recognition software and may include unintentional dictation errors.    Merlyn Lot, MD 03/27/21 [phone]

## 2021-03-27 NOTE — Progress Notes (Signed)
Pt here for IVF and MD evaluation. Critical lab reuly WBC 0.6 Hgb 5.6. Pt feels very weak. He is pale. Dyspnea with exertion. Unable to give 2 units of blood at this time in afternoon in Cancer center. Dr Tasia Catchings has requested patient to go to ED for treatment and blood transfusions. Pt has agreed to go to ED. RN will take patient to ED. Port needle will remain accessed in R chest.

## 2021-03-27 NOTE — Discharge Instructions (Addendum)
Follow up in Oncology clinic in the AM.  Cipro Rx has been sent to your pharmacy.  You can start that medication tomorrow after clinic.

## 2021-03-27 NOTE — Progress Notes (Signed)
Patient here for follow up. Pt reports that he gets light headed when he stand up and has had increased shortness of breath with mild activity. Pt also reports that he stopped using Fentanyl patch because it was making him nauseated.

## 2021-03-27 NOTE — ED Notes (Signed)
Right Chest Port; flushed with saline; de-accessed; gauze/ tegaderm applied; bleeding controlled; patient tolerated well

## 2021-03-27 NOTE — Progress Notes (Signed)
Nutrition Follow-up:  Patient with lymphoma (gastric mass).  Patient receiving R-CHOP.  Planning to go to ER due to anemia.  Met with patient in clinic.  Patient reports that nausea is better but still comes and goes.  Has been making smoothies with berries, juices, yogurt and ice. Made some chicken stew with dumplings and potatoes yesterday.  Drinking water mainly.  Milk at times seems to sit heavy on the stomach.    Did not like the samples shakes that RD gave on last visit (boost soothe, ensure complete, boost breeze)  Medications: reviewed  Labs: reviewed  Anthropometrics:   Weight not taken today   NUTRITION DIAGNOSIS: Inadequate oral intake continues   INTERVENTION:  Recipes booklet given including recipes for smoothies.       MONITORING, EVALUATION, GOAL: weight trends, intake   NEXT VISIT: Thursday, Oct 27 during infusion  Leatha Rohner B. Zenia Resides, [place], [place] Registered Dietitian [phone] (mobile)

## 2021-03-28 ENCOUNTER — Other Ambulatory Visit: Payer: Self-pay | Admitting: Oncology

## 2021-03-28 ENCOUNTER — Inpatient Hospital Stay (HOSPITAL_BASED_OUTPATIENT_CLINIC_OR_DEPARTMENT_OTHER): Payer: BC Managed Care – PPO | Admitting: Oncology

## 2021-03-28 ENCOUNTER — Inpatient Hospital Stay: Payer: BC Managed Care – PPO

## 2021-03-28 VITALS — BP 136/84 | HR 83 | Temp 97.5°F | Resp 16 | Wt 265.5 lb

## 2021-03-28 DIAGNOSIS — C8338 Diffuse large B-cell lymphoma, lymph nodes of multiple sites: Secondary | ICD-10-CM

## 2021-03-28 DIAGNOSIS — Z5112 Encounter for antineoplastic immunotherapy: Secondary | ICD-10-CM | POA: Diagnosis not present

## 2021-03-28 DIAGNOSIS — D649 Anemia, unspecified: Secondary | ICD-10-CM | POA: Diagnosis not present

## 2021-03-28 DIAGNOSIS — D500 Iron deficiency anemia secondary to blood loss (chronic): Secondary | ICD-10-CM

## 2021-03-28 LAB — CBC WITH DIFFERENTIAL/PLATELET
Abs Immature Granulocytes: 0 10*3/uL (ref 0.00–0.07)
Basophils Absolute: 0 10*3/uL (ref 0.0–0.1)
Basophils Relative: 0 %
Eosinophils Absolute: 0 10*3/uL (ref 0.0–0.5)
Eosinophils Relative: 0 %
HCT: 21.7 % — ABNORMAL LOW (ref 39.0–52.0)
Hemoglobin: 7.1 g/dL — ABNORMAL LOW (ref 13.0–17.0)
Immature Granulocytes: 0 %
Lymphocytes Relative: 70 %
Lymphs Abs: 0.2 10*3/uL — ABNORMAL LOW (ref 0.7–4.0)
MCH: 24.8 pg — ABNORMAL LOW (ref 26.0–34.0)
MCHC: 32.7 g/dL (ref 30.0–36.0)
MCV: 75.9 fL — ABNORMAL LOW (ref 80.0–100.0)
Monocytes Absolute: 0.1 10*3/uL (ref 0.1–1.0)
Monocytes Relative: 20 %
Neutro Abs: 0 10*3/uL — CL (ref 1.7–7.7)
Neutrophils Relative %: 10 %
Platelets: 154 10*3/uL (ref 150–400)
RBC: 2.86 MIL/uL — ABNORMAL LOW (ref 4.22–5.81)
RDW: 18.8 % — ABNORMAL HIGH (ref 11.5–15.5)
Smear Review: NORMAL
WBC: 0.3 10*3/uL — CL (ref 4.0–10.5)
nRBC: 0 % (ref 0.0–0.2)

## 2021-03-28 LAB — COMPREHENSIVE METABOLIC PANEL
ALT: 23 U/L (ref 0–44)
AST: 19 U/L (ref 15–41)
Albumin: 2.7 g/dL — ABNORMAL LOW (ref 3.5–5.0)
Alkaline Phosphatase: 64 U/L (ref 38–126)
Anion gap: 10 (ref 5–15)
BUN: 18 mg/dL (ref 6–20)
CO2: 23 mmol/L (ref 22–32)
Calcium: 8.5 mg/dL — ABNORMAL LOW (ref 8.9–10.3)
Chloride: 96 mmol/L — ABNORMAL LOW (ref 98–111)
Creatinine, Ser: 1.08 mg/dL (ref 0.61–1.24)
GFR, Estimated: >60 mL/min (ref >60)
Glucose, Bld: 179 mg/dL — ABNORMAL HIGH (ref 70–99)
Potassium: 3.9 mmol/L (ref 3.5–5.1)
Sodium: 129 mmol/L — ABNORMAL LOW (ref 135–145)
Total Bilirubin: 0.2 mg/dL — ABNORMAL LOW (ref 0.3–1.2)
Total Protein: 6 g/dL — ABNORMAL LOW (ref 6.5–8.1)

## 2021-03-28 LAB — PREPARE RBC (CROSSMATCH)

## 2021-03-28 MED ORDER — DIPHENHYDRAMINE HCL 25 MG PO CAPS
25.0000 mg | ORAL_CAPSULE | Freq: Once | ORAL | Status: AC
  Administered 2021-03-28: 25 mg via ORAL
  Filled 2021-03-28: qty 1

## 2021-03-28 MED ORDER — HEPARIN SOD (PORK) LOCK FLUSH 100 UNIT/ML IV SOLN
500.0000 [IU] | Freq: Every day | INTRAVENOUS | Status: DC | PRN
  Filled 2021-03-28: qty 5

## 2021-03-28 MED ORDER — HEPARIN SOD (PORK) LOCK FLUSH 100 UNIT/ML IV SOLN
INTRAVENOUS | Status: AC
  Administered 2021-03-28: 500 [IU]
  Filled 2021-03-28: qty 5

## 2021-03-28 MED ORDER — OXYCODONE HCL 5 MG PO TABS: 5.0000 mg | ORAL_TABLET | ORAL | 0 refills | Status: AC | PRN

## 2021-03-28 MED ORDER — SODIUM CHLORIDE 0.9% IV SOLUTION
250.0000 mL | Freq: Once | INTRAVENOUS | Status: AC
  Administered 2021-03-28: 250 mL via INTRAVENOUS
  Filled 2021-03-28: qty 250

## 2021-03-28 MED ORDER — ACETAMINOPHEN 325 MG PO TABS
650.0000 mg | ORAL_TABLET | Freq: Once | ORAL | Status: AC
  Administered 2021-03-28: 650 mg via ORAL
  Filled 2021-03-28: qty 2

## 2021-03-28 NOTE — Progress Notes (Signed)
Hematology/Oncology Follow Up Note [facility]  Telephone:([phone] [phone]  Patient Care Team: Sharyne Peach, MD as PCP - General (Family Medicine) Earlie Server, MD as Consulting Physician (Hematology and Oncology)   Name of the patient: Zachary Perkins  [id number]  12-Dec-1960   REASON FOR VISIT Follow-up for DLBCL  PERTINENT ONCOLOGY HISTORY -02/21/2021 EGD showed large fungating ulcerated noncircumferential mass with no bleeding and no stigmata of recent bleeding was found on the greater curvature of the stomach.  Biopsy was taken.  Large amount of necrotic and ulcerated appearing tissue.  Acute duodenitis.  Gastritis Awaiting for pathology report.  PET scan has been scheduled for outpatient. 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 Patient has also received PRBC transfusions as well as IV Venofer treatments daily x3 during hospitalization.  Today he reports feeling tired and fatigued.  Appetite is fair. Constipation, last bowel movement was yesterday.  Small amount.  He takes Senokot 1 tablet twice daily. Left flank pain, intermittent.  He takes pain medication.  # 02/26/2021 Bone marrow biopsy showed no bone marrow involvement. 03/09/2021, PET scan showed large ulcerated necrotic mass in direct communication with the gastric lumen, extending to splenic hilum, splenic parenchyma, also involving/and abutting the distal pancreas.  Likely extending into the splenorenal ligament and abutting the under surface of left hemi diagram.  Left adrenal nodularity and increased metabolic activity suspicious for adrenal metastasis. No increased metabolic activity about periportal and celiac lymph nodes.   INTERVAL HISTORY Mr. Tal is a 60 year old male who presents for follow-up.  He is status post 1 cycle of R-CHOP with intrathecal methotrexate.  He was seen in the ED  yesterday for chemo induced anemia (5.6) and neutropenia and received 2 units of PRBC.  He received Udenyca after his treatment on 03/22/2021.  He presents back today for follow-up and additional blood or iron. He is with his wife.  He continues to feel poorly.  Has fatigue and shortness of breath.  Feels better than yesterday.  Denies any bleeding.  He was started on ciprofloxacin for neutropenia yesterday.  Appears to be tolerating well.  Review of Systems  Constitutional:  Positive for fatigue.  Respiratory:  Positive for shortness of breath.   Gastrointestinal:  Positive for nausea.     Allergies  Allergen Reactions   Lisinopril Cough   Losartan Diarrhea   Metformin And Related     PASSED OUT     Past Medical History:  Diagnosis Date   Diabetes mellitus without complication ([place])    Diffuse large B cell lymphoma ([place])    Hypertension    Hypothyroidism    Morbid obesity ([place])      Past Surgical History:  Procedure Laterality Date   CARPAL TUNNEL RELEASE Left    ESOPHAGOGASTRODUODENOSCOPY (EGD) WITH PROPOFOL N/A 02/21/2021   Procedure: ESOPHAGOGASTRODUODENOSCOPY (EGD) WITH PROPOFOL;  Surgeon: Annamaria Helling, DO;  Location: [facility] ENDOSCOPY;  Service: Gastroenterology;  Laterality: N/A;  after 2pm   INSERTION OF MESH N/A 05/02/2017   Procedure: INSERTION OF MESH;  Surgeon: Leonie Green, MD;  Location: [facility] ORS;  Service: General;  Laterality: N/A;   lasix eye surgery     PORTACATH PLACEMENT Right 03/09/2021   Procedure: INSERTION PORT-A-CATH;  Surgeon: Jules Husbands, MD;  Location: [facility] ORS;  Service: General;  Laterality: Right;   UMBILICAL HERNIA REPAIR N/A 05/02/2017   Procedure: HERNIA REPAIR UMBILICAL  ADULT;  Surgeon: Leonie Green, MD;  Location: [facility] ORS;  Service: General;  Laterality: N/A;    Social History   Socioeconomic History   Marital status: Married    Spouse name: Melonie   Number of children: 0   Years of education: 12   Highest  education level: 12th grade  Occupational History   Not on file  Tobacco Use   Smoking status: Former    Packs/day: 1.00    Years: 20.00    Pack years: 20.00    Types: Cigarettes    Quit date: 04/25/2007    Years since quitting: 13.9   Smokeless tobacco: Never  Vaping Use   Vaping Use: Never used  Substance and Sexual Activity   Alcohol use: Not Currently    Comment: rare   Drug use: No   Sexual activity: Yes  Other Topics Concern   Not on file  Social History Narrative   Not on file   Social Determinants of Health   Financial Resource Strain: Not on file  Food Insecurity: Not on file  Transportation Needs: Not on file  Physical Activity: Not on file  Stress: Not on file  Social Connections: Not on file  Intimate Partner Violence: Not on file    Family History  Problem Relation Age of Onset   COPD Father    Emphysema Father    Cancer - Lung Brother      Current Outpatient Medications:    allopurinol (ZYLOPRIM) 100 MG tablet, Take 1 tablet (100 mg total) by mouth daily., Disp: 30 tablet, Rfl: 0   bisacodyl (DULCOLAX) 10 MG suppository, Place 1 suppository (10 mg total) rectally daily as needed for severe constipation., Disp: 12 suppository, Rfl: 0   ciprofloxacin (CIPRO) 500 MG tablet, Take 1 tablet (500 mg total) by mouth daily for 4 days., Disp: 4 tablet, Rfl: 0   docusate sodium (COLACE) 100 MG capsule, Take 200 mg by mouth daily., Disp: , Rfl:    hydrochlorothiazide (HYDRODIURIL) 25 MG tablet, Take 25 mg by mouth daily., Disp: , Rfl:    levothyroxine (SYNTHROID) 300 MCG tablet, Take 300 mcg by mouth daily., Disp: , Rfl:    lidocaine-prilocaine (EMLA) cream, Apply to affected area once, Disp: 30 g, Rfl: 3   Multiple Vitamin (MULTI-VITAMIN) tablet, Take 1 tablet by mouth daily., Disp: , Rfl:    oxyCODONE (OXY IR/ROXICODONE) 5 MG immediate release tablet, Take 1 tablet (5 mg total) by mouth every 4 (four) hours as needed for moderate pain., Disp: 60 tablet, Rfl: 0    pantoprazole (PROTONIX) 40 MG tablet, Take 1 tablet (40 mg total) by mouth 2 (two) times daily before a meal., Disp: 180 tablet, Rfl: 0   polyethylene glycol (MIRALAX / GLYCOLAX) 17 g packet, Take 17 g by mouth daily as needed for moderate constipation. (Patient taking differently: Take 17 g by mouth daily.), Disp: 14 each, Rfl: 0   potassium chloride (KLOR-CON) 10 MEQ tablet, Take 10 mEq by mouth daily., Disp: , Rfl:    prochlorperazine (COMPAZINE) 10 MG tablet, Take 1 tablet (10 mg total) by mouth every 6 (six) hours as needed for nausea or vomiting., Disp: 90 tablet, Rfl: 1   fentaNYL (DURAGESIC) 25 MCG/HR, Place 1 patch onto the skin every 3 (three) days. (Patient not taking: No sig reported), Disp: 5 patch, Rfl: 0   loratadine (CLARITIN) 10 MG tablet, Take 10 mg by mouth daily. (Patient not taking: No sig reported), Disp: , Rfl:    ondansetron (  ZOFRAN) 8 MG tablet, Take 1 tablet (8 mg total) by mouth every 8 (eight) hours as needed for nausea or vomiting. (Patient not taking: No sig reported), Disp: 90 tablet, Rfl: 1   predniSONE (DELTASONE) 20 MG tablet, Take 5 tablets (100 mg total) by mouth daily. Take with food on days 2-5 of chemotherapy. (Patient not taking: No sig reported), Disp: 25 tablet, Rfl: 5   promethazine (PHENERGAN) 25 MG suppository, Place 1 suppository (25 mg total) rectally every 6 (six) hours as needed for nausea or vomiting. (Patient not taking: No sig reported), Disp: 24 each, Rfl: 0  Physical exam:  Vitals:   03/28/21 0833  BP: 136/84  Pulse: 83  Resp: 16  Temp: (!) 97.5 F (36.4 C)  SpO2: 97%  Weight: 265 lb 8 oz (120.4 kg)    Physical Exam Constitutional:      Appearance: Normal appearance.  HENT:     Head: Normocephalic and atraumatic.  Eyes:     Pupils: Pupils are equal, round, and reactive to light.  Cardiovascular:     Rate and Rhythm: Normal rate and regular rhythm.     Heart sounds: Normal heart sounds. No murmur heard. Pulmonary:     Effort:  Pulmonary effort is normal.     Breath sounds: Normal breath sounds. No wheezing.  Abdominal:     General: Bowel sounds are normal. There is no distension.     Palpations: Abdomen is soft.     Tenderness: There is no abdominal tenderness.  Musculoskeletal:        General: Normal range of motion.     Cervical back: Normal range of motion.  Skin:    General: Skin is warm and dry.     Coloration: Skin is pale.     Findings: No rash.  Neurological:     Mental Status: He is alert and oriented to person, place, and time.  Psychiatric:        Judgment: Judgment normal.    CMP Latest Ref Rng & Units 03/28/2021  Glucose 70 - 99 mg/dL 179(H)  BUN 6 - 20 mg/dL 18  Creatinine 0.61 - 1.24 mg/dL 1.08  Sodium 135 - 145 mmol/L 129(L)  Potassium 3.5 - 5.1 mmol/L 3.9  Chloride 98 - 111 mmol/L 96(L)  CO2 22 - 32 mmol/L 23  Calcium 8.9 - 10.3 mg/dL 8.5(L)  Total Protein 6.5 - 8.1 g/dL 6.0(L)  Total Bilirubin 0.3 - 1.2 mg/dL 0.2(L)  Alkaline Phos 38 - 126 U/L 64  AST 15 - 41 U/L 19  ALT 0 - 44 U/L 23   CBC Latest Ref Rng & Units 03/28/2021  WBC 4.0 - 10.5 K/uL 0.3(LL)  Hemoglobin 13.0 - 17.0 g/dL 7.1(L)  Hematocrit 39.0 - 52.0 % 21.7(L)  Platelets 150 - 400 K/uL 154    RADIOGRAPHIC STUDIES: I have personally reviewed the radiological images as listed and agreed with the findings in the report. NM PET Image Initial (PI) Skull Base To Thigh  Result Date: 03/09/2021 CLINICAL DATA:  Initial treatment strategy for gastric mass. EXAM: NUCLEAR MEDICINE PET SKULL BASE TO THIGH TECHNIQUE: 14.6 mCi F-18 FDG was injected intravenously. Full-ring PET imaging was performed from the skull base to thigh after the radiotracer. CT data was obtained and used for attenuation correction and anatomic localization. Fasting blood glucose: 110 mg/dl COMPARISON:  February 20, 2021 CT of the abdomen and pelvis. FINDINGS: Mediastinal blood pool activity: SUV max 2.50 Liver activity: SUV max NA NECK: No hypermetabolic  lymph  nodes in the neck. Incidental CT findings: none CHEST: No hypermetabolic mediastinal or hilar nodes. No suspicious pulmonary nodules on the CT scan. Incidental CT findings: Minimal aortic atherosclerosis, signs of coronary artery disease and aortic calcification. Mild cardiac enlargement. Small LEFT-sided pleural effusion. No adenopathy by size criteria in the chest. Airways are patent. ABDOMEN/PELVIS: Large mass between stomach and spleen extending along gastrosplenic ligament and invading the splenic hilum and splenic parenchyma shows central necrosis and gas with communication with the gastric lumen. Mass is markedly hypermetabolic with a maximum SUV of 33.47. LEFT adrenal gland with thickening up to 13 mm in the axial plane and an area of increased metabolic activity (image 786/7) 4.65 maximum SUV. Periportal lymph nodes of concern on the previous CT without uptake beyond background liver. Largest celiac node without uptake above blood pool. No signs of adenopathy or solid organ metastases elsewhere Incidental CT findings: No pericholecystic stranding. Liver with smooth contours. Pancreatic tail is intimately associated with mass in the gastrosplenic ligament which also extends into the splenorenal ligament. RIGHT adrenal gland is normal. No hydronephrosis. LEFT kidney is separate from the mass which appears to extend along the LEFT anterior renal fascia and other planes as discussed. Mass in direct communication with the stomach. No acute gastrointestinal process with signs of sigmoid diverticulosis and diverticular disease. Aortic atherosclerosis without aneurysm. Prostate unremarkable by CT. SKELETON: No focal hypermetabolic activity to suggest skeletal metastasis. Incidental CT findings: none IMPRESSION: Large ulcerated necrotic mass and direct communication with the gastric lumen, extending into splenic hilum and splenic parenchyma, also involving or abutting the distal pancreas, likely extending into  the splenorenal ligament and abutting the under surface of the LEFT hemidiaphragm. This mass shows marked hypermetabolic features, LEFT adrenal nodularity and increased metabolic activity is suspicious for LEFT adrenal metastasis with a maximum SUV that is greater than liver activity. No increased metabolic activity about periportal and celiac lymph nodes. Electronically Signed   By: Zetta Bills M.D.   On: 03/09/2021 11:56   DG Chest Portable 1 View  Result Date: 03/27/2021 CLINICAL DATA:  Shortness of breath, anemia and neutropenia EXAM: PORTABLE CHEST 1 VIEW COMPARISON:  Chest radiograph 03/10/2019 FINDINGS: There is a right chest wall port in place with tip in the mid SVC. The cardiomediastinal silhouette is within normal limits There are patchy opacities in the lateral left base. There is no other focal consolidation. There is no pulmonary edema. There is no pleural effusion or pneumothorax. There is no acute osseous abnormality. IMPRESSION: Patchy opacities in the lateral left base are favored to reflect atelectasis; however, infection cannot be excluded. Electronically Signed   By: Valetta Mole M.D.   On: 03/27/2021 15:34   DG Chest Port 1 View  Result Date: 03/09/2021 CLINICAL DATA:  Status post Port-A-Cath placement in a 60 year old male. EXAM: PORTABLE CHEST 1 VIEW COMPARISON:  May 19, 2018 and PET exam from September of 2022. FINDINGS: RIGHT-sided Port-A-Cath enters via IJ approach. This terminates in the mid to distal superior vena cava. Mild rotation to the RIGHT. Accounting for this cardiomediastinal contours and hilar structures are normal. Minimal LEFT basilar airspace disease and potential LEFT pleural effusion. No pneumothorax, RIGHT costodiaphragmatic sulcus is excluded from view. On limited assessment there is no acute skeletal process. IMPRESSION: Port-A-Cath tip in the mid to distal superior vena cava, central venous placement. No visible pneumothorax. RIGHT costodiaphragmatic  sulcus is excluded from view on this semi erect radiograph. Correlate with symptoms to determine whether repeat radiograph may be  warranted. Minimal LEFT basilar airspace disease and potential LEFT pleural effusion. Electronically Signed   By: Zetta Bills M.D.   On: 03/09/2021 11:31   CT BONE MARROW BIOPSY & ASPIRATION  Result Date: 02/26/2021 CLINICAL DATA:  Lymphoma EXAM: CT GUIDED DEEP ILIAC BONE ASPIRATION AND CORE BIOPSY TECHNIQUE: Patient was placed prone on the CT gantry and limited axial scans through the pelvis were obtained. Appropriate skin entry site was identified. Skin site was marked, prepped with chlorhexidine, draped in usual sterile fashion, and infiltrated locally with 1% lidocaine. Intravenous Fentanyl 124mg and Versed 259mwere administered as conscious sedation during continuous monitoring of the patient's level of consciousness and physiological / cardiorespiratory status by the radiology RN, with a total moderate sedation time of 10 minutes. Under CT fluoroscopic guidance an 11-gauge Cook trocar bone needle was advanced into the left iliac bone just lateral to the sacroiliac joint. Once needle tip position was confirmed, core and aspiration samples were obtained, submitted to pathology for approval. Post procedure scans show no hematoma or fracture. Patient tolerated procedure well. COMPLICATIONS: COMPLICATIONS none IMPRESSION: 1. Technically successful CT guided left iliac bone core and aspiration biopsy. Electronically Signed   By: D Lucrezia Europe.D.   On: 02/26/2021 13:57   DG C-Arm 1-60 Min-No Report  Result Date: 03/09/2021 Fluoroscopy was utilized by the requesting physician.  No radiographic interpretation.   ECHOCARDIOGRAM COMPLETE  Result Date: 03/12/2021    ECHOCARDIOGRAM REPORT   Patient Name:   TOCasa AmistadOCKE Date of Exam: 03/12/2021 Medical Rec #:  [id number]     Height:       76.0 in Accession #:    [id number]    Weight:       270.0 lb Date of Birth:  1107-09-1960     BSA:          2.517 m Patient Age:    [age]        BP:           144/90 mmHg Patient Gender: M               HR:           57 bpm. Exam Location:  [facility] Procedure: 2D Echo, Cardiac Doppler, Color Doppler and Strain Analysis Indications:     Chemo Z09  History:         Patient has no prior history of Echocardiogram examinations.                  Risk Factors:Hypertension and Diabetes.  Sonographer:     JeSherrie Sporteferring Phys:  [id number] Phys: MuKathlyn SacramentoD  Sonographer Comments: Suboptimal apical window. Global longitudinal strain was attempted. IMPRESSIONS  1. Left ventricular ejection fraction, by estimation, is 55 to 60%. The left ventricle has normal function. The left ventricle has no regional wall motion abnormalities. There is moderate left ventricular hypertrophy. Left ventricular diastolic parameters are consistent with Grade I diastolic dysfunction (impaired relaxation).  2. Right ventricular systolic function is normal. The right ventricular size is normal. There is normal pulmonary artery systolic pressure.  3. Left atrial size was mildly dilated.  4. Right atrial size was mildly dilated.  5. The mitral valve is normal in structure. No evidence of mitral valve regurgitation. No evidence of mitral stenosis.  6. The aortic valve is normal in structure. Aortic valve regurgitation is not visualized. Mild aortic valve sclerosis is present, with no evidence of  aortic valve stenosis.  7. Aortic dilatation noted. There is mild dilatation of the aortic root, measuring 40 mm. FINDINGS  Left Ventricle: Left ventricular ejection fraction, by estimation, is 55 to 60%. The left ventricle has normal function. The left ventricle has no regional wall motion abnormalities. Global longitudinal strain performed but not reported based on interpreter judgement due to suboptimal tracking. The left ventricular internal cavity size was normal in size. There is moderate left ventricular hypertrophy.  Left ventricular diastolic parameters are consistent with Grade I diastolic dysfunction (impaired relaxation). Right Ventricle: The right ventricular size is normal. No increase in right ventricular wall thickness. Right ventricular systolic function is normal. There is normal pulmonary artery systolic pressure. The tricuspid regurgitant velocity is 2.73 m/s, and  with an assumed right atrial pressure of 5 mmHg, the estimated right ventricular systolic pressure is 01.7 mmHg. Left Atrium: Left atrial size was mildly dilated. Right Atrium: Right atrial size was mildly dilated. Pericardium: There is no evidence of pericardial effusion. Mitral Valve: The mitral valve is normal in structure. No evidence of mitral valve regurgitation. No evidence of mitral valve stenosis. Tricuspid Valve: The tricuspid valve is normal in structure. Tricuspid valve regurgitation is mild . No evidence of tricuspid stenosis. Aortic Valve: The aortic valve is normal in structure. Aortic valve regurgitation is not visualized. Mild aortic valve sclerosis is present, with no evidence of aortic valve stenosis. Aortic valve mean gradient measures 2.0 mmHg. Aortic valve peak gradient measures 4.5 mmHg. Aortic valve area, by VTI measures 5.20 cm. Pulmonic Valve: The pulmonic valve was normal in structure. Pulmonic valve regurgitation is not visualized. No evidence of pulmonic stenosis. Aorta: Aortic dilatation noted. There is mild dilatation of the aortic root, measuring 40 mm. Venous: The inferior vena cava was not well visualized. IAS/Shunts: No atrial level shunt detected by color flow Doppler.  LEFT VENTRICLE PLAX 2D LVIDd:         4.90 cm  Diastology LVIDs:         3.30 cm  LV e' medial:    4.46 cm/s LV PW:         1.90 cm  LV E/e' medial:  10.8 LV IVS:        1.60 cm  LV e' lateral:   7.29 cm/s LVOT diam:     2.30 cm  LV E/e' lateral: 6.6 LV SV:         79 LV SV Index:   32 LVOT Area:     4.15 cm  LEFT ATRIUM             Index       RIGHT  ATRIUM           Index LA diam:        3.80 cm 1.51 cm/m  RA Area:     22.90 cm LA Vol (A2C):   89.3 ml 35.48 ml/m RA Volume:   65.90 ml  26.19 ml/m LA Vol (A4C):   82.4 ml 32.74 ml/m LA Biplane Vol: 89.9 ml 35.72 ml/m  AORTIC VALVE                   PULMONIC VALVE AV Area (Vmax):    4.10 cm    PV Vmax:        0.51 m/s AV Area (Vmean):   4.30 cm    PV Peak grad:   1.0 mmHg AV Area (VTI):     5.20 cm    RVOT Peak grad: 4  mmHg AV Vmax:           106.50 cm/s AV Vmean:          69.350 cm/s AV VTI:            0.153 m AV Peak Grad:      4.5 mmHg AV Mean Grad:      2.0 mmHg LVOT Vmax:         105.00 cm/s LVOT Vmean:        71.700 cm/s LVOT VTI:          0.191 m LVOT/AV VTI ratio: 1.25  AORTA Ao Root diam: 3.93 cm MITRAL VALVE               TRICUSPID VALVE MV Area (PHT): 4.68 cm    TR Peak grad:   29.8 mmHg MV Decel Time: 162 msec    TR Vmax:        273.00 cm/s MV E velocity: 48.00 cm/s MV A velocity: 73.30 cm/s  SHUNTS MV E/A ratio:  0.65        Systemic VTI:  0.19 m                            Systemic Diam: 2.30 cm Kathlyn Sacramento MD Electronically signed by Kathlyn Sacramento MD Signature Date/Time: 03/12/2021/11:05:50 AM    Final    DG FLUORO GUIDED LOC OF NEEDLE/CATH TIP FOR SPINAL INJECT LT  Result Date: 03/21/2021 CLINICAL DATA:  Patient with diffuse large B-cell lymphoma request received for diagnostic and therapeutic lumbar puncture. EXAM: FLUOROSCOPICALLY GUIDED LUMBAR PUNCTURE FOR INTRATHECAL CHEMOTHERAPY AND DIAGNOSTIC LUMBAR PUNCTURE TECHNIQUE: Informed consent was obtained from the patient prior to the procedure, including potential complications of CSF leak requiring additional procedure, paresthesias, bleeding, infection, side effects from medication, seizures, headache, allergy, and pain. A 'time out' was performed. With the patient prone, the lower back was prepped with Betadine. 1% Lidocaine was used for local anesthesia. Lumbar puncture was performed at the L4-L5 using a 20 gauge needle with  return of clear CSF. A total of 9 mL of CSF fluid was obtained and sent to the laboratory for analysis. Opening pressure 15 cm of H2O. 12 mg of methotrexate was injected into the subarachnoid space. The patient tolerated the procedure well without apparent complication. A sterile dressing was applied. FLUOROSCOPY TIME:  0.2 minute, 2 images IMPRESSION: Technically successful fluoroscopic guided lumbar puncture. Intrathecal injection of chemotherapy without complication. Read By: Tsosie Billing PA-C Electronically Signed   By: Kathreen Devoid M.D.   On: 03/21/2021 13:03     Assessment and plan  1. 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 Anemia, unspecified type   Cancer Staging DLBCL (diffuse large B cell lymphoma) ([place]) Staging form: Hodgkin and Non-Hodgkin Lymphoma, AJCC 8th Edition - Clinical stage from 03/13/2021: Stage IV (Diffuse large B-cell lymphoma) - Signed by Earlie Server, MD on 03/15/2021  Diffuse large B-cell lymphoma- Status post biopsy.  He is followed by Dr. Tasia Catchings.  Received cycle 1 of R-CHOP with day 2 of intrathecal methotrexate.  He is on prophylactic allopurinol.  He was here yesterday for 1 week follow-up and found to have severe anemia with a hemoglobin of 5.6, neutropenia ANC of 200.  He was sent to the emergency room for blood transfusion.  He is scheduled for cycle 2 on 04/10/2021.  Chemo induced Anemia- He was given 2 units PRBC in the  ED and asked to return today for additional blood and lab work. .  Labs from today show hemoglobin 7.1, platelets 154 with with an ANC of 0.0.  Proceed with 1 unit PRBC and continue Cipro.  Neutropenia- Received G-CSF support last week.  ANC is 0.0. He is on prophylactic ciprofloxacin X 5 days-no fevers  Nausea/vomiting- Continue Zofran and Compazine.  No Phenergan suppository secondary to neutropenia.  Malignancy related pain- Continue oxycodone 5 mg every 4 hours as needed.  Hypercalcemia- S/p 1 dose Zometa.  Calcium level has improved and is 8.5 today.  Disposition- 1 unit PRBC today.  Return to clinic on Friday for labs and possible IV Venofer and hydration and next week to see Dr. Tasia Catchings for additional treatment prn.   I spent 15 minutes dedicated to the care of this patient (face-to-face and non-face-to-face) on the date of the encounter to include what is described in the assessment and plan.  Faythe Casa, NP 03/28/2021 11:23 AM

## 2021-03-28 NOTE — Progress Notes (Signed)
Pt has no new concerns/complaints at this time 

## 2021-03-29 ENCOUNTER — Other Ambulatory Visit: Payer: Self-pay

## 2021-03-29 DIAGNOSIS — C8338 Diffuse large B-cell lymphoma, lymph nodes of multiple sites: Secondary | ICD-10-CM

## 2021-03-29 LAB — BPAM RBC
Blood Product Expiration Date: 202210182359
Blood Product Expiration Date: 202211042359
Blood Product Expiration Date: 202211042359
ISSUE DATE / TIME: 202210111816
ISSUE DATE / TIME: 202210112101
ISSUE DATE / TIME: 202210121057
Unit Type and Rh: 5100
Unit Type and Rh: 9500
Unit Type and Rh: 5100

## 2021-03-29 LAB — TYPE AND SCREEN
ABO/RH(D): O POS
ABO/RH(D): O POS
Antibody Screen: NEGATIVE
Antibody Screen: NEGATIVE
Unit division: 0
Unit division: 0
Unit division: 0

## 2021-03-30 ENCOUNTER — Other Ambulatory Visit: Payer: Self-pay

## 2021-03-30 ENCOUNTER — Inpatient Hospital Stay: Payer: BC Managed Care – PPO

## 2021-03-30 ENCOUNTER — Inpatient Hospital Stay (HOSPITAL_BASED_OUTPATIENT_CLINIC_OR_DEPARTMENT_OTHER): Payer: BC Managed Care – PPO | Admitting: Nurse Practitioner

## 2021-03-30 VITALS — BP 135/81 | HR 79 | Temp 98.2°F | Resp 17

## 2021-03-30 DIAGNOSIS — R112 Nausea with vomiting, unspecified: Secondary | ICD-10-CM

## 2021-03-30 DIAGNOSIS — C8338 Diffuse large B-cell lymphoma, lymph nodes of multiple sites: Secondary | ICD-10-CM | POA: Diagnosis not present

## 2021-03-30 DIAGNOSIS — Z5112 Encounter for antineoplastic immunotherapy: Secondary | ICD-10-CM | POA: Diagnosis not present

## 2021-03-30 DIAGNOSIS — D500 Iron deficiency anemia secondary to blood loss (chronic): Secondary | ICD-10-CM

## 2021-03-30 DIAGNOSIS — T451X5A Adverse effect of antineoplastic and immunosuppressive drugs, initial encounter: Secondary | ICD-10-CM | POA: Diagnosis not present

## 2021-03-30 DIAGNOSIS — D6481 Anemia due to antineoplastic chemotherapy: Secondary | ICD-10-CM | POA: Diagnosis not present

## 2021-03-30 DIAGNOSIS — R531 Weakness: Secondary | ICD-10-CM

## 2021-03-30 DIAGNOSIS — E871 Hypo-osmolality and hyponatremia: Secondary | ICD-10-CM

## 2021-03-30 DIAGNOSIS — G893 Neoplasm related pain (acute) (chronic): Secondary | ICD-10-CM

## 2021-03-30 DIAGNOSIS — D709 Neutropenia, unspecified: Secondary | ICD-10-CM | POA: Diagnosis not present

## 2021-03-30 LAB — CBC WITH DIFFERENTIAL/PLATELET
Abs Immature Granulocytes: 0.12 10*3/uL — ABNORMAL HIGH (ref 0.00–0.07)
Band Neutrophils: 6 %
Basophils Absolute: 0 10*3/uL (ref 0.0–0.1)
Basophils Relative: 0 %
Blasts: 0 %
Eosinophils Absolute: 0 10*3/uL (ref 0.0–0.5)
Eosinophils Relative: 0 %
HCT: 25 % — ABNORMAL LOW (ref 39.0–52.0)
Hemoglobin: 8 g/dL — ABNORMAL LOW (ref 13.0–17.0)
Lymphocytes Relative: 13 %
Lymphs Abs: 0.3 10*3/uL — ABNORMAL LOW (ref 0.7–4.0)
MCH: 25.2 pg — ABNORMAL LOW (ref 26.0–34.0)
MCHC: 32 g/dL (ref 30.0–36.0)
MCV: 78.6 fL — ABNORMAL LOW (ref 80.0–100.0)
Metamyelocytes Relative: 3 %
Monocytes Absolute: 0.3 10*3/uL (ref 0.1–1.0)
Monocytes Relative: 14 %
Myelocytes: 2 %
Neutro Abs: 1.6 10*3/uL — ABNORMAL LOW (ref 1.7–7.7)
Neutrophils Relative %: 62 %
Other: 0 %
Platelets: 157 10*3/uL (ref 150–400)
Promyelocytes Relative: 0 %
RBC: 3.18 MIL/uL — ABNORMAL LOW (ref 4.22–5.81)
RDW: 19.7 % — ABNORMAL HIGH (ref 11.5–15.5)
Smear Review: ADEQUATE
WBC: 2.3 10*3/uL — ABNORMAL LOW (ref 4.0–10.5)
nRBC: 0 /100 WBC
nRBC: 0.9 % — ABNORMAL HIGH (ref 0.0–0.2)

## 2021-03-30 LAB — COMPREHENSIVE METABOLIC PANEL
ALT: 24 U/L (ref 0–44)
AST: 21 U/L (ref 15–41)
Albumin: 2.6 g/dL — ABNORMAL LOW (ref 3.5–5.0)
Alkaline Phosphatase: 66 U/L (ref 38–126)
Anion gap: 10 (ref 5–15)
BUN: 13 mg/dL (ref 6–20)
CO2: 23 mmol/L (ref 22–32)
Calcium: 8.7 mg/dL — ABNORMAL LOW (ref 8.9–10.3)
Chloride: 95 mmol/L — ABNORMAL LOW (ref 98–111)
Creatinine, Ser: 1.2 mg/dL (ref 0.61–1.24)
GFR, Estimated: >60 mL/min (ref >60)
Glucose, Bld: 217 mg/dL — ABNORMAL HIGH (ref 70–99)
Potassium: 3.5 mmol/L (ref 3.5–5.1)
Sodium: 128 mmol/L — ABNORMAL LOW (ref 135–145)
Total Bilirubin: 0.3 mg/dL (ref 0.3–1.2)
Total Protein: 6.1 g/dL — ABNORMAL LOW (ref 6.5–8.1)

## 2021-03-30 LAB — SAMPLE TO BLOOD BANK

## 2021-03-30 MED ORDER — SODIUM CHLORIDE 0.9 % IV SOLN: 200.0000 mg | Freq: Once | INTRAVENOUS | Status: DC

## 2021-03-30 MED ORDER — SODIUM CHLORIDE 0.9% FLUSH
10.0000 mL | INTRAVENOUS | Status: DC | PRN
  Administered 2021-03-30: 10 mL via INTRAVENOUS
  Filled 2021-03-30: qty 10

## 2021-03-30 MED ORDER — HEPARIN SOD (PORK) LOCK FLUSH 100 UNIT/ML IV SOLN
500.0000 [IU] | Freq: Once | INTRAVENOUS | Status: AC
  Administered 2021-03-30: 500 [IU] via INTRAVENOUS
  Filled 2021-03-30: qty 5

## 2021-03-30 MED ORDER — SODIUM CHLORIDE 0.9 % IV SOLN
Freq: Once | INTRAVENOUS | Status: AC
  Filled 2021-03-30: qty 250

## 2021-03-30 MED ORDER — IRON SUCROSE 20 MG/ML IV SOLN
200.0000 mg | Freq: Once | INTRAVENOUS | Status: AC
  Administered 2021-03-30: 200 mg via INTRAVENOUS
  Filled 2021-03-30: qty 10

## 2021-03-30 NOTE — Patient Instructions (Signed)

## 2021-03-30 NOTE — Progress Notes (Signed)
Hematology/Oncology Follow Up Note [facility]  [phone] [phone]  Patient Care Team: Sharyne Peach, MD as PCP - General (Family Medicine) Earlie Server, MD as Consulting Physician (Hematology and Oncology)   Name of the patient: Zachary Perkins  [id number]  [birth year]   REASON FOR VISIT Follow-up for DLBCL  PERTINENT ONCOLOGY HISTORY -02/21/2021 EGD showed large fungating ulcerated noncircumferential mass with no bleeding and no stigmata of recent bleeding was found on the greater curvature of the stomach.  Biopsy was taken.  Large amount of necrotic and ulcerated appearing tissue.  Acute duodenitis.  Gastritis Awaiting for pathology report.  PET scan has been scheduled for outpatient. 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 Patient has also received PRBC transfusions as well as IV Venofer treatments daily x3 during hospitalization.  Today he reports feeling tired and fatigued.  Appetite is fair. Constipation, last bowel movement was yesterday.  Small amount.  He takes Senokot 1 tablet twice daily. Left flank pain, intermittent.  He takes pain medication.  # 02/26/2021 Bone marrow biopsy showed no bone marrow involvement. 03/09/2021, PET scan showed large ulcerated necrotic mass in direct communication with the gastric lumen, extending to splenic hilum, splenic parenchyma, also involving/and abutting the distal pancreas.  Likely extending into the splenorenal ligament and abutting the under surface of left hemi diagram.  Left adrenal nodularity and increased metabolic activity suspicious for adrenal metastasis. No increased metabolic activity about periportal and celiac lymph nodes.   INTERVAL HISTORY Patient is 60 year old male with above history of diffuse large B-cell lymphoma who returns to clinic for labs, further evaluation post chemotherapy.   Post cycle 1 his hemoglobin dropped and he was neutropenic.  He required blood transfusion. He received IV venofer on 03/23/21. On 03/28/21, hemoglobin improved to 7.1, ANC 0. He received 1 unit of PRBCs and was started on Cipro.  He received Udenyca support with his treatment. Today, he feels well. SOB and energy has improved. No bleeding or infections.    Review of Systems  Constitutional:  Positive for fatigue. Negative for appetite change, chills, fever and unexpected weight change.  HENT:   Negative for mouth sores, sore throat and trouble swallowing.   Respiratory:  Negative for chest tightness and shortness of breath.   Cardiovascular:  Negative for leg swelling.  Gastrointestinal:  Negative for abdominal pain, constipation, diarrhea, nausea and vomiting.  Genitourinary:  Negative for bladder incontinence, dysuria and hematuria.   Musculoskeletal:  Negative for flank pain and neck stiffness.  Skin:  Negative for itching, rash and wound.  Neurological:  Negative for dizziness, headaches, light-headedness and numbness.  Hematological:  Negative for adenopathy. Does not bruise/bleed easily.  Psychiatric/Behavioral:  Negative for confusion, depression and sleep disturbance. The patient is not nervous/anxious.      Allergies  Allergen Reactions   Lisinopril Cough   Losartan Diarrhea   Metformin And Related     PASSED OUT     Past Medical History:  Diagnosis Date   Diabetes mellitus without complication ([place])    Diffuse large B cell lymphoma ([place])    Hypertension    Hypothyroidism    Morbid obesity ([place])      Past Surgical History:  Procedure Laterality Date   CARPAL TUNNEL RELEASE Left    ESOPHAGOGASTRODUODENOSCOPY (EGD) WITH PROPOFOL N/A 02/21/2021   Procedure: ESOPHAGOGASTRODUODENOSCOPY (EGD) WITH PROPOFOL;  Surgeon: Annamaria Helling, DO;  Location: [facility];  Service: Gastroenterology;  Laterality: N/A;  after 2pm   INSERTION OF MESH N/A 05/02/2017    Procedure: INSERTION OF MESH;  Surgeon: Leonie Green, MD;  Location: [facility];  Service: General;  Laterality: N/A;   lasix eye surgery     PORTACATH PLACEMENT Right 03/09/2021   Procedure: INSERTION PORT-A-CATH;  Surgeon: Jules Husbands, MD;  Location: [facility];  Service: General;  Laterality: Right;   UMBILICAL HERNIA REPAIR N/A 05/02/2017   Procedure: HERNIA REPAIR UMBILICAL ADULT;  Surgeon: Leonie Green, MD;  Location: [facility];  Service: General;  Laterality: N/A;    Social History   Socioeconomic History   Marital status: Married    Spouse name: Melonie   Number of children: 0   Years of education: 12   Highest education level: 12th grade  Occupational History   Not on file  Tobacco Use   Smoking status: Former    Packs/day: 1.00    Years: 20.00    Pack years: 20.00    Types: Cigarettes    Quit date: 04/25/2007    Years since quitting: 13.9   Smokeless tobacco: Never  Vaping Use   Vaping Use: Never used  Substance and Sexual Activity   Alcohol use: Not Currently    Comment: rare   Drug use: No   Sexual activity: Yes  Other Topics Concern   Not on file  Social History Narrative   Not on file   Social Determinants of Health   Financial Resource Strain: Not on file  Food Insecurity: Not on file  Transportation Needs: Not on file  Physical Activity: Not on file  Stress: Not on file  Social Connections: Not on file  Intimate Partner Violence: Not on file    Family History  Problem Relation Age of Onset   COPD Father    Emphysema Father    Cancer - Lung Brother      Current Outpatient Medications:    allopurinol (ZYLOPRIM) 100 MG tablet, Take 1 tablet (100 mg total) by mouth daily., Disp: 30 tablet, Rfl: 0   bisacodyl (DULCOLAX) 10 MG suppository, Place 1 suppository (10 mg total) rectally daily as needed for severe constipation., Disp: 12 suppository, Rfl: 0   ciprofloxacin (CIPRO) 500 MG tablet, Take 1 tablet (500 mg total) by mouth daily  for 4 days., Disp: 4 tablet, Rfl: 0   docusate sodium (COLACE) 100 MG capsule, Take 200 mg by mouth daily., Disp: , Rfl:    fentaNYL (DURAGESIC) 25 MCG/HR, Place 1 patch onto the skin every 3 (three) days. (Patient not taking: No sig reported), Disp: 5 patch, Rfl: 0   hydrochlorothiazide (HYDRODIURIL) 25 MG tablet, Take 25 mg by mouth daily., Disp: , Rfl:    levothyroxine (SYNTHROID) 300 MCG tablet, Take 300 mcg by mouth daily., Disp: , Rfl:    lidocaine-prilocaine (EMLA) cream, Apply to affected area once, Disp: 30 g, Rfl: 3   loratadine (CLARITIN) 10 MG tablet, Take 10 mg by mouth daily. (Patient not taking: No sig reported), Disp: , Rfl:    Multiple Vitamin (MULTI-VITAMIN) tablet, Take 1 tablet by mouth daily., Disp: , Rfl:    ondansetron (ZOFRAN) 8 MG tablet, Take 1 tablet (8 mg total) by mouth every 8 (eight) hours as needed for nausea or vomiting. (Patient not taking: No sig reported), Disp: 90 tablet, Rfl: 1   oxyCODONE (OXY IR/ROXICODONE) 5 MG immediate release tablet, Take 1 tablet (5 mg total) by mouth every 4 (four) hours as needed for  moderate pain., Disp: 60 tablet, Rfl: 0   pantoprazole (PROTONIX) 40 MG tablet, Take 1 tablet (40 mg total) by mouth 2 (two) times daily before a meal., Disp: 180 tablet, Rfl: 0   polyethylene glycol (MIRALAX / GLYCOLAX) 17 g packet, Take 17 g by mouth daily as needed for moderate constipation. (Patient taking differently: Take 17 g by mouth daily.), Disp: 14 each, Rfl: 0   potassium chloride (KLOR-CON) 10 MEQ tablet, Take 10 mEq by mouth daily., Disp: , Rfl:    predniSONE (DELTASONE) 20 MG tablet, Take 5 tablets (100 mg total) by mouth daily. Take with food on days 2-5 of chemotherapy. (Patient not taking: No sig reported), Disp: 25 tablet, Rfl: 5   prochlorperazine (COMPAZINE) 10 MG tablet, Take 1 tablet (10 mg total) by mouth every 6 (six) hours as needed for nausea or vomiting., Disp: 90 tablet, Rfl: 1   promethazine (PHENERGAN) 25 MG suppository, Place 1  suppository (25 mg total) rectally every 6 (six) hours as needed for nausea or vomiting. (Patient not taking: No sig reported), Disp: 24 each, Rfl: 0 No current facility-administered medications for this visit.  Facility-Administered Medications Ordered in Other Visits:    heparin lock flush 100 unit/mL, 500 Units, Intravenous, Once, Verlon Au, NP   sodium chloride flush (NS) 0.9 % injection 10 mL, 10 mL, Intravenous, PRN, Verlon Au, NP, 10 mL at 03/30/21 0905  Physical exam:  Vitals:   03/30/21 0922  BP: 135/81  Pulse: 79  Resp: 17  Temp: 98.2 F (36.8 C)  SpO2: 99%    Physical Exam Constitutional:      General: He is not in acute distress.    Appearance: He is well-developed.  HENT:     Head: Normocephalic and atraumatic.     Nose: Nose normal.     Mouth/Throat:     Pharynx: No oropharyngeal exudate.  Eyes:     General: No scleral icterus.    Conjunctiva/sclera: Conjunctivae normal.  Cardiovascular:     Rate and Rhythm: Normal rate and regular rhythm.     Heart sounds: Normal heart sounds.  Pulmonary:     Effort: Pulmonary effort is normal.     Breath sounds: Normal breath sounds. No wheezing.  Abdominal:     General: There is no distension.     Palpations: Abdomen is soft.     Tenderness: There is no abdominal tenderness.  Musculoskeletal:        General: Normal range of motion.     Cervical back: Normal range of motion and neck supple.  Skin:    General: Skin is warm and dry.     Coloration: Skin is pale.  Neurological:     Mental Status: He is alert and oriented to person, place, and time.  Psychiatric:        Mood and Affect: Mood normal.        Behavior: Behavior normal.    CMP Latest Ref Rng & Units 03/30/2021  Glucose 70 - 99 mg/dL 217(H)  BUN 6 - 20 mg/dL 13  Creatinine 0.61 - 1.24 mg/dL 1.20  Sodium 135 - 145 mmol/L 128(L)  Potassium 3.5 - 5.1 mmol/L 3.5  Chloride 98 - 111 mmol/L 95(L)  CO2 22 - 32 mmol/L 23  Calcium 8.9 - 10.3 mg/dL  8.7(L)  Total Protein 6.5 - 8.1 g/dL 6.1(L)  Total Bilirubin 0.3 - 1.2 mg/dL 0.3  Alkaline Phos 38 - 126 U/L 66  AST 15 - 41 U/L 21  ALT 0 - 44 U/L 24   CBC Latest Ref Rng & Units 03/30/2021  WBC 4.0 - 10.5 K/uL 2.3(L)  Hemoglobin 13.0 - 17.0 g/dL 8.0(L)  Hematocrit 39.0 - 52.0 % 25.0(L)  Platelets 150 - 400 K/uL 157    RADIOGRAPHIC STUDIES: I have personally reviewed the radiological images as listed and agreed with the findings in the report. NM PET Image Initial (PI) Skull Base To Thigh  Result Date: 03/09/2021 CLINICAL DATA:  Initial treatment strategy for gastric mass. EXAM: NUCLEAR MEDICINE PET SKULL BASE TO THIGH TECHNIQUE: 14.6 mCi F-18 FDG was injected intravenously. Full-ring PET imaging was performed from the skull base to thigh after the radiotracer. CT data was obtained and used for attenuation correction and anatomic localization. Fasting blood glucose: 110 mg/dl COMPARISON:  February 20, 2021 CT of the abdomen and pelvis. FINDINGS: Mediastinal blood pool activity: SUV max 2.50 Liver activity: SUV max NA NECK: No hypermetabolic lymph nodes in the neck. Incidental CT findings: none CHEST: No hypermetabolic mediastinal or hilar nodes. No suspicious pulmonary nodules on the CT scan. Incidental CT findings: Minimal aortic atherosclerosis, signs of coronary artery disease and aortic calcification. Mild cardiac enlargement. Small LEFT-sided pleural effusion. No adenopathy by size criteria in the chest. Airways are patent. ABDOMEN/PELVIS: Large mass between stomach and spleen extending along gastrosplenic ligament and invading the splenic hilum and splenic parenchyma shows central necrosis and gas with communication with the gastric lumen. Mass is markedly hypermetabolic with a maximum SUV of 33.47. LEFT adrenal gland with thickening up to 13 mm in the axial plane and an area of increased metabolic activity (image 154/0) 4.65 maximum SUV. Periportal lymph nodes of concern on the previous CT  without uptake beyond background liver. Largest celiac node without uptake above blood pool. No signs of adenopathy or solid organ metastases elsewhere Incidental CT findings: No pericholecystic stranding. Liver with smooth contours. Pancreatic tail is intimately associated with mass in the gastrosplenic ligament which also extends into the splenorenal ligament. RIGHT adrenal gland is normal. No hydronephrosis. LEFT kidney is separate from the mass which appears to extend along the LEFT anterior renal fascia and other planes as discussed. Mass in direct communication with the stomach. No acute gastrointestinal process with signs of sigmoid diverticulosis and diverticular disease. Aortic atherosclerosis without aneurysm. Prostate unremarkable by CT. SKELETON: No focal hypermetabolic activity to suggest skeletal metastasis. Incidental CT findings: none IMPRESSION: Large ulcerated necrotic mass and direct communication with the gastric lumen, extending into splenic hilum and splenic parenchyma, also involving or abutting the distal pancreas, likely extending into the splenorenal ligament and abutting the under surface of the LEFT hemidiaphragm. This mass shows marked hypermetabolic features, LEFT adrenal nodularity and increased metabolic activity is suspicious for LEFT adrenal metastasis with a maximum SUV that is greater than liver activity. No increased metabolic activity about periportal and celiac lymph nodes. Electronically Signed   By: Zetta Bills M.D.   On: 03/09/2021 11:56   DG Chest Portable 1 View  Result Date: 03/27/2021 CLINICAL DATA:  Shortness of breath, anemia and neutropenia EXAM: PORTABLE CHEST 1 VIEW COMPARISON:  Chest radiograph 03/10/2019 FINDINGS: There is a right chest wall port in place with tip in the mid SVC. The cardiomediastinal silhouette is within normal limits There are patchy opacities in the lateral left base. There is no other focal consolidation. There is no pulmonary edema.  There is no pleural effusion or pneumothorax. There is no acute osseous abnormality. IMPRESSION: Patchy opacities in the lateral left base  are favored to reflect atelectasis; however, infection cannot be excluded. Electronically Signed   By: Valetta Mole M.D.   On: 03/27/2021 15:34   DG Chest Port 1 View  Result Date: 03/09/2021 CLINICAL DATA:  Status post Port-A-Cath placement in a 60 year old male. EXAM: PORTABLE CHEST 1 VIEW COMPARISON:  May 19, 2018 and PET exam from September of 2022. FINDINGS: RIGHT-sided Port-A-Cath enters via IJ approach. This terminates in the mid to distal superior vena cava. Mild rotation to the RIGHT. Accounting for this cardiomediastinal contours and hilar structures are normal. Minimal LEFT basilar airspace disease and potential LEFT pleural effusion. No pneumothorax, RIGHT costodiaphragmatic sulcus is excluded from view. On limited assessment there is no acute skeletal process. IMPRESSION: Port-A-Cath tip in the mid to distal superior vena cava, central venous placement. No visible pneumothorax. RIGHT costodiaphragmatic sulcus is excluded from view on this semi erect radiograph. Correlate with symptoms to determine whether repeat radiograph may be warranted. Minimal LEFT basilar airspace disease and potential LEFT pleural effusion. Electronically Signed   By: Zetta Bills M.D.   On: 03/09/2021 11:31   DG C-Arm 1-60 Min-No Report  Result Date: 03/09/2021 Fluoroscopy was utilized by the requesting physician.  No radiographic interpretation.   ECHOCARDIOGRAM COMPLETE  Result Date: 03/12/2021    ECHOCARDIOGRAM REPORT   Patient Name:   [facility] Date of Exam: 03/12/2021 Medical Rec #:  [id number]       Height:       76.0 in Accession #:    [id number]      Weight:       270.0 lb Date of Birth:  February 16, 1961      BSA:          2.517 m Patient Age:    63 years        BP:           144/90 mmHg Patient Gender: M               HR:           57 bpm. Exam Location:  [facility]  Procedure: 2D Echo, Cardiac Doppler, Color Doppler and Strain Analysis Indications:     Chemo Z09  History:         Patient has no prior history of Echocardiogram examinations.                  Risk Factors:Hypertension and Diabetes.  Sonographer:     Sherrie Sport Referring Phys:  [id number] Wall Diagnosing Phys: Kathlyn Sacramento MD  Sonographer Comments: Suboptimal apical window. Global longitudinal strain was attempted. IMPRESSIONS  1. Left ventricular ejection fraction, by estimation, is 55 to 60%. The left ventricle has normal function. The left ventricle has no regional wall motion abnormalities. There is moderate left ventricular hypertrophy. Left ventricular diastolic parameters are consistent with Grade I diastolic dysfunction (impaired relaxation).  2. Right ventricular systolic function is normal. The right ventricular size is normal. There is normal pulmonary artery systolic pressure.  3. Left atrial size was mildly dilated.  4. Right atrial size was mildly dilated.  5. The mitral valve is normal in structure. No evidence of mitral valve regurgitation. No evidence of mitral stenosis.  6. The aortic valve is normal in structure. Aortic valve regurgitation is not visualized. Mild aortic valve sclerosis is present, with no evidence of aortic valve stenosis.  7. Aortic dilatation noted. There is mild dilatation of the aortic root, measuring 40 mm. FINDINGS  Left Ventricle: Left ventricular ejection  fraction, by estimation, is 55 to 60%. The left ventricle has normal function. The left ventricle has no regional wall motion abnormalities. Global longitudinal strain performed but not reported based on interpreter judgement due to suboptimal tracking. The left ventricular internal cavity size was normal in size. There is moderate left ventricular hypertrophy. Left ventricular diastolic parameters are consistent with Grade I diastolic dysfunction (impaired relaxation). Right Ventricle: The right ventricular size is  normal. No increase in right ventricular wall thickness. Right ventricular systolic function is normal. There is normal pulmonary artery systolic pressure. The tricuspid regurgitant velocity is 2.73 m/s, and  with an assumed right atrial pressure of 5 mmHg, the estimated right ventricular systolic pressure is 19.3 mmHg. Left Atrium: Left atrial size was mildly dilated. Right Atrium: Right atrial size was mildly dilated. Pericardium: There is no evidence of pericardial effusion. Mitral Valve: The mitral valve is normal in structure. No evidence of mitral valve regurgitation. No evidence of mitral valve stenosis. Tricuspid Valve: The tricuspid valve is normal in structure. Tricuspid valve regurgitation is mild . No evidence of tricuspid stenosis. Aortic Valve: The aortic valve is normal in structure. Aortic valve regurgitation is not visualized. Mild aortic valve sclerosis is present, with no evidence of aortic valve stenosis. Aortic valve mean gradient measures 2.0 mmHg. Aortic valve peak gradient measures 4.5 mmHg. Aortic valve area, by VTI measures 5.20 cm. Pulmonic Valve: The pulmonic valve was normal in structure. Pulmonic valve regurgitation is not visualized. No evidence of pulmonic stenosis. Aorta: Aortic dilatation noted. There is mild dilatation of the aortic root, measuring 40 mm. Venous: The inferior vena cava was not well visualized. IAS/Shunts: No atrial level shunt detected by color flow Doppler.  LEFT VENTRICLE PLAX 2D LVIDd:         4.90 cm  Diastology LVIDs:         3.30 cm  LV e' medial:    4.46 cm/s LV PW:         1.90 cm  LV E/e' medial:  10.8 LV IVS:        1.60 cm  LV e' lateral:   7.29 cm/s LVOT diam:     2.30 cm  LV E/e' lateral: 6.6 LV SV:         79 LV SV Index:   32 LVOT Area:     4.15 cm  LEFT ATRIUM             Index       RIGHT ATRIUM           Index LA diam:        3.80 cm 1.51 cm/m  RA Area:     22.90 cm LA Vol (A2C):   89.3 ml 35.48 ml/m RA Volume:   65.90 ml  26.19 ml/m LA Vol  (A4C):   82.4 ml 32.74 ml/m LA Biplane Vol: 89.9 ml 35.72 ml/m  AORTIC VALVE                   PULMONIC VALVE AV Area (Vmax):    4.10 cm    PV Vmax:        0.51 m/s AV Area (Vmean):   4.30 cm    PV Peak grad:   1.0 mmHg AV Area (VTI):     5.20 cm    RVOT Peak grad: 4 mmHg AV Vmax:           106.50 cm/s AV Vmean:  69.350 cm/s AV VTI:            0.153 m AV Peak Grad:      4.5 mmHg AV Mean Grad:      2.0 mmHg LVOT Vmax:         105.00 cm/s LVOT Vmean:        71.700 cm/s LVOT VTI:          0.191 m LVOT/AV VTI ratio: 1.25  AORTA Ao Root diam: 3.93 cm MITRAL VALVE               TRICUSPID VALVE MV Area (PHT): 4.68 cm    TR Peak grad:   29.8 mmHg MV Decel Time: 162 msec    TR Vmax:        273.00 cm/s MV E velocity: 48.00 cm/s MV A velocity: 73.30 cm/s  SHUNTS MV E/A ratio:  0.65        Systemic VTI:  0.19 m                            Systemic Diam: 2.30 cm Kathlyn Sacramento MD Electronically signed by Kathlyn Sacramento MD Signature Date/Time: 03/12/2021/11:05:50 AM    Final    DG FLUORO GUIDED LOC OF NEEDLE/CATH TIP FOR SPINAL INJECT LT  Result Date: 03/21/2021 CLINICAL DATA:  Patient with diffuse large B-cell lymphoma request received for diagnostic and therapeutic lumbar puncture. EXAM: FLUOROSCOPICALLY GUIDED LUMBAR PUNCTURE FOR INTRATHECAL CHEMOTHERAPY AND DIAGNOSTIC LUMBAR PUNCTURE TECHNIQUE: Informed consent was obtained from the patient prior to the procedure, including potential complications of CSF leak requiring additional procedure, paresthesias, bleeding, infection, side effects from medication, seizures, headache, allergy, and pain. A 'time out' was performed. With the patient prone, the lower back was prepped with Betadine. 1% Lidocaine was used for local anesthesia. Lumbar puncture was performed at the L4-L5 using a 20 gauge needle with return of clear CSF. A total of 9 mL of CSF fluid was obtained and sent to the laboratory for analysis. Opening pressure 15 cm of H2O. 12 mg of methotrexate was  injected into the subarachnoid space. The patient tolerated the procedure well without apparent complication. A sterile dressing was applied. FLUOROSCOPY TIME:  0.2 minute, 2 images IMPRESSION: Technically successful fluoroscopic guided lumbar puncture. Intrathecal injection of chemotherapy without complication. Read By: Tsosie Billing PA-C Electronically Signed   By: Kathreen Devoid M.D.   On: 03/21/2021 13:03     Assessment and plan  No diagnosis found. Cancer Staging DLBCL (diffuse large B cell lymphoma) ([place]) Staging form: Hodgkin and Non-Hodgkin Lymphoma, AJCC 8th Edition - Clinical stage from 03/13/2021: Stage IV (Diffuse large B-cell lymphoma) - Signed by Earlie Server, MD on 03/15/2021  Diffuse large B-cell lymphoma- s/p cycle 1 of R-CHOP chemotherapy with intrathecal methotrexate. He continues allopurinol for tumor lysis prophylaxis.  Cycle 1 complicated by severe anemia with hemoglobin of 5.6, neutropenia with ANC of 0.  Mild thrombocytopenia.  Chemo induced Anemia- s/p pRBC x 3. Hemoglobin improved to 8.0. Proceed with venofer today.   Neutropenia- s/p udenyca. S/p cipro x 5 days for neutropenic prophylaxis. ANC improved to 1.6.   Nausea/vomiting- Continue Zofran and Compazine. Hold phenergan suppositories in setting of neutropenia.  If problematic, could consider rotating to ODT Zofran or adding oral Phenergan or olanzapine.  Malignancy related pain- Continue oxycodone 5 mg every 4 hours as needed.  Hypercalcemia- S/p 1 dose Zometa. Calcium level has improved. Monitor.   Hyponatremia- IV fluids today.  Add electrolyte drinks    Disposition- Follow-up with Dr. Tasia Catchings as scheduled on 04/03/2021   Beckey Rutter, [place], AGNP-C [place] [facility] [phone] (clinic) 03/30/2021

## 2021-04-01 ENCOUNTER — Encounter: Payer: Self-pay | Admitting: Oncology

## 2021-04-02 ENCOUNTER — Other Ambulatory Visit: Payer: Self-pay | Admitting: Oncology

## 2021-04-02 MED ORDER — LIDOCAINE (ANORECTAL) 5 % EX CREA: 1.0000 g | TOPICAL_CREAM | Freq: Three times a day (TID) | CUTANEOUS | 0 refills | Status: AC | PRN

## 2021-04-03 ENCOUNTER — Inpatient Hospital Stay: Payer: BC Managed Care – PPO

## 2021-04-03 ENCOUNTER — Encounter: Payer: Self-pay | Admitting: Oncology

## 2021-04-03 ENCOUNTER — Ambulatory Visit
Admission: RE | Admit: 2021-04-03 | Discharge: 2021-04-03 | Disposition: A | Discharge status: Home / Self Care | Payer: BC Managed Care – PPO | Source: Ambulatory Visit | Attending: Oncology | Admitting: Oncology

## 2021-04-03 ENCOUNTER — Other Ambulatory Visit: Payer: Self-pay

## 2021-04-03 ENCOUNTER — Inpatient Hospital Stay (HOSPITAL_BASED_OUTPATIENT_CLINIC_OR_DEPARTMENT_OTHER): Payer: BC Managed Care – PPO | Admitting: Oncology

## 2021-04-03 ENCOUNTER — Other Ambulatory Visit: Payer: Self-pay | Admitting: Oncology

## 2021-04-03 VITALS — BP 141/90 | HR 68 | Temp 96.0°F | Resp 18

## 2021-04-03 VITALS — BP 131/92 | HR 76 | Temp 97.7°F | Resp 18 | Wt 265.6 lb

## 2021-04-03 DIAGNOSIS — D649 Anemia, unspecified: Secondary | ICD-10-CM

## 2021-04-03 DIAGNOSIS — M7989 Other specified soft tissue disorders: Secondary | ICD-10-CM | POA: Diagnosis not present

## 2021-04-03 DIAGNOSIS — T451X5A Adverse effect of antineoplastic and immunosuppressive drugs, initial encounter: Secondary | ICD-10-CM

## 2021-04-03 DIAGNOSIS — D500 Iron deficiency anemia secondary to blood loss (chronic): Secondary | ICD-10-CM | POA: Diagnosis not present

## 2021-04-03 DIAGNOSIS — Z5112 Encounter for antineoplastic immunotherapy: Secondary | ICD-10-CM | POA: Diagnosis not present

## 2021-04-03 DIAGNOSIS — D701 Agranulocytosis secondary to cancer chemotherapy: Secondary | ICD-10-CM

## 2021-04-03 DIAGNOSIS — C8338 Diffuse large B-cell lymphoma, lymph nodes of multiple sites: Secondary | ICD-10-CM

## 2021-04-03 DIAGNOSIS — G893 Neoplasm related pain (acute) (chronic): Secondary | ICD-10-CM

## 2021-04-03 DIAGNOSIS — D6481 Anemia due to antineoplastic chemotherapy: Secondary | ICD-10-CM | POA: Diagnosis not present

## 2021-04-03 LAB — COMPREHENSIVE METABOLIC PANEL
ALT: 92 U/L — ABNORMAL HIGH (ref 0–44)
AST: 44 U/L — ABNORMAL HIGH (ref 15–41)
Albumin: 2.8 g/dL — ABNORMAL LOW (ref 3.5–5.0)
Alkaline Phosphatase: 97 U/L (ref 38–126)
Anion gap: 8 (ref 5–15)
BUN: 14 mg/dL (ref 6–20)
CO2: 24 mmol/L (ref 22–32)
Calcium: 8.4 mg/dL — ABNORMAL LOW (ref 8.9–10.3)
Chloride: 101 mmol/L (ref 98–111)
Creatinine, Ser: 1.08 mg/dL (ref 0.61–1.24)
GFR, Estimated: >60 mL/min (ref >60)
Glucose, Bld: 206 mg/dL — ABNORMAL HIGH (ref 70–99)
Potassium: 3.6 mmol/L (ref 3.5–5.1)
Sodium: 133 mmol/L — ABNORMAL LOW (ref 135–145)
Total Bilirubin: <0.1 mg/dL — ABNORMAL LOW (ref 0.3–1.2)
Total Protein: 6.5 g/dL (ref 6.5–8.1)

## 2021-04-03 LAB — CBC WITH DIFFERENTIAL/PLATELET
Abs Immature Granulocytes: 0.54 10*3/uL — ABNORMAL HIGH (ref 0.00–0.07)
Basophils Absolute: 0.1 10*3/uL (ref 0.0–0.1)
Basophils Relative: 1 %
Eosinophils Absolute: 0 10*3/uL (ref 0.0–0.5)
Eosinophils Relative: 0 %
HCT: 23.6 % — ABNORMAL LOW (ref 39.0–52.0)
Hemoglobin: 7.5 g/dL — ABNORMAL LOW (ref 13.0–17.0)
Immature Granulocytes: 6 %
Lymphocytes Relative: 7 %
Lymphs Abs: 0.7 10*3/uL (ref 0.7–4.0)
MCH: 25.2 pg — ABNORMAL LOW (ref 26.0–34.0)
MCHC: 31.8 g/dL (ref 30.0–36.0)
MCV: 79.2 fL — ABNORMAL LOW (ref 80.0–100.0)
Monocytes Absolute: 0.5 10*3/uL (ref 0.1–1.0)
Monocytes Relative: 5 %
Neutro Abs: 7.7 10*3/uL (ref 1.7–7.7)
Neutrophils Relative %: 81 %
Platelets: 264 10*3/uL (ref 150–400)
RBC: 2.98 MIL/uL — ABNORMAL LOW (ref 4.22–5.81)
RDW: 20.9 % — ABNORMAL HIGH (ref 11.5–15.5)
Smear Review: NORMAL
WBC: 9.5 10*3/uL (ref 4.0–10.5)
nRBC: 0 % (ref 0.0–0.2)

## 2021-04-03 LAB — PREPARE RBC (CROSSMATCH)

## 2021-04-03 MED ORDER — ACETAMINOPHEN 325 MG PO TABS
650.0000 mg | ORAL_TABLET | Freq: Once | ORAL | Status: AC
Start: 2021-04-03 — End: 2021-04-03
  Administered 2021-04-03: 650 mg via ORAL
  Filled 2021-04-03: qty 2

## 2021-04-03 MED ORDER — HEPARIN SOD (PORK) LOCK FLUSH 100 UNIT/ML IV SOLN
500.0000 [IU] | Freq: Every day | INTRAVENOUS | Status: AC | PRN
  Filled 2021-04-03: qty 5

## 2021-04-03 MED ORDER — DIPHENHYDRAMINE HCL 25 MG PO CAPS
25.0000 mg | ORAL_CAPSULE | Freq: Once | ORAL | Status: AC
  Administered 2021-04-03: 25 mg via ORAL
  Filled 2021-04-03: qty 1

## 2021-04-03 MED ORDER — HEPARIN SOD (PORK) LOCK FLUSH 100 UNIT/ML IV SOLN
INTRAVENOUS | Status: AC
  Administered 2021-04-03: 500 [IU]
  Filled 2021-04-03: qty 5

## 2021-04-03 MED ORDER — SODIUM CHLORIDE 0.9% IV SOLUTION
250.0000 mL | Freq: Once | INTRAVENOUS | Status: AC
  Administered 2021-04-03: 250 mL via INTRAVENOUS
  Filled 2021-04-03: qty 250

## 2021-04-03 MED ORDER — SODIUM CHLORIDE 0.9 % IV SOLN
Freq: Once | INTRAVENOUS | Status: AC
  Filled 2021-04-03: qty 250

## 2021-04-03 MED ORDER — GABAPENTIN 100 MG PO CAPS: 100.0000 mg | ORAL_CAPSULE | Freq: Three times a day (TID) | ORAL | 2 refills | Status: AC

## 2021-04-03 NOTE — Progress Notes (Signed)
Rapid Infusion Rituximab Pharmacist Evaluation  Zachary Perkins is a 60 y.o. male being treated with rituximab for non hodgkins lymphoma. This patient may be considered for RIR.   A pharmacist has verified the patient tolerated rituximab infusions per the [facility] standard infusion protocol without grade 3-4 infusion reactions. The treatment plan will be updated to reflect RIR if the patient qualifies per the checklist below:   Age > 26 years old Yes   Clinically significant cardiovascular disease No   Circulating lymphocyte count < 5000/uL prior to cycle two Yes  Lab Results  Component Value Date   LYMPHSABS 0.7 04/03/2021    Prior documented grade 3-4 infusion reaction to rituximab No   Prior documented grade 1-2 infusion reaction to rituximab (If YES, Pharmacist will confirm with Physician if patient is still a candidate for RIR) No   Previous rituximab infusion within the past 6 months Yes   Treatment Plan updated orders to reflect RIR Yes    [name] does meet the criteria for Rapid Infusion Rituximab. This patient is going to be switched to rapid infusion rituximab.   [name] 04/03/21 12:48 PM

## 2021-04-03 NOTE — Progress Notes (Signed)
Pt here for follow up. He reports that he is having tingling to hands. He has had tingling to feet, but it seem to have gotten a little worse. He had an IV placed to left forearm about a month ago and he reports it is sore and swollen

## 2021-04-03 NOTE — Progress Notes (Signed)
Hematology/Oncology Follow Up Note [facility]  Telephone:([phone] [phone]  Patient Care Team: Sharyne Peach, MD as PCP - General (Family Medicine) Earlie Server, MD as Consulting Physician (Hematology and Oncology)   Name of the patient: Zachary Perkins  [id number]  07/29/1960   REASON FOR VISIT Follow-up for DLBCL  PERTINENT ONCOLOGY HISTORY -02/21/2021 EGD showed large fungating ulcerated noncircumferential mass with no bleeding and no stigmata of recent bleeding was found on the greater curvature of the stomach.  Biopsy was taken.  Large amount of necrotic and ulcerated appearing tissue.  Acute duodenitis.  Gastritis Awaiting for pathology report.  PET scan has been scheduled for outpatient. 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 Patient has also received PRBC transfusions as well as IV Venofer treatments daily x3 during hospitalization.  Today he reports feeling tired and fatigued.  Appetite is fair. Constipation, last bowel movement was yesterday.  Small amount.  He takes Senokot 1 tablet twice daily. Left flank pain, intermittent.  He takes pain medication.  # 02/26/2021 Bone marrow biopsy showed no bone marrow involvement. 03/09/2021, PET scan showed large ulcerated necrotic mass in direct communication with the gastric lumen, extending to splenic hilum, splenic parenchyma, also involving/and abutting the distal pancreas.  Likely extending into the splenorenal ligament and abutting the under surface of left hemi diagram.  Left adrenal nodularity and increased metabolic activity suspicious for adrenal metastasis. No increased metabolic activity about periportal and celiac lymph nodes.  INTERVAL HISTORY Dejaun Vidrio is a 60 y.o. male who has above history reviewed by me today presents for follow up for diffuse large B-cell lymphoma. 03/20/2021, status  post cycle 1 R-CHOP intrathecal MTX with GCSF support.  Patient has required 3 PRBC transfusion last week due to cytopenia/symptomatic anemia. Today he reports feeling better.  Still fatigued. Appetite is better on some days and report on the other days. No nausea vomiting. Patient has a anal fissure which is irritating/painful. He has also reports bilateral fingertip numbness and tingling.  Constant. Left forearm swelling and pain.  He previously had IV on the same arm during his hospitalization. Patient takes oxycodone as needed.  Pain is well controlled.  He on average uses oxycodone 1-2 times per day. No fever, chills, his weight has remained stable.  Review of Systems  Constitutional:  Positive for appetite change and fatigue. Negative for chills, fever and unexpected weight change.  HENT:   Negative for hearing loss and voice change.   Eyes:  Negative for eye problems and icterus.  Respiratory:  Positive for shortness of breath. Negative for chest tightness and cough.   Cardiovascular:  Negative for chest pain and leg swelling.  Gastrointestinal:  Positive for nausea. Negative for abdominal distention, abdominal pain, constipation and vomiting.  Endocrine: Negative for hot flashes.  Genitourinary:  Negative for difficulty urinating, dysuria and frequency.   Musculoskeletal:  Negative for arthralgias.       Left forearm swelling and pain  Skin:  Negative for itching and rash.  Neurological:  Positive for numbness. Negative for light-headedness.  Hematological:  Negative for adenopathy. Does not bruise/bleed easily.  Psychiatric/Behavioral:  Negative for confusion.      Allergies  Allergen Reactions   Lisinopril Cough   Losartan Diarrhea   Metformin And Related     PASSED OUT     Past Medical History:  Diagnosis Date   Diabetes mellitus without  complication ([place])    Diffuse large B cell lymphoma ([place])    Hypertension    Hypothyroidism    Morbid obesity ([place])       Past Surgical History:  Procedure Laterality Date   CARPAL TUNNEL RELEASE Left    ESOPHAGOGASTRODUODENOSCOPY (EGD) WITH PROPOFOL N/A 02/21/2021   Procedure: ESOPHAGOGASTRODUODENOSCOPY (EGD) WITH PROPOFOL;  Surgeon: Annamaria Helling, DO;  Location: [facility] ENDOSCOPY;  Service: Gastroenterology;  Laterality: N/A;  after 2pm   INSERTION OF MESH N/A 05/02/2017   Procedure: INSERTION OF MESH;  Surgeon: Leonie Green, MD;  Location: [facility] ORS;  Service: General;  Laterality: N/A;   lasix eye surgery     PORTACATH PLACEMENT Right 03/09/2021   Procedure: INSERTION PORT-A-CATH;  Surgeon: Jules Husbands, MD;  Location: [facility] ORS;  Service: General;  Laterality: Right;   UMBILICAL HERNIA REPAIR N/A 05/02/2017   Procedure: HERNIA REPAIR UMBILICAL ADULT;  Surgeon: Leonie Green, MD;  Location: [facility] ORS;  Service: General;  Laterality: N/A;    Social History   Socioeconomic History   Marital status: Married    Spouse name: Melonie   Number of children: 0   Years of education: 12   Highest education level: 12th grade  Occupational History   Not on file  Tobacco Use   Smoking status: Former    Packs/day: 1.00    Years: 20.00    Pack years: 20.00    Types: Cigarettes    Quit date: 04/25/2007    Years since quitting: 13.9   Smokeless tobacco: Never  Vaping Use   Vaping Use: Never used  Substance and Sexual Activity   Alcohol use: Not Currently    Comment: rare   Drug use: No   Sexual activity: Yes  Other Topics Concern   Not on file  Social History Narrative   Not on file   Social Determinants of Health   Financial Resource Strain: Not on file  Food Insecurity: Not on file  Transportation Needs: Not on file  Physical Activity: Not on file  Stress: Not on file  Social Connections: Not on file  Intimate Partner Violence: Not on file    Family History  Problem Relation Age of Onset   COPD Father    Emphysema Father    Cancer - Lung Brother      Current  Outpatient Medications:    allopurinol (ZYLOPRIM) 100 MG tablet, Take 1 tablet (100 mg total) by mouth daily., Disp: 30 tablet, Rfl: 0   bisacodyl (DULCOLAX) 10 MG suppository, Place 1 suppository (10 mg total) rectally daily as needed for severe constipation., Disp: 12 suppository, Rfl: 0   docusate sodium (COLACE) 100 MG capsule, Take 200 mg by mouth daily., Disp: , Rfl:    gabapentin (NEURONTIN) 100 MG capsule, Take 1 capsule (100 mg total) by mouth 3 (three) times daily., Disp: 90 capsule, Rfl: 2   hydrochlorothiazide (HYDRODIURIL) 25 MG tablet, Take 25 mg by mouth daily., Disp: , Rfl:    levothyroxine (SYNTHROID) 300 MCG tablet, Take 300 mcg by mouth daily., Disp: , Rfl:    lidocaine-prilocaine (EMLA) cream, Apply to affected area once, Disp: 30 g, Rfl: 3   loratadine (CLARITIN) 10 MG tablet, Take 10 mg by mouth daily., Disp: , Rfl:    Multiple Vitamin (MULTI-VITAMIN) tablet, Take 1 tablet by mouth daily., Disp: , Rfl:    ondansetron (ZOFRAN) 8 MG tablet, Take 1 tablet (8 mg total) by mouth every 8 (eight) hours as needed for nausea or  vomiting., Disp: 90 tablet, Rfl: 1   oxyCODONE (OXY IR/ROXICODONE) 5 MG immediate release tablet, Take 1 tablet (5 mg total) by mouth every 4 (four) hours as needed for moderate pain., Disp: 60 tablet, Rfl: 0   pantoprazole (PROTONIX) 40 MG tablet, Take 1 tablet (40 mg total) by mouth 2 (two) times daily before a meal., Disp: 180 tablet, Rfl: 0   polyethylene glycol (MIRALAX / GLYCOLAX) 17 g packet, Take 17 g by mouth daily as needed for moderate constipation. (Patient taking differently: Take 17 g by mouth daily.), Disp: 14 each, Rfl: 0   potassium chloride (KLOR-CON) 10 MEQ tablet, Take 10 mEq by mouth daily., Disp: , Rfl:    predniSONE (DELTASONE) 20 MG tablet, Take 5 tablets (100 mg total) by mouth daily. Take with food on days 2-5 of chemotherapy., Disp: 25 tablet, Rfl: 5   prochlorperazine (COMPAZINE) 10 MG tablet, Take 1 tablet (10 mg total) by mouth every  6 (six) hours as needed for nausea or vomiting., Disp: 90 tablet, Rfl: 1   promethazine (PHENERGAN) 25 MG suppository, Place 1 suppository (25 mg total) rectally every 6 (six) hours as needed for nausea or vomiting., Disp: 24 each, Rfl: 0   fentaNYL (DURAGESIC) 25 MCG/HR, Place 1 patch onto the skin every 3 (three) days. (Patient not taking: No sig reported), Disp: 5 patch, Rfl: 0   Lidocaine, Anorectal, (HEMORRHOIDAL RELIEF) 5 % CREA, Apply 1 g topically every 8 (eight) hours as needed. (Patient not taking: Reported on 04/03/2021), Disp: 45 g, Rfl: 0 No current facility-administered medications for this visit.  Facility-Administered Medications Ordered in Other Visits:    heparin lock flush 100 UNIT/ML injection, , , ,    heparin lock flush 100 unit/mL, 500 Units, Intracatheter, Daily PRN, Earlie Server, MD  Physical exam:  Vitals:   04/03/21 0902  BP: (!) 131/92  Pulse: 76  Resp: 18  Temp: 97.7 F (36.5 C)  Weight: 265 lb 9.6 oz (120.5 kg)    Physical Exam Constitutional:      General: He is not in acute distress. HENT:     Head: Normocephalic and atraumatic.  Eyes:     General: No scleral icterus. Cardiovascular:     Rate and Rhythm: Normal rate and regular rhythm.     Heart sounds: Normal heart sounds.  Pulmonary:     Effort: Pulmonary effort is normal. No respiratory distress.     Breath sounds: No wheezing.  Abdominal:     General: Bowel sounds are normal. There is no distension.     Palpations: Abdomen is soft.  Musculoskeletal:        General: No deformity. Normal range of motion.     Cervical back: Normal range of motion and neck supple.  Skin:    General: Skin is warm and dry.     Coloration: Skin is pale.     Findings: No erythema or rash.  Neurological:     Mental Status: He is alert and oriented to person, place, and time. Mental status is at baseline.     Cranial Nerves: No cranial nerve deficit.     Coordination: Coordination normal.  Psychiatric:         Mood and Affect: Mood normal.  Left forearm tender/swelling.  CMP Latest Ref Rng & Units 04/03/2021  Glucose 70 - 99 mg/dL 206(H)  BUN 6 - 20 mg/dL 14  Creatinine 0.61 - 1.24 mg/dL 1.08  Sodium 135 - 145 mmol/L 133(L)  Potassium 3.5 - 5.1  mmol/L 3.6  Chloride 98 - 111 mmol/L 101  CO2 22 - 32 mmol/L 24  Calcium 8.9 - 10.3 mg/dL 8.4(L)  Total Protein 6.5 - 8.1 g/dL 6.5  Total Bilirubin 0.3 - 1.2 mg/dL <0.1(L)  Alkaline Phos 38 - 126 U/L 97  AST 15 - 41 U/L 44(H)  ALT 0 - 44 U/L 92(H)   CBC Latest Ref Rng & Units 04/03/2021  WBC 4.0 - 10.5 K/uL 9.5  Hemoglobin 13.0 - 17.0 g/dL 7.5(L)  Hematocrit 39.0 - 52.0 % 23.6(L)  Platelets 150 - 400 K/uL 264    RADIOGRAPHIC STUDIES: I have personally reviewed the radiological images as listed and agreed with the findings in the report. NM PET Image Initial (PI) Skull Base To Thigh  Result Date: 03/09/2021 CLINICAL DATA:  Initial treatment strategy for gastric mass. EXAM: NUCLEAR MEDICINE PET SKULL BASE TO THIGH TECHNIQUE: 14.6 mCi F-18 FDG was injected intravenously. Full-ring PET imaging was performed from the skull base to thigh after the radiotracer. CT data was obtained and used for attenuation correction and anatomic localization. Fasting blood glucose: 110 mg/dl COMPARISON:  February 20, 2021 CT of the abdomen and pelvis. FINDINGS: Mediastinal blood pool activity: SUV max 2.50 Liver activity: SUV max NA NECK: No hypermetabolic lymph nodes in the neck. Incidental CT findings: none CHEST: No hypermetabolic mediastinal or hilar nodes. No suspicious pulmonary nodules on the CT scan. Incidental CT findings: Minimal aortic atherosclerosis, signs of coronary artery disease and aortic calcification. Mild cardiac enlargement. Small LEFT-sided pleural effusion. No adenopathy by size criteria in the chest. Airways are patent. ABDOMEN/PELVIS: Large mass between stomach and spleen extending along gastrosplenic ligament and invading the splenic hilum and  splenic parenchyma shows central necrosis and gas with communication with the gastric lumen. Mass is markedly hypermetabolic with a maximum SUV of 33.47. LEFT adrenal gland with thickening up to 13 mm in the axial plane and an area of increased metabolic activity (image 583/0) 4.65 maximum SUV. Periportal lymph nodes of concern on the previous CT without uptake beyond background liver. Largest celiac node without uptake above blood pool. No signs of adenopathy or solid organ metastases elsewhere Incidental CT findings: No pericholecystic stranding. Liver with smooth contours. Pancreatic tail is intimately associated with mass in the gastrosplenic ligament which also extends into the splenorenal ligament. RIGHT adrenal gland is normal. No hydronephrosis. LEFT kidney is separate from the mass which appears to extend along the LEFT anterior renal fascia and other planes as discussed. Mass in direct communication with the stomach. No acute gastrointestinal process with signs of sigmoid diverticulosis and diverticular disease. Aortic atherosclerosis without aneurysm. Prostate unremarkable by CT. SKELETON: No focal hypermetabolic activity to suggest skeletal metastasis. Incidental CT findings: none IMPRESSION: Large ulcerated necrotic mass and direct communication with the gastric lumen, extending into splenic hilum and splenic parenchyma, also involving or abutting the distal pancreas, likely extending into the splenorenal ligament and abutting the under surface of the LEFT hemidiaphragm. This mass shows marked hypermetabolic features, LEFT adrenal nodularity and increased metabolic activity is suspicious for LEFT adrenal metastasis with a maximum SUV that is greater than liver activity. No increased metabolic activity about periportal and celiac lymph nodes. Electronically Signed   By: Zetta Bills M.D.   On: 03/09/2021 11:56   DG Chest Portable 1 View  Result Date: 03/27/2021 CLINICAL DATA:  Shortness of breath,  anemia and neutropenia EXAM: PORTABLE CHEST 1 VIEW COMPARISON:  Chest radiograph 03/10/2019 FINDINGS: There is a right chest wall port in  place with tip in the mid SVC. The cardiomediastinal silhouette is within normal limits There are patchy opacities in the lateral left base. There is no other focal consolidation. There is no pulmonary edema. There is no pleural effusion or pneumothorax. There is no acute osseous abnormality. IMPRESSION: Patchy opacities in the lateral left base are favored to reflect atelectasis; however, infection cannot be excluded. Electronically Signed   By: Valetta Mole M.D.   On: 03/27/2021 15:34   DG Chest Port 1 View  Result Date: 03/09/2021 CLINICAL DATA:  Status post Port-A-Cath placement in a 60 year old male. EXAM: PORTABLE CHEST 1 VIEW COMPARISON:  May 19, 2018 and PET exam from September of 2022. FINDINGS: RIGHT-sided Port-A-Cath enters via IJ approach. This terminates in the mid to distal superior vena cava. Mild rotation to the RIGHT. Accounting for this cardiomediastinal contours and hilar structures are normal. Minimal LEFT basilar airspace disease and potential LEFT pleural effusion. No pneumothorax, RIGHT costodiaphragmatic sulcus is excluded from view. On limited assessment there is no acute skeletal process. IMPRESSION: Port-A-Cath tip in the mid to distal superior vena cava, central venous placement. No visible pneumothorax. RIGHT costodiaphragmatic sulcus is excluded from view on this semi erect radiograph. Correlate with symptoms to determine whether repeat radiograph may be warranted. Minimal LEFT basilar airspace disease and potential LEFT pleural effusion. Electronically Signed   By: Zetta Bills M.D.   On: 03/09/2021 11:31   DG C-Arm 1-60 Min-No Report  Result Date: 03/09/2021 Fluoroscopy was utilized by the requesting physician.  No radiographic interpretation.   ECHOCARDIOGRAM COMPLETE  Result Date: 03/12/2021    ECHOCARDIOGRAM REPORT   Patient  Name:   [facility] [place] Date of Exam: 03/12/2021 Medical Rec #:  [id number]       Height:       76.0 in Accession #:    [id number]      Weight:       270.0 lb Date of Birth:  09-24-1960      BSA:          2.517 m Patient Age:    60 years        BP:           144/90 mmHg Patient Gender: M               HR:           57 bpm. Exam Location:  [facility] Procedure: 2D Echo, Cardiac Doppler, Color Doppler and Strain Analysis Indications:     Chemo Z09  History:         Patient has no prior history of Echocardiogram examinations.                  Risk Factors:Hypertension and Diabetes.  Sonographer:     Sherrie Sport Referring Phys:  [id number] [place] Diagnosing Phys: Kathlyn Sacramento MD  Sonographer Comments: Suboptimal apical window. Global longitudinal strain was attempted. IMPRESSIONS  1. Left ventricular ejection fraction, by estimation, is 55 to 60%. The left ventricle has normal function. The left ventricle has no regional wall motion abnormalities. There is moderate left ventricular hypertrophy. Left ventricular diastolic parameters are consistent with Grade I diastolic dysfunction (impaired relaxation).  2. Right ventricular systolic function is normal. The right ventricular size is normal. There is normal pulmonary artery systolic pressure.  3. Left atrial size was mildly dilated.  4. Right atrial size was mildly dilated.  5. The mitral valve is normal in structure. No evidence of mitral valve regurgitation. No  evidence of mitral stenosis.  6. The aortic valve is normal in structure. Aortic valve regurgitation is not visualized. Mild aortic valve sclerosis is present, with no evidence of aortic valve stenosis.  7. Aortic dilatation noted. There is mild dilatation of the aortic root, measuring 40 mm. FINDINGS  Left Ventricle: Left ventricular ejection fraction, by estimation, is 55 to 60%. The left ventricle has normal function. The left ventricle has no regional wall motion abnormalities. Global longitudinal strain performed  but not reported based on interpreter judgement due to suboptimal tracking. The left ventricular internal cavity size was normal in size. There is moderate left ventricular hypertrophy. Left ventricular diastolic parameters are consistent with Grade I diastolic dysfunction (impaired relaxation). Right Ventricle: The right ventricular size is normal. No increase in right ventricular wall thickness. Right ventricular systolic function is normal. There is normal pulmonary artery systolic pressure. The tricuspid regurgitant velocity is 2.73 m/s, and  with an assumed right atrial pressure of 5 mmHg, the estimated right ventricular systolic pressure is 16.1 mmHg. Left Atrium: Left atrial size was mildly dilated. Right Atrium: Right atrial size was mildly dilated. Pericardium: There is no evidence of pericardial effusion. Mitral Valve: The mitral valve is normal in structure. No evidence of mitral valve regurgitation. No evidence of mitral valve stenosis. Tricuspid Valve: The tricuspid valve is normal in structure. Tricuspid valve regurgitation is mild . No evidence of tricuspid stenosis. Aortic Valve: The aortic valve is normal in structure. Aortic valve regurgitation is not visualized. Mild aortic valve sclerosis is present, with no evidence of aortic valve stenosis. Aortic valve mean gradient measures 2.0 mmHg. Aortic valve peak gradient measures 4.5 mmHg. Aortic valve area, by VTI measures 5.20 cm. Pulmonic Valve: The pulmonic valve was normal in structure. Pulmonic valve regurgitation is not visualized. No evidence of pulmonic stenosis. Aorta: Aortic dilatation noted. There is mild dilatation of the aortic root, measuring 40 mm. Venous: The inferior vena cava was not well visualized. IAS/Shunts: No atrial level shunt detected by color flow Doppler.  LEFT VENTRICLE PLAX 2D LVIDd:         4.90 cm  Diastology LVIDs:         3.30 cm  LV e' medial:    4.46 cm/s LV PW:         1.90 cm  LV E/e' medial:  10.8 LV IVS:         1.60 cm  LV e' lateral:   7.29 cm/s LVOT diam:     2.30 cm  LV E/e' lateral: 6.6 LV SV:         79 LV SV Index:   32 LVOT Area:     4.15 cm  LEFT ATRIUM             Index       RIGHT ATRIUM           Index LA diam:        3.80 cm 1.51 cm/m  RA Area:     22.90 cm LA Vol (A2C):   89.3 ml 35.48 ml/m RA Volume:   65.90 ml  26.19 ml/m LA Vol (A4C):   82.4 ml 32.74 ml/m LA Biplane Vol: 89.9 ml 35.72 ml/m  AORTIC VALVE                   PULMONIC VALVE AV Area (Vmax):    4.10 cm    PV Vmax:        0.51 m/s AV Area (Vmean):  4.30 cm    PV Peak grad:   1.0 mmHg AV Area (VTI):     5.20 cm    RVOT Peak grad: 4 mmHg AV Vmax:           106.50 cm/s AV Vmean:          69.350 cm/s AV VTI:            0.153 m AV Peak Grad:      4.5 mmHg AV Mean Grad:      2.0 mmHg LVOT Vmax:         105.00 cm/s LVOT Vmean:        71.700 cm/s LVOT VTI:          0.191 m LVOT/AV VTI ratio: 1.25  AORTA Ao Root diam: 3.93 cm MITRAL VALVE               TRICUSPID VALVE MV Area (PHT): 4.68 cm    TR Peak grad:   29.8 mmHg MV Decel Time: 162 msec    TR Vmax:        273.00 cm/s MV E velocity: 48.00 cm/s MV A velocity: 73.30 cm/s  SHUNTS MV E/A ratio:  0.65        Systemic VTI:  0.19 m                            Systemic Diam: 2.30 cm Kathlyn Sacramento MD Electronically signed by Kathlyn Sacramento MD Signature Date/Time: 03/12/2021/11:05:50 AM    Final    DG FLUORO GUIDED LOC OF NEEDLE/CATH TIP FOR SPINAL INJECT LT  Result Date: 03/21/2021 CLINICAL DATA:  Patient with diffuse large B-cell lymphoma request received for diagnostic and therapeutic lumbar puncture. EXAM: FLUOROSCOPICALLY GUIDED LUMBAR PUNCTURE FOR INTRATHECAL CHEMOTHERAPY AND DIAGNOSTIC LUMBAR PUNCTURE TECHNIQUE: Informed consent was obtained from the patient prior to the procedure, including potential complications of CSF leak requiring additional procedure, paresthesias, bleeding, infection, side effects from medication, seizures, headache, allergy, and pain. A 'time out' was performed.  With the patient prone, the lower back was prepped with Betadine. 1% Lidocaine was used for local anesthesia. Lumbar puncture was performed at the L4-L5 using a 20 gauge needle with return of clear CSF. A total of 9 mL of CSF fluid was obtained and sent to the laboratory for analysis. Opening pressure 15 cm of H2O. 12 mg of methotrexate was injected into the subarachnoid space. The patient tolerated the procedure well without apparent complication. A sterile dressing was applied. FLUOROSCOPY TIME:  0.2 minute, 2 images IMPRESSION: Technically successful fluoroscopic guided lumbar puncture. Intrathecal injection of chemotherapy without complication. Read By: Tsosie Billing PA-C Electronically Signed   By: Kathreen Devoid M.D.   On: 03/21/2021 13:03     Assessment and plan  1. Left arm swelling   2. 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[place])   3. Iron deficiency anemia due to chronic blood loss   4. Antineoplastic chemotherapy induced anemia   5. Neoplasm related pain   6. Chemotherapy induced neutropenia ([facility])   7. Hypercalcemia   Cancer Staging DLBCL (diffuse large B cell lymphoma) ([place]) Staging form: Hodgkin and Non-Hodgkin Lymphoma, AJCC 8th Edition - Clinical stage from 03/13/2021: Stage IV (Diffuse large B-cell lymphoma) - Signed by Earlie Server, MD on 03/15/2021  #Gastric mass status biopsy, splenic/adrenal involvement. MYC, BCL2, BCL6 FISH is negative.  Diffuse large B-cell lymphoma NOS Status post 1 cycle of R- CHOP  with Day 2 IT- MTX with G-CSF. Continue allopurinol. Labs reviewed and discussed with patient.  #Symptomatic anemia, likely chemotherapy-induced.  Status post 3 PRBC transfusion last week. Hemoglobin is 7.5 today. Will proceed with 1 unit of PRBC transfusion today   #Chemotherapy-induced neutropenia.  Patient has received G-CSF support.  ANC has recovered.  Off prophylactic antibiotics Cipro  # Nausea and vomiting, continue Zofran and Compazine PRN.   Hold Phenergan suppository when he is neutropenic.Marland Kitchen  Overall his symptom has improved.  # Abdominal pain, did not tolerate fentanyl patch.  Continue Oxycodone 53m Q4 hours as needed.  Pain has improved.   # baseline echo showed normal LVEF. Grade 1 diastolic dysfunction. Will refer him to cardiology in the future.   #renal impairment, his kidney function has improved.  Encourage oral hydration.  # IDA, hemoglobin is improving.  Status post multiple IV Venofer treatments.  # Hypercalcemia, probably due to AKI, dehydration.  S/p Zometa x 1. Calcium has decreased.  #Left arm redness/tenderness, ultrasound right upper extremity to rule out DVT. Aspect of thrombophlebitis.  If negative for DVT, he may use warm compression.   Follow-up  1 week lab MD cycle 2 R-CHOP with day 2 intrathecal methotrexate G-CSF+gcsf  ZEarlie Server MD, PhD  04/03/2021

## 2021-04-04 LAB — TYPE AND SCREEN
ABO/RH(D): O POS
Antibody Screen: NEGATIVE
Unit division: 0

## 2021-04-04 LAB — BPAM RBC
Blood Product Expiration Date: 202210312359
ISSUE DATE / TIME: 202210181156
Unit Type and Rh: 9500

## 2021-04-05 LAB — SAMPLE TO BLOOD BANK

## 2021-04-06 ENCOUNTER — Other Ambulatory Visit: Payer: Self-pay

## 2021-04-06 ENCOUNTER — Inpatient Hospital Stay: Payer: BC Managed Care – PPO

## 2021-04-06 VITALS — BP 140/90 | HR 78 | Temp 98.5°F

## 2021-04-06 DIAGNOSIS — Z5112 Encounter for antineoplastic immunotherapy: Secondary | ICD-10-CM | POA: Diagnosis not present

## 2021-04-06 DIAGNOSIS — D500 Iron deficiency anemia secondary to blood loss (chronic): Secondary | ICD-10-CM

## 2021-04-06 MED ORDER — SODIUM CHLORIDE 0.9 % IV SOLN
Freq: Once | INTRAVENOUS | Status: AC
  Filled 2021-04-06: qty 250

## 2021-04-06 MED ORDER — SODIUM CHLORIDE 0.9% FLUSH
10.0000 mL | Freq: Once | INTRAVENOUS | Status: AC | PRN
  Administered 2021-04-06: 10 mL
  Filled 2021-04-06: qty 10

## 2021-04-06 MED ORDER — HEPARIN SOD (PORK) LOCK FLUSH 100 UNIT/ML IV SOLN
500.0000 [IU] | Freq: Once | INTRAVENOUS | Status: AC | PRN
  Administered 2021-04-06: 500 [IU]
  Filled 2021-04-06: qty 5

## 2021-04-06 NOTE — Patient Instructions (Signed)

## 2021-04-10 ENCOUNTER — Inpatient Hospital Stay: Payer: BC Managed Care – PPO

## 2021-04-10 ENCOUNTER — Ambulatory Visit: Payer: BC Managed Care – PPO

## 2021-04-10 ENCOUNTER — Encounter: Payer: Self-pay | Admitting: Oncology

## 2021-04-10 ENCOUNTER — Other Ambulatory Visit: Payer: Self-pay

## 2021-04-10 ENCOUNTER — Inpatient Hospital Stay (HOSPITAL_BASED_OUTPATIENT_CLINIC_OR_DEPARTMENT_OTHER): Payer: BC Managed Care – PPO | Admitting: Oncology

## 2021-04-10 VITALS — BP 120/77 | HR 76 | Temp 96.8°F | Resp 17

## 2021-04-10 VITALS — BP 140/90 | HR 78 | Temp 97.7°F | Resp 18 | Wt 272.9 lb

## 2021-04-10 DIAGNOSIS — C8338 Diffuse large B-cell lymphoma, lymph nodes of multiple sites: Secondary | ICD-10-CM

## 2021-04-10 DIAGNOSIS — I809 Phlebitis and thrombophlebitis of unspecified site: Secondary | ICD-10-CM | POA: Insufficient documentation

## 2021-04-10 DIAGNOSIS — D500 Iron deficiency anemia secondary to blood loss (chronic): Secondary | ICD-10-CM

## 2021-04-10 DIAGNOSIS — D701 Agranulocytosis secondary to cancer chemotherapy: Secondary | ICD-10-CM

## 2021-04-10 DIAGNOSIS — Z5112 Encounter for antineoplastic immunotherapy: Secondary | ICD-10-CM | POA: Diagnosis not present

## 2021-04-10 DIAGNOSIS — T451X5A Adverse effect of antineoplastic and immunosuppressive drugs, initial encounter: Secondary | ICD-10-CM

## 2021-04-10 DIAGNOSIS — G893 Neoplasm related pain (acute) (chronic): Secondary | ICD-10-CM

## 2021-04-10 DIAGNOSIS — Z5111 Encounter for antineoplastic chemotherapy: Secondary | ICD-10-CM

## 2021-04-10 DIAGNOSIS — D6481 Anemia due to antineoplastic chemotherapy: Secondary | ICD-10-CM | POA: Diagnosis not present

## 2021-04-10 LAB — CBC WITH DIFFERENTIAL/PLATELET
Abs Immature Granulocytes: 0.52 10*3/uL — ABNORMAL HIGH (ref 0.00–0.07)
Basophils Absolute: 0.1 10*3/uL (ref 0.0–0.1)
Basophils Relative: 1 %
Eosinophils Absolute: 0 10*3/uL (ref 0.0–0.5)
Eosinophils Relative: 0 %
HCT: 26.3 % — ABNORMAL LOW (ref 39.0–52.0)
Hemoglobin: 8.2 g/dL — ABNORMAL LOW (ref 13.0–17.0)
Immature Granulocytes: 5 %
Lymphocytes Relative: 8 %
Lymphs Abs: 0.8 10*3/uL (ref 0.7–4.0)
MCH: 26 pg (ref 26.0–34.0)
MCHC: 31.2 g/dL (ref 30.0–36.0)
MCV: 83.5 fL (ref 80.0–100.0)
Monocytes Absolute: 0.7 10*3/uL (ref 0.1–1.0)
Monocytes Relative: 7 %
Neutro Abs: 8.7 10*3/uL — ABNORMAL HIGH (ref 1.7–7.7)
Neutrophils Relative %: 79 %
Platelets: 342 10*3/uL (ref 150–400)
RBC: 3.15 MIL/uL — ABNORMAL LOW (ref 4.22–5.81)
RDW: 21.4 % — ABNORMAL HIGH (ref 11.5–15.5)
WBC: 10.8 10*3/uL — ABNORMAL HIGH (ref 4.0–10.5)
nRBC: 0 % (ref 0.0–0.2)

## 2021-04-10 LAB — COMPREHENSIVE METABOLIC PANEL
ALT: 40 U/L (ref 0–44)
AST: 22 U/L (ref 15–41)
Albumin: 2.9 g/dL — ABNORMAL LOW (ref 3.5–5.0)
Alkaline Phosphatase: 105 U/L (ref 38–126)
Anion gap: 8 (ref 5–15)
BUN: 14 mg/dL (ref 6–20)
CO2: 26 mmol/L (ref 22–32)
Calcium: 8 mg/dL — ABNORMAL LOW (ref 8.9–10.3)
Chloride: 102 mmol/L (ref 98–111)
Creatinine, Ser: 0.88 mg/dL (ref 0.61–1.24)
GFR, Estimated: >60 mL/min (ref >60)
Glucose, Bld: 165 mg/dL — ABNORMAL HIGH (ref 70–99)
Potassium: 3.7 mmol/L (ref 3.5–5.1)
Sodium: 136 mmol/L (ref 135–145)
Total Bilirubin: 0.2 mg/dL — ABNORMAL LOW (ref 0.3–1.2)
Total Protein: 6.6 g/dL (ref 6.5–8.1)

## 2021-04-10 MED ORDER — HEPARIN SOD (PORK) LOCK FLUSH 100 UNIT/ML IV SOLN
500.0000 [IU] | Freq: Once | INTRAVENOUS | Status: DC | PRN
  Filled 2021-04-10: qty 5

## 2021-04-10 MED ORDER — PALONOSETRON HCL INJECTION 0.25 MG/5ML
0.2500 mg | Freq: Once | INTRAVENOUS | Status: AC
  Administered 2021-04-10: 0.25 mg via INTRAVENOUS
  Filled 2021-04-10: qty 5

## 2021-04-10 MED ORDER — VINCRISTINE SULFATE CHEMO INJECTION 1 MG/ML
2.0000 mg | Freq: Once | INTRAVENOUS | Status: AC
  Administered 2021-04-10: 2 mg via INTRAVENOUS
  Filled 2021-04-10: qty 2

## 2021-04-10 MED ORDER — PREDNISONE 20 MG PO TABS: 100.0000 mg | ORAL_TABLET | Freq: Every day | ORAL | 4 refills | Status: AC

## 2021-04-10 MED ORDER — HEPARIN SOD (PORK) LOCK FLUSH 100 UNIT/ML IV SOLN
500.0000 [IU] | Freq: Once | INTRAVENOUS | Status: AC
  Filled 2021-04-10: qty 5

## 2021-04-10 MED ORDER — SODIUM CHLORIDE 0.9 % IV SOLN
375.0000 mg/m2 | Freq: Once | INTRAVENOUS | Status: AC
  Administered 2021-04-10: 1000 mg via INTRAVENOUS
  Filled 2021-04-10: qty 100

## 2021-04-10 MED ORDER — DOXORUBICIN HCL CHEMO IV INJECTION 2 MG/ML
50.0000 mg/m2 | Freq: Once | INTRAVENOUS | Status: AC
  Administered 2021-04-10: 128 mg via INTRAVENOUS
  Filled 2021-04-10: qty 50

## 2021-04-10 MED ORDER — SODIUM CHLORIDE 0.9 % IV SOLN
12.0000 mg | Freq: Once | INTRAVENOUS | Status: AC
  Administered 2021-04-10: 12 mg via INTRAVENOUS
  Filled 2021-04-10: qty 1.2

## 2021-04-10 MED ORDER — ACETAMINOPHEN 325 MG PO TABS
650.0000 mg | ORAL_TABLET | Freq: Once | ORAL | Status: AC
  Administered 2021-04-10: 650 mg via ORAL
  Filled 2021-04-10: qty 2

## 2021-04-10 MED ORDER — SODIUM CHLORIDE 0.9 % IV SOLN
150.0000 mg | Freq: Once | INTRAVENOUS | Status: AC
  Administered 2021-04-10: 150 mg via INTRAVENOUS
  Filled 2021-04-10: qty 150

## 2021-04-10 MED ORDER — DIPHENHYDRAMINE HCL 25 MG PO CAPS
50.0000 mg | ORAL_CAPSULE | Freq: Once | ORAL | Status: AC
  Administered 2021-04-10: 50 mg via ORAL
  Filled 2021-04-10: qty 2

## 2021-04-10 MED ORDER — HEPARIN SOD (PORK) LOCK FLUSH 100 UNIT/ML IV SOLN
INTRAVENOUS | Status: AC
  Administered 2021-04-10: 500 [IU] via INTRAVENOUS
  Filled 2021-04-10: qty 5

## 2021-04-10 MED ORDER — SODIUM CHLORIDE 0.9 % IV SOLN
750.0000 mg/m2 | Freq: Once | INTRAVENOUS | Status: AC
  Administered 2021-04-10: 1920 mg via INTRAVENOUS
  Filled 2021-04-10: qty 96

## 2021-04-10 MED ORDER — SODIUM CHLORIDE 0.9 % IV SOLN
Freq: Once | INTRAVENOUS | Status: AC
  Filled 2021-04-10: qty 250

## 2021-04-10 MED ORDER — SODIUM CHLORIDE 0.9% FLUSH
10.0000 mL | INTRAVENOUS | Status: DC | PRN
  Administered 2021-04-10: 10 mL via INTRAVENOUS
  Filled 2021-04-10: qty 10

## 2021-04-10 NOTE — Progress Notes (Addendum)
Hematology/Oncology Follow Up Note [facility]  [phone] [phone]  Patient Care Team: Sharyne Peach, MD as PCP - General (Family Medicine) Earlie Server, MD as Consulting Physician (Hematology and Oncology)   Name of the patient: Zachary Perkins  [id number]  08-19-1960   REASON FOR VISIT Follow-up for DLBCL  PERTINENT ONCOLOGY HISTORY -02/21/2021 EGD showed large fungating ulcerated noncircumferential mass with no bleeding and no stigmata of recent bleeding was found on the greater curvature of the stomach.  Biopsy was taken.  Large amount of necrotic and ulcerated appearing tissue.  Acute duodenitis.  Gastritis Awaiting for pathology report.  PET scan has been scheduled for outpatient. 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 Patient has also received PRBC transfusions as well as IV Venofer treatments daily x3 during hospitalization.  Today he reports feeling tired and fatigued.  Appetite is fair. Constipation, last bowel movement was yesterday.  Small amount.  He takes Senokot 1 tablet twice daily. Left flank pain, intermittent.  He takes pain medication.  # 02/26/2021 Bone marrow biopsy showed no bone marrow involvement. 03/09/2021, PET scan showed large ulcerated necrotic mass in direct communication with the gastric lumen, extending to splenic hilum, splenic parenchyma, also involving/and abutting the distal pancreas.  Likely extending into the splenorenal ligament and abutting the under surface of left hemi diagram.  Left adrenal nodularity and increased metabolic activity suspicious for adrenal metastasis. No increased metabolic activity about periportal and celiac lymph nodes.  INTERVAL HISTORY Zachary Perkins is a 60 y.o. male who has above history reviewed by me today presents for follow up for diffuse large B-cell lymphoma. 03/20/2021, status  post cycle 1 R-CHOP intrathecal MTX with GCSF support.  Patient has required multiple units PRBC transfusion for chemotherapy-induced symptomatic anemia. Today he feels slightly better.  Still fatigued. Denies any nausea vomiting diarrhea. He takes oxycodone as needed.  Pain is controlled. No fever, chills, he has gained weight compared to last visit.  Review of Systems  Constitutional:  Positive for appetite change and fatigue. Negative for chills, fever and unexpected weight change.  HENT:   Negative for hearing loss and voice change.   Eyes:  Negative for eye problems and icterus.  Respiratory:  Negative for chest tightness, cough and shortness of breath.   Cardiovascular:  Negative for chest pain and leg swelling.  Gastrointestinal:  Positive for nausea. Negative for abdominal distention, abdominal pain, constipation and vomiting.  Endocrine: Negative for hot flashes.  Genitourinary:  Negative for difficulty urinating, dysuria and frequency.   Musculoskeletal:  Negative for arthralgias.       Left forearm swelling and pain  Skin:  Negative for itching and rash.  Neurological:  Positive for numbness. Negative for light-headedness.  Hematological:  Negative for adenopathy. Does not bruise/bleed easily.  Psychiatric/Behavioral:  Negative for confusion.      Allergies  Allergen Reactions   Lisinopril Cough   Losartan Diarrhea   Metformin And Related     PASSED OUT     Past Medical History:  Diagnosis Date   Diabetes mellitus without complication ([place])    Diffuse large B cell lymphoma ([place])    Hypertension    Hypothyroidism    Morbid obesity ([place])      Past Surgical History:  Procedure Laterality Date   CARPAL TUNNEL RELEASE Left    ESOPHAGOGASTRODUODENOSCOPY (EGD) WITH PROPOFOL N/A 02/21/2021   Procedure: ESOPHAGOGASTRODUODENOSCOPY (EGD)  WITH PROPOFOL;  Surgeon: Annamaria Helling, DO;  Location: [facility] ENDOSCOPY;  Service: Gastroenterology;  Laterality: N/A;  after  2pm   INSERTION OF MESH N/A 05/02/2017   Procedure: INSERTION OF MESH;  Surgeon: Leonie Green, MD;  Location: [facility] ORS;  Service: General;  Laterality: N/A;   lasix eye surgery     PORTACATH PLACEMENT Right 03/09/2021   Procedure: INSERTION PORT-A-CATH;  Surgeon: Jules Husbands, MD;  Location: [facility] ORS;  Service: General;  Laterality: Right;   UMBILICAL HERNIA REPAIR N/A 05/02/2017   Procedure: HERNIA REPAIR UMBILICAL ADULT;  Surgeon: Leonie Green, MD;  Location: [facility] ORS;  Service: General;  Laterality: N/A;    Social History   Socioeconomic History   Marital status: Married    Spouse name: Melonie   Number of children: 0   Years of education: 12   Highest education level: 12th grade  Occupational History   Not on file  Tobacco Use   Smoking status: Former    Packs/day: 1.00    Years: 20.00    Pack years: 20.00    Types: Cigarettes    Quit date: 04/25/2007    Years since quitting: 13.9   Smokeless tobacco: Never  Vaping Use   Vaping Use: Never used  Substance and Sexual Activity   Alcohol use: Not Currently    Comment: rare   Drug use: No   Sexual activity: Yes  Other Topics Concern   Not on file  Social History Narrative   Not on file   Social Determinants of Health   Financial Resource Strain: Not on file  Food Insecurity: Not on file  Transportation Needs: Not on file  Physical Activity: Not on file  Stress: Not on file  Social Connections: Not on file  Intimate Partner Violence: Not on file    Family History  Problem Relation Age of Onset   COPD Father    Emphysema Father    Cancer - Lung Brother      Current Outpatient Medications:    allopurinol (ZYLOPRIM) 100 MG tablet, Take 1 tablet (100 mg total) by mouth daily., Disp: 30 tablet, Rfl: 0   bisacodyl (DULCOLAX) 10 MG suppository, Place 1 suppository (10 mg total) rectally daily as needed for severe constipation., Disp: 12 suppository, Rfl: 0   docusate sodium (COLACE) 100 MG  capsule, Take 200 mg by mouth daily., Disp: , Rfl:    gabapentin (NEURONTIN) 100 MG capsule, Take 1 capsule (100 mg total) by mouth 3 (three) times daily. (Patient taking differently: Take 100 mg by mouth 2 (two) times daily.), Disp: 90 capsule, Rfl: 2   levothyroxine (SYNTHROID) 300 MCG tablet, Take 300 mcg by mouth daily., Disp: , Rfl:    Lidocaine, Anorectal, (HEMORRHOIDAL RELIEF) 5 % CREA, Apply 1 g topically every 8 (eight) hours as needed., Disp: 45 g, Rfl: 0   lidocaine-prilocaine (EMLA) cream, Apply to affected area once, Disp: 30 g, Rfl: 3   loratadine (CLARITIN) 10 MG tablet, Take 10 mg by mouth daily. With GCSF injections, Disp: , Rfl:    Multiple Vitamin (MULTI-VITAMIN) tablet, Take 1 tablet by mouth daily., Disp: , Rfl:    ondansetron (ZOFRAN) 8 MG tablet, Take 1 tablet (8 mg total) by mouth every 8 (eight) hours as needed for nausea or vomiting., Disp: 90 tablet, Rfl: 1   oxyCODONE (OXY IR/ROXICODONE) 5 MG immediate release tablet, Take 1 tablet (5 mg total) by mouth every 4 (four) hours as needed for moderate pain., Disp: 60  tablet, Rfl: 0   pantoprazole (PROTONIX) 40 MG tablet, Take 1 tablet (40 mg total) by mouth 2 (two) times daily before a meal., Disp: 180 tablet, Rfl: 0   polyethylene glycol (MIRALAX / GLYCOLAX) 17 g packet, Take 17 g by mouth daily as needed for moderate constipation. (Patient taking differently: Take 17 g by mouth daily.), Disp: 14 each, Rfl: 0   potassium chloride (KLOR-CON) 10 MEQ tablet, Take 10 mEq by mouth daily., Disp: , Rfl:    prochlorperazine (COMPAZINE) 10 MG tablet, Take 1 tablet (10 mg total) by mouth every 6 (six) hours as needed for nausea or vomiting., Disp: 90 tablet, Rfl: 1   promethazine (PHENERGAN) 25 MG suppository, Place 1 suppository (25 mg total) rectally every 6 (six) hours as needed for nausea or vomiting., Disp: 24 each, Rfl: 0   fentaNYL (DURAGESIC) 25 MCG/HR, Place 1 patch onto the skin every 3 (three) days. (Patient not taking: No sig  reported), Disp: 5 patch, Rfl: 0   hydrochlorothiazide (HYDRODIURIL) 25 MG tablet, Take 25 mg by mouth daily. (Patient not taking: Reported on 04/10/2021), Disp: , Rfl:    predniSONE (DELTASONE) 20 MG tablet, Take 5 tablets (100 mg total) by mouth daily. Take with food on days 2-5 of chemotherapy., Disp: 25 tablet, Rfl: 4 No current facility-administered medications for this visit.  Facility-Administered Medications Ordered in Other Visits:    heparin lock flush 100 UNIT/ML injection, , , ,    heparin lock flush 100 unit/mL, 500 Units, Intravenous, Once, Earlie Server, MD   heparin lock flush 100 unit/mL, 500 Units, Intracatheter, Once PRN, Earlie Server, MD   sodium chloride flush (NS) 0.9 % injection 10 mL, 10 mL, Intravenous, PRN, Earlie Server, MD, 10 mL at 04/10/21 7510  Physical exam:  Vitals:   04/10/21 0853  BP: 140/90  Pulse: 78  Resp: 18  Temp: 97.7 F (36.5 C)  Weight: 272 lb 14.4 oz (123.8 kg)    Physical Exam Constitutional:      General: He is not in acute distress. HENT:     Head: Normocephalic and atraumatic.  Eyes:     General: No scleral icterus. Cardiovascular:     Rate and Rhythm: Normal rate and regular rhythm.     Heart sounds: Normal heart sounds.  Pulmonary:     Effort: Pulmonary effort is normal. No respiratory distress.     Breath sounds: No wheezing.  Abdominal:     General: Bowel sounds are normal. There is no distension.     Palpations: Abdomen is soft.  Musculoskeletal:        General: No deformity. Normal range of motion.     Cervical back: Normal range of motion and neck supple.  Skin:    General: Skin is warm and dry.     Coloration: Skin is pale.     Findings: No erythema or rash.  Neurological:     Mental Status: He is alert and oriented to person, place, and time. Mental status is at baseline.     Cranial Nerves: No cranial nerve deficit.     Coordination: Coordination normal.  Psychiatric:        Mood and Affect: Mood normal.  Left forearm  tender/swelling improved.  CMP Latest Ref Rng & Units 04/10/2021  Glucose 70 - 99 mg/dL 165(H)  BUN 6 - 20 mg/dL 14  Creatinine 0.61 - 1.24 mg/dL 0.88  Sodium 135 - 145 mmol/L 136  Potassium 3.5 - 5.1 mmol/L 3.7  Chloride 98 -  111 mmol/L 102  CO2 22 - 32 mmol/L 26  Calcium 8.9 - 10.3 mg/dL 8.0(L)  Total Protein 6.5 - 8.1 g/dL 6.6  Total Bilirubin 0.3 - 1.2 mg/dL 0.2(L)  Alkaline Phos 38 - 126 U/L 105  AST 15 - 41 U/L 22  ALT 0 - 44 U/L 40   CBC Latest Ref Rng & Units 04/10/2021  WBC 4.0 - 10.5 K/uL 10.8(H)  Hemoglobin 13.0 - 17.0 g/dL 8.2(L)  Hematocrit 39.0 - 52.0 % 26.3(L)  Platelets 150 - 400 K/uL 342    RADIOGRAPHIC STUDIES: I have personally reviewed the radiological images as listed and agreed with the findings in the report. US Venous Img Upper Uni Left  Result Date: 04/03/2021 CLINICAL DATA:  Left upper extremity pain and edema for the past 2 days. Evaluate for DVT. EXAM: LEFT UPPER EXTREMITY VENOUS DOPPLER ULTRASOUND TECHNIQUE: Gray-scale sonography with graded compression, as well as color Doppler and duplex ultrasound were performed to evaluate the upper extremity deep venous system from the level of the subclavian vein and including the jugular, axillary, basilic, radial, ulnar and upper cephalic vein. Spectral Doppler was utilized to evaluate flow at rest and with distal augmentation maneuvers. COMPARISON:  None. FINDINGS: Contralateral Subclavian Vein: Respiratory phasicity is normal and symmetric with the symptomatic side. No evidence of thrombus. Normal compressibility. Internal Jugular Vein: No evidence of thrombus. Normal compressibility, respiratory phasicity and response to augmentation. Subclavian Vein: No evidence of thrombus. Normal compressibility, respiratory phasicity and response to augmentation. Axillary Vein: No evidence of thrombus. Normal compressibility, respiratory phasicity and response to augmentation. Cephalic Vein: While the cephalic vein appears  patent centrally (image 19), there is hypoechoic occlusive thrombus within the cephalic vein at the level of the distal humerus (image 17; 26 through 29)) extending to the level of the proximal forearm (image 18). Basilic Vein: No evidence of thrombus. Normal compressibility, respiratory phasicity and response to augmentation. Brachial Veins: No evidence of thrombus. Normal compressibility, respiratory phasicity and response to augmentation. Radial Veins: No evidence of thrombus. Normal compressibility, respiratory phasicity and response to augmentation. Ulnar Veins: No evidence of thrombus. Normal compressibility, respiratory phasicity and response to augmentation. Other Findings:  None visualized. IMPRESSION: 1. No evidence of DVT within the left upper extremity. 2. Examination is positive for occlusive superficial thrombophlebitis involving the cephalic vein extending from the level of the distal humerus to the proximal forearm. Note, the occluded segment of the cephalic vein at the level of the forearm correlates with the patient's area of swelling. Electronically Signed   By: Sandi Mariscal M.D.   On: 04/03/2021 15:25   DG Chest Portable 1 View  Result Date: 03/27/2021 CLINICAL DATA:  Shortness of breath, anemia and neutropenia EXAM: PORTABLE CHEST 1 VIEW COMPARISON:  Chest radiograph 03/10/2019 FINDINGS: There is a right chest wall port in place with tip in the mid SVC. The cardiomediastinal silhouette is within normal limits There are patchy opacities in the lateral left base. There is no other focal consolidation. There is no pulmonary edema. There is no pleural effusion or pneumothorax. There is no acute osseous abnormality. IMPRESSION: Patchy opacities in the lateral left base are favored to reflect atelectasis; however, infection cannot be excluded. Electronically Signed   By: Valetta Mole M.D.   On: 03/27/2021 15:34   ECHOCARDIOGRAM COMPLETE  Result Date: 03/12/2021    ECHOCARDIOGRAM REPORT    Patient Name:   ABAD MANARD Milson Date of Exam: 03/12/2021 Medical Rec #:  [id number]  Height:       76.0 in Accession #:    [id number]      Weight:       270.0 lb Date of Birth:  1960-07-29      BSA:          2.517 m Patient Age:    23 years        BP:           144/90 mmHg Patient Gender: M               HR:           57 bpm. Exam Location:  [facility] Procedure: 2D Echo, Cardiac Doppler, Color Doppler and Strain Analysis Indications:     Chemo Z09  History:         Patient has no prior history of Echocardiogram examinations.                  Risk Factors:Hypertension and Diabetes.  Sonographer:     Sherrie Sport Referring Phys:  [id number] [place] Diagnosing Phys: Kathlyn Sacramento MD  Sonographer Comments: Suboptimal apical window. Global longitudinal strain was attempted. IMPRESSIONS  1. Left ventricular ejection fraction, by estimation, is 55 to 60%. The left ventricle has normal function. The left ventricle has no regional wall motion abnormalities. There is moderate left ventricular hypertrophy. Left ventricular diastolic parameters are consistent with Grade I diastolic dysfunction (impaired relaxation).  2. Right ventricular systolic function is normal. The right ventricular size is normal. There is normal pulmonary artery systolic pressure.  3. Left atrial size was mildly dilated.  4. Right atrial size was mildly dilated.  5. The mitral valve is normal in structure. No evidence of mitral valve regurgitation. No evidence of mitral stenosis.  6. The aortic valve is normal in structure. Aortic valve regurgitation is not visualized. Mild aortic valve sclerosis is present, with no evidence of aortic valve stenosis.  7. Aortic dilatation noted. There is mild dilatation of the aortic root, measuring 40 mm. FINDINGS  Left Ventricle: Left ventricular ejection fraction, by estimation, is 55 to 60%. The left ventricle has normal function. The left ventricle has no regional wall motion abnormalities. Global longitudinal strain  performed but not reported based on interpreter judgement due to suboptimal tracking. The left ventricular internal cavity size was normal in size. There is moderate left ventricular hypertrophy. Left ventricular diastolic parameters are consistent with Grade I diastolic dysfunction (impaired relaxation). Right Ventricle: The right ventricular size is normal. No increase in right ventricular wall thickness. Right ventricular systolic function is normal. There is normal pulmonary artery systolic pressure. The tricuspid regurgitant velocity is 2.73 m/s, and  with an assumed right atrial pressure of 5 mmHg, the estimated right ventricular systolic pressure is 74.9 mmHg. Left Atrium: Left atrial size was mildly dilated. Right Atrium: Right atrial size was mildly dilated. Pericardium: There is no evidence of pericardial effusion. Mitral Valve: The mitral valve is normal in structure. No evidence of mitral valve regurgitation. No evidence of mitral valve stenosis. Tricuspid Valve: The tricuspid valve is normal in structure. Tricuspid valve regurgitation is mild . No evidence of tricuspid stenosis. Aortic Valve: The aortic valve is normal in structure. Aortic valve regurgitation is not visualized. Mild aortic valve sclerosis is present, with no evidence of aortic valve stenosis. Aortic valve mean gradient measures 2.0 mmHg. Aortic valve peak gradient measures 4.5 mmHg. Aortic valve area, by VTI measures 5.20 cm. Pulmonic Valve: The pulmonic valve was normal in structure. Pulmonic  valve regurgitation is not visualized. No evidence of pulmonic stenosis. Aorta: Aortic dilatation noted. There is mild dilatation of the aortic root, measuring 40 mm. Venous: The inferior vena cava was not well visualized. IAS/Shunts: No atrial level shunt detected by color flow Doppler.  LEFT VENTRICLE PLAX 2D LVIDd:         4.90 cm  Diastology LVIDs:         3.30 cm  LV e' medial:    4.46 cm/s LV PW:         1.90 cm  LV E/e' medial:  10.8 LV  IVS:        1.60 cm  LV e' lateral:   7.29 cm/s LVOT diam:     2.30 cm  LV E/e' lateral: 6.6 LV SV:         79 LV SV Index:   32 LVOT Area:     4.15 cm  LEFT ATRIUM             Index       RIGHT ATRIUM           Index LA diam:        3.80 cm 1.51 cm/m  RA Area:     22.90 cm LA Vol (A2C):   89.3 ml 35.48 ml/m RA Volume:   65.90 ml  26.19 ml/m LA Vol (A4C):   82.4 ml 32.74 ml/m LA Biplane Vol: 89.9 ml 35.72 ml/m  AORTIC VALVE                   PULMONIC VALVE AV Area (Vmax):    4.10 cm    PV Vmax:        0.51 m/s AV Area (Vmean):   4.30 cm    PV Peak grad:   1.0 mmHg AV Area (VTI):     5.20 cm    RVOT Peak grad: 4 mmHg AV Vmax:           106.50 cm/s AV Vmean:          69.350 cm/s AV VTI:            0.153 m AV Peak Grad:      4.5 mmHg AV Mean Grad:      2.0 mmHg LVOT Vmax:         105.00 cm/s LVOT Vmean:        71.700 cm/s LVOT VTI:          0.191 m LVOT/AV VTI ratio: 1.25  AORTA Ao Root diam: 3.93 cm MITRAL VALVE               TRICUSPID VALVE MV Area (PHT): 4.68 cm    TR Peak grad:   29.8 mmHg MV Decel Time: 162 msec    TR Vmax:        273.00 cm/s MV E velocity: 48.00 cm/s MV A velocity: 73.30 cm/s  SHUNTS MV E/A ratio:  0.65        Systemic VTI:  0.19 m                            Systemic Diam: 2.30 cm Kathlyn Sacramento MD Electronically signed by Kathlyn Sacramento MD Signature Date/Time: 03/12/2021/11:05:50 AM    Final    DG FLUORO GUIDED LOC OF NEEDLE/CATH TIP FOR SPINAL INJECT LT  Result Date: 03/21/2021 CLINICAL DATA:  Patient with diffuse large B-cell lymphoma request received for diagnostic and therapeutic lumbar  puncture. EXAM: FLUOROSCOPICALLY GUIDED LUMBAR PUNCTURE FOR INTRATHECAL CHEMOTHERAPY AND DIAGNOSTIC LUMBAR PUNCTURE TECHNIQUE: Informed consent was obtained from the patient prior to the procedure, including potential complications of CSF leak requiring additional procedure, paresthesias, bleeding, infection, side effects from medication, seizures, headache, allergy, and pain. A 'time out' was  performed. With the patient prone, the lower back was prepped with Betadine. 1% Lidocaine was used for local anesthesia. Lumbar puncture was performed at the L4-L5 using a 20 gauge needle with return of clear CSF. A total of 9 mL of CSF fluid was obtained and sent to the laboratory for analysis. Opening pressure 15 cm of H2O. 12 mg of methotrexate was injected into the subarachnoid space. The patient tolerated the procedure well without apparent complication. A sterile dressing was applied. FLUOROSCOPY TIME:  0.2 minute, 2 images IMPRESSION: Technically successful fluoroscopic guided lumbar puncture. Intrathecal injection of chemotherapy without complication. Read By: Tsosie Billing PA-C Electronically Signed   By: Kathreen Devoid M.D.   On: 03/21/2021 13:03     Assessment and plan  1. 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[place])   2. Iron deficiency anemia due to chronic blood loss   3. Antineoplastic chemotherapy induced anemia   4. Neoplasm related pain   5. Hypercalcemia   6. Chemotherapy induced neutropenia ([facility])   7. Thrombophlebitis   8. Encounter for antineoplastic chemotherapy   Cancer Staging DLBCL (diffuse large B cell lymphoma) ([place]) Staging form: Hodgkin and Non-Hodgkin Lymphoma, AJCC 8th Edition - Clinical stage from 03/13/2021: Stage IV (Diffuse large B-cell lymphoma) - Signed by Earlie Server, MD on 03/15/2021  #Gastric mass status biopsy, splenic/adrenal involvement. MYC, BCL2, BCL6 FISH is negative.  Diffuse large B-cell lymphoma NOS Status post 1 cycle of R- CHOP with Day 2 IT- MTX with G-CSF. He tolerates with moderate difficulties. Labs are reviewed and discussed with patient Proceed with cycle 2 R-CHOP with day 2 intrathecal MTX with day 3 Udenyca. Plan to submit CSF for flow cytometry. Continue allopurinol.   #Chemotherapy-induced anemia/iron deficiency anemia. Status post multiple units of PRBC transfusion. Continue close monitor.  Hemoglobin is  8.2 today  #Chemotherapy-induced neutropenia.  Patient has received G-CSF support.  ANC has recovered.  Off prophylactic antibiotics Cipro  # Nausea and vomiting, continue Zofran and Compazine PRN.  Hold Phenergan suppository when he is neutropenic.Marland Kitchen  Overall his symptom has improved.  # Abdominal pain, did not tolerate fentanyl patch.  Continue Oxycodone 31m Q4 hours as needed.  Pain has improved.   # baseline echo showed normal LVEF. Grade 1 diastolic dysfunction. Will refer him to cardiology to establish care.  #renal impairment, his kidney function has improved.  Encourage oral hydration.  # IDA, hemoglobin is improving.  Status post multiple IV Venofer treatments.  # Hypercalcemia, probably due to AKI, dehydration.  S/p Zometa x 1. Calcium has decreased.  #Malnutrition, albumin 2.9.  Follow-up with nutritionist.  He feels the nutrition supplements is too sweet and he is making his own protein shakes  #Left arm thrombophlebitis continue warm compression #CDC recommendation of influenza vaccination and COVID-19 vaccination were discussed with patient.  He declined.  Follow-up  Day 3 Udenyca, hold tube CBC, +/- IV Venofer +/- PRBC transfusion Weekly lab MD CBC  +/- PRBC transfusion Lab MD 3 weeks cycle 3 R-CHOP/day 2 intrathecal MTX/day 3 USula Rumple MD, PhD  04/10/2021

## 2021-04-10 NOTE — Patient Instructions (Signed)
[facility]  Discharge Instructions: Thank you for choosing [facility] to provide your oncology and hematology care.  If you have a lab appointment with the [facility], please go directly to the [facility] and check in at the registration area.  Wear comfortable clothing and clothing appropriate for easy access to any Portacath or PICC line.   We strive to give you quality time with your provider. You may need to reschedule your appointment if you arrive late (15 or more minutes).  Arriving late affects you and other patients whose appointments are after yours.  Also, if you miss three or more appointments without notifying the office, you may be dismissed from the clinic at the provider's discretion.      For prescription refill requests, have your pharmacy contact our office and allow 72 hours for refills to be completed.    Today you received the following chemotherapy and/or immunotherapy agents Adriamycin, Vincristine, Cytoxan, Ruxience       To help prevent nausea and vomiting after your treatment, we encourage you to take your nausea medication as directed.  BELOW ARE SYMPTOMS THAT SHOULD BE REPORTED IMMEDIATELY: *FEVER GREATER THAN 100.4 F (38 C) OR HIGHER *CHILLS OR SWEATING *NAUSEA AND VOMITING THAT IS NOT CONTROLLED WITH YOUR NAUSEA MEDICATION *UNUSUAL SHORTNESS OF BREATH *UNUSUAL BRUISING OR BLEEDING *URINARY PROBLEMS (pain or burning when urinating, or frequent urination) *BOWEL PROBLEMS (unusual diarrhea, constipation, pain near the anus) TENDERNESS IN MOUTH AND THROAT WITH OR WITHOUT PRESENCE OF ULCERS (sore throat, sores in mouth, or a toothache) UNUSUAL RASH, SWELLING OR PAIN  UNUSUAL VAGINAL DISCHARGE OR ITCHING   Items with * indicate a potential emergency and should be followed up as soon as possible or go to the Emergency Department if any problems should occur.  Please show the CHEMOTHERAPY ALERT CARD or  IMMUNOTHERAPY ALERT CARD at check-in to the Emergency Department and triage nurse.  Should you have questions after your visit or need to cancel or reschedule your appointment, please contact [facility]  [phone] and follow the prompts.  Office hours are 8:00 a.m. to 4:30 p.m. Monday - Friday. Please note that voicemails left after 4:00 p.m. may not be returned until the following business day.  We are closed weekends and major holidays. You have access to a nurse at all times for urgent questions. Please call the main number to the clinic [phone] and follow the prompts.  For any non-urgent questions, you may also contact your provider using MyChart. We now offer e-Visits for anyone 47 and older to request care online for non-urgent symptoms. For details visit [email or web address].   Also download the MyChart app! Go to the app store, search "MyChart", open the app, select [facility], and log in with your MyChart username and password.  Due to Covid, a mask is required upon entering the hospital/clinic. If you do not have a mask, one will be given to you upon arrival. For doctor visits, patients may have 1 support person aged 26 or older with them. For treatment visits, patients cannot have anyone with them due to current Covid guidelines and our immunocompromised population.

## 2021-04-10 NOTE — Progress Notes (Signed)
Patient her for follow up. No new concerns voiced.

## 2021-04-11 ENCOUNTER — Ambulatory Visit
Admission: RE | Admit: 2021-04-11 | Discharge: 2021-04-11 | Disposition: A | Discharge status: Home / Self Care | Payer: BC Managed Care – PPO | Source: Ambulatory Visit | Attending: Oncology | Admitting: Oncology

## 2021-04-11 ENCOUNTER — Inpatient Hospital Stay: Payer: BC Managed Care – PPO

## 2021-04-11 VITALS — BP 132/85 | HR 70 | Temp 98.3°F | Resp 15

## 2021-04-11 DIAGNOSIS — C8338 Diffuse large B-cell lymphoma, lymph nodes of multiple sites: Secondary | ICD-10-CM | POA: Insufficient documentation

## 2021-04-11 MED ORDER — ALLOPURINOL 100 MG PO TABS: 100.0000 mg | ORAL_TABLET | Freq: Every day | ORAL | 0 refills | Status: AC

## 2021-04-11 MED ORDER — SODIUM CHLORIDE (PF) 0.9 % IJ SOLN
Freq: Once | INTRAMUSCULAR | Status: AC
  Filled 2021-04-11: qty 0.48

## 2021-04-11 NOTE — Procedures (Signed)
PROCEDURE SUMMARY:  Successful fluoroscopic guided therapeutic lumbar puncture.   Yielded 3 mL of colorless CSF fluid.  Successful intrathecal injection of Methotrexate.  No immediate complications.  Pt tolerated well.   EBL < 54mL  Rockney Ghee 04/11/2021 9:44 AM

## 2021-04-11 NOTE — Progress Notes (Incomplete)
Pt handed off to [employer].

## 2021-04-11 NOTE — Addendum Note (Signed)
Addended by: Earlie Server on: 04/11/2021 04:09 PM   Modules accepted: Orders

## 2021-04-11 NOTE — Telephone Encounter (Signed)
Are you ok with refilling allopurinol, if so, pt requesting 90day supply

## 2021-04-12 ENCOUNTER — Inpatient Hospital Stay: Payer: BC Managed Care – PPO

## 2021-04-12 ENCOUNTER — Other Ambulatory Visit: Payer: Self-pay | Admitting: Oncology

## 2021-04-12 ENCOUNTER — Other Ambulatory Visit: Payer: Self-pay

## 2021-04-12 VITALS — BP 136/80 | HR 67 | Temp 96.0°F | Resp 16

## 2021-04-12 DIAGNOSIS — C8338 Diffuse large B-cell lymphoma, lymph nodes of multiple sites: Secondary | ICD-10-CM

## 2021-04-12 DIAGNOSIS — D649 Anemia, unspecified: Secondary | ICD-10-CM

## 2021-04-12 DIAGNOSIS — Z5112 Encounter for antineoplastic immunotherapy: Secondary | ICD-10-CM | POA: Diagnosis not present

## 2021-04-12 LAB — CBC WITH DIFFERENTIAL/PLATELET
Abs Immature Granulocytes: 0.12 10*3/uL — ABNORMAL HIGH (ref 0.00–0.07)
Basophils Absolute: 0 10*3/uL (ref 0.0–0.1)
Basophils Relative: 0 %
Eosinophils Absolute: 0 10*3/uL (ref 0.0–0.5)
Eosinophils Relative: 0 %
HCT: 22.5 % — ABNORMAL LOW (ref 39.0–52.0)
Hemoglobin: 7.2 g/dL — ABNORMAL LOW (ref 13.0–17.0)
Immature Granulocytes: 1 %
Lymphocytes Relative: 4 %
Lymphs Abs: 0.6 10*3/uL — ABNORMAL LOW (ref 0.7–4.0)
MCH: 26.5 pg (ref 26.0–34.0)
MCHC: 32 g/dL (ref 30.0–36.0)
MCV: 82.7 fL (ref 80.0–100.0)
Monocytes Absolute: 1 10*3/uL (ref 0.1–1.0)
Monocytes Relative: 7 %
Neutro Abs: 12.2 10*3/uL — ABNORMAL HIGH (ref 1.7–7.7)
Neutrophils Relative %: 88 %
Platelets: 359 10*3/uL (ref 150–400)
RBC: 2.72 MIL/uL — ABNORMAL LOW (ref 4.22–5.81)
RDW: 21.7 % — ABNORMAL HIGH (ref 11.5–15.5)
WBC: 13.8 10*3/uL — ABNORMAL HIGH (ref 4.0–10.5)
nRBC: 0 % (ref 0.0–0.2)

## 2021-04-12 LAB — COMPREHENSIVE METABOLIC PANEL
ALT: 30 U/L (ref 0–44)
AST: 19 U/L (ref 15–41)
Albumin: 2.9 g/dL — ABNORMAL LOW (ref 3.5–5.0)
Alkaline Phosphatase: 83 U/L (ref 38–126)
Anion gap: 8 (ref 5–15)
BUN: 21 mg/dL — ABNORMAL HIGH (ref 6–20)
CO2: 24 mmol/L (ref 22–32)
Calcium: 7.2 mg/dL — ABNORMAL LOW (ref 8.9–10.3)
Chloride: 104 mmol/L (ref 98–111)
Creatinine, Ser: 0.88 mg/dL (ref 0.61–1.24)
GFR, Estimated: >60 mL/min (ref >60)
Glucose, Bld: 149 mg/dL — ABNORMAL HIGH (ref 70–99)
Potassium: 3.4 mmol/L — ABNORMAL LOW (ref 3.5–5.1)
Sodium: 136 mmol/L (ref 135–145)
Total Bilirubin: 0.5 mg/dL (ref 0.3–1.2)
Total Protein: 6.1 g/dL — ABNORMAL LOW (ref 6.5–8.1)

## 2021-04-12 LAB — PREPARE RBC (CROSSMATCH)

## 2021-04-12 MED ORDER — HEPARIN SOD (PORK) LOCK FLUSH 100 UNIT/ML IV SOLN
INTRAVENOUS | Status: AC
  Filled 2021-04-12: qty 5

## 2021-04-12 MED ORDER — DIPHENHYDRAMINE HCL 25 MG PO CAPS
25.0000 mg | ORAL_CAPSULE | Freq: Once | ORAL | Status: AC
  Administered 2021-04-12: 25 mg via ORAL
  Filled 2021-04-12: qty 1

## 2021-04-12 MED ORDER — SODIUM CHLORIDE 0.9% IV SOLUTION
250.0000 mL | Freq: Once | INTRAVENOUS | Status: AC
  Administered 2021-04-12: 250 mL via INTRAVENOUS
  Filled 2021-04-12: qty 250

## 2021-04-12 MED ORDER — HEPARIN SOD (PORK) LOCK FLUSH 100 UNIT/ML IV SOLN
250.0000 [IU] | INTRAVENOUS | Status: DC | PRN
  Filled 2021-04-12: qty 5

## 2021-04-12 MED ORDER — PEGFILGRASTIM-CBQV 6 MG/0.6ML ~~LOC~~ SOSY
6.0000 mg | PREFILLED_SYRINGE | Freq: Once | SUBCUTANEOUS | Status: AC
  Administered 2021-04-12: 6 mg via SUBCUTANEOUS
  Filled 2021-04-12: qty 0.6

## 2021-04-12 MED ORDER — ACETAMINOPHEN 325 MG PO TABS
650.0000 mg | ORAL_TABLET | Freq: Once | ORAL | Status: AC
  Administered 2021-04-12: 650 mg via ORAL
  Filled 2021-04-12: qty 2

## 2021-04-12 MED ORDER — SODIUM CHLORIDE 0.9% FLUSH
3.0000 mL | INTRAVENOUS | Status: DC | PRN
  Filled 2021-04-12: qty 3

## 2021-04-12 NOTE — Progress Notes (Signed)
Nutrition  Patient infusion completed early and patient ready to go.  RD unable to see patient today.  Will reschedule to 11/2 during next infusion.   Zachary Banas B. Zachary Perkins, [place], [place] Registered Dietitian [phone] (mobile)

## 2021-04-13 ENCOUNTER — Inpatient Hospital Stay: Payer: BC Managed Care – PPO

## 2021-04-13 LAB — BPAM RBC
Blood Product Expiration Date: 202211072359
ISSUE DATE / TIME: 202210270956
Unit Type and Rh: 9500

## 2021-04-13 LAB — TYPE AND SCREEN
ABO/RH(D): O POS
Antibody Screen: NEGATIVE
Unit division: 0

## 2021-04-16 ENCOUNTER — Telehealth: Payer: Self-pay | Admitting: *Deleted

## 2021-04-16 NOTE — Telephone Encounter (Signed)
Patient called and left message that he is light headed and short of breath I see that he has an appointment for transfusion 04/18/21. He is asking if he can come in tomorrow for transfusion. Please advise

## 2021-04-17 ENCOUNTER — Ambulatory Visit: Payer: BC Managed Care – PPO | Admitting: Nurse Practitioner

## 2021-04-17 ENCOUNTER — Other Ambulatory Visit: Payer: Self-pay

## 2021-04-17 ENCOUNTER — Emergency Department
Admission: EM | Admit: 2021-04-17 | Discharge: 2021-04-17 | Disposition: A | Discharge status: Home / Self Care | Payer: BC Managed Care – PPO | Attending: Student in an Organized Health Care Education/Training Program | Admitting: Student in an Organized Health Care Education/Training Program

## 2021-04-17 ENCOUNTER — Other Ambulatory Visit: Payer: BC Managed Care – PPO

## 2021-04-17 ENCOUNTER — Inpatient Hospital Stay (HOSPITAL_BASED_OUTPATIENT_CLINIC_OR_DEPARTMENT_OTHER): Payer: BC Managed Care – PPO | Admitting: Nurse Practitioner

## 2021-04-17 ENCOUNTER — Encounter: Payer: Self-pay | Admitting: Emergency Medicine

## 2021-04-17 ENCOUNTER — Inpatient Hospital Stay: Payer: BC Managed Care – PPO

## 2021-04-17 ENCOUNTER — Inpatient Hospital Stay: Payer: BC Managed Care – PPO | Attending: Oncology

## 2021-04-17 VITALS — BP 118/77 | HR 85 | Temp 97.9°F | Resp 18

## 2021-04-17 VITALS — BP 119/81 | HR 92 | Temp 98.7°F | Resp 18 | Wt 270.0 lb

## 2021-04-17 DIAGNOSIS — I10 Essential (primary) hypertension: Secondary | ICD-10-CM | POA: Insufficient documentation

## 2021-04-17 DIAGNOSIS — Z859 Personal history of malignant neoplasm, unspecified: Secondary | ICD-10-CM | POA: Diagnosis not present

## 2021-04-17 DIAGNOSIS — Z5321 Procedure and treatment not carried out due to patient leaving prior to being seen by health care provider: Secondary | ICD-10-CM | POA: Insufficient documentation

## 2021-04-17 DIAGNOSIS — R509 Fever, unspecified: Secondary | ICD-10-CM | POA: Diagnosis not present

## 2021-04-17 DIAGNOSIS — C8338 Diffuse large B-cell lymphoma, lymph nodes of multiple sites: Secondary | ICD-10-CM

## 2021-04-17 DIAGNOSIS — R110 Nausea: Secondary | ICD-10-CM | POA: Insufficient documentation

## 2021-04-17 DIAGNOSIS — E119 Type 2 diabetes mellitus without complications: Secondary | ICD-10-CM | POA: Insufficient documentation

## 2021-04-17 DIAGNOSIS — R109 Unspecified abdominal pain: Secondary | ICD-10-CM | POA: Insufficient documentation

## 2021-04-17 DIAGNOSIS — C8330 Diffuse large B-cell lymphoma, unspecified site: Secondary | ICD-10-CM

## 2021-04-17 DIAGNOSIS — E46 Unspecified protein-calorie malnutrition: Secondary | ICD-10-CM | POA: Insufficient documentation

## 2021-04-17 DIAGNOSIS — Z87891 Personal history of nicotine dependence: Secondary | ICD-10-CM | POA: Insufficient documentation

## 2021-04-17 DIAGNOSIS — D702 Other drug-induced agranulocytosis: Secondary | ICD-10-CM | POA: Insufficient documentation

## 2021-04-17 DIAGNOSIS — D649 Anemia, unspecified: Secondary | ICD-10-CM

## 2021-04-17 DIAGNOSIS — G893 Neoplasm related pain (acute) (chronic): Secondary | ICD-10-CM | POA: Insufficient documentation

## 2021-04-17 DIAGNOSIS — T451X5A Adverse effect of antineoplastic and immunosuppressive drugs, initial encounter: Secondary | ICD-10-CM

## 2021-04-17 DIAGNOSIS — D701 Agranulocytosis secondary to cancer chemotherapy: Secondary | ICD-10-CM

## 2021-04-17 DIAGNOSIS — D500 Iron deficiency anemia secondary to blood loss (chronic): Secondary | ICD-10-CM

## 2021-04-17 DIAGNOSIS — D6481 Anemia due to antineoplastic chemotherapy: Secondary | ICD-10-CM | POA: Insufficient documentation

## 2021-04-17 DIAGNOSIS — Z801 Family history of malignant neoplasm of trachea, bronchus and lung: Secondary | ICD-10-CM | POA: Insufficient documentation

## 2021-04-17 LAB — CBC WITH DIFFERENTIAL/PLATELET
Abs Immature Granulocytes: 0.01 10*3/uL (ref 0.00–0.07)
Abs Immature Granulocytes: 0 10*3/uL (ref 0.00–0.07)
Basophils Absolute: 0 10*3/uL (ref 0.0–0.1)
Basophils Absolute: 0 10*3/uL (ref 0.0–0.1)
Basophils Relative: 1 %
Basophils Relative: 2 %
Eosinophils Absolute: 0 10*3/uL (ref 0.0–0.5)
Eosinophils Absolute: 0 10*3/uL (ref 0.0–0.5)
Eosinophils Relative: 1 %
Eosinophils Relative: 2 %
HCT: 18.1 % — ABNORMAL LOW (ref 39.0–52.0)
HCT: 21.2 % — ABNORMAL LOW (ref 39.0–52.0)
Hemoglobin: 5.7 g/dL — ABNORMAL LOW (ref 13.0–17.0)
Hemoglobin: 7.3 g/dL — ABNORMAL LOW (ref 13.0–17.0)
Immature Granulocytes: 1 %
Immature Granulocytes: 0 %
Lymphocytes Relative: 27 %
Lymphocytes Relative: 45 %
Lymphs Abs: 0.2 10*3/uL — ABNORMAL LOW (ref 0.7–4.0)
Lymphs Abs: 0.3 10*3/uL — ABNORMAL LOW (ref 0.7–4.0)
MCH: 26 pg (ref 26.0–34.0)
MCH: 28.1 pg (ref 26.0–34.0)
MCHC: 31.5 g/dL (ref 30.0–36.0)
MCHC: 34.4 g/dL (ref 30.0–36.0)
MCV: 82.6 fL (ref 80.0–100.0)
MCV: 81.5 fL (ref 80.0–100.0)
Monocytes Absolute: 0.1 10*3/uL (ref 0.1–1.0)
Monocytes Absolute: 0.2 10*3/uL (ref 0.1–1.0)
Monocytes Relative: 12 %
Monocytes Relative: 28 %
Neutro Abs: 0.5 10*3/uL — ABNORMAL LOW (ref 1.7–7.7)
Neutro Abs: 0.2 10*3/uL — CL (ref 1.7–7.7)
Neutrophils Relative %: 58 %
Neutrophils Relative %: 23 %
Platelets: 185 10*3/uL (ref 150–400)
Platelets: 189 10*3/uL (ref 150–400)
RBC: 2.19 MIL/uL — ABNORMAL LOW (ref 4.22–5.81)
RBC: 2.6 MIL/uL — ABNORMAL LOW (ref 4.22–5.81)
RDW: 21.1 % — ABNORMAL HIGH (ref 11.5–15.5)
RDW: 19.7 % — ABNORMAL HIGH (ref 11.5–15.5)
Smear Review: NORMAL
WBC: 0.8 10*3/uL — CL (ref 4.0–10.5)
WBC: 0.7 10*3/uL — CL (ref 4.0–10.5)
nRBC: 0 % (ref 0.0–0.2)
nRBC: 0 % (ref 0.0–0.2)

## 2021-04-17 LAB — COMPREHENSIVE METABOLIC PANEL WITH GFR
ALT: 15 U/L (ref 0–44)
AST: 13 U/L — ABNORMAL LOW (ref 15–41)
Albumin: 3.1 g/dL — ABNORMAL LOW (ref 3.5–5.0)
Alkaline Phosphatase: 88 U/L (ref 38–126)
Anion gap: 8 (ref 5–15)
BUN: 19 mg/dL (ref 6–20)
CO2: 25 mmol/L (ref 22–32)
Calcium: 8.3 mg/dL — ABNORMAL LOW (ref 8.9–10.3)
Chloride: 101 mmol/L (ref 98–111)
Creatinine, Ser: 0.75 mg/dL (ref 0.61–1.24)
GFR, Estimated: >60 mL/min
Glucose, Bld: 130 mg/dL — ABNORMAL HIGH (ref 70–99)
Potassium: 3.8 mmol/L (ref 3.5–5.1)
Sodium: 134 mmol/L — ABNORMAL LOW (ref 135–145)
Total Bilirubin: 1 mg/dL (ref 0.3–1.2)
Total Protein: 6.3 g/dL — ABNORMAL LOW (ref 6.5–8.1)

## 2021-04-17 LAB — COMPREHENSIVE METABOLIC PANEL
ALT: 15 U/L (ref 0–44)
AST: 15 U/L (ref 15–41)
Albumin: 3 g/dL — ABNORMAL LOW (ref 3.5–5.0)
Alkaline Phosphatase: 101 U/L (ref 38–126)
Anion gap: 10 (ref 5–15)
BUN: 24 mg/dL — ABNORMAL HIGH (ref 6–20)
CO2: 25 mmol/L (ref 22–32)
Calcium: 8.1 mg/dL — ABNORMAL LOW (ref 8.9–10.3)
Chloride: 99 mmol/L (ref 98–111)
Creatinine, Ser: 0.77 mg/dL (ref 0.61–1.24)
GFR, Estimated: >60 mL/min (ref >60)
Glucose, Bld: 141 mg/dL — ABNORMAL HIGH (ref 70–99)
Potassium: 3.6 mmol/L (ref 3.5–5.1)
Sodium: 134 mmol/L — ABNORMAL LOW (ref 135–145)
Total Bilirubin: 0.4 mg/dL (ref 0.3–1.2)
Total Protein: 6 g/dL — ABNORMAL LOW (ref 6.5–8.1)

## 2021-04-17 LAB — SAMPLE TO BLOOD BANK

## 2021-04-17 LAB — PREPARE RBC (CROSSMATCH)

## 2021-04-17 LAB — LACTATE DEHYDROGENASE: LDH: 108 U/L (ref 98–192)

## 2021-04-17 LAB — LIPASE, BLOOD: Lipase: 19 U/L (ref 11–51)

## 2021-04-17 MED ORDER — SODIUM CHLORIDE 0.9 % IV SOLN: Freq: Once | INTRAVENOUS | Status: DC

## 2021-04-17 MED ORDER — SODIUM CHLORIDE 0.9% FLUSH
3.0000 mL | INTRAVENOUS | Status: DC | PRN
  Filled 2021-04-17: qty 3

## 2021-04-17 MED ORDER — DIPHENHYDRAMINE HCL 25 MG PO CAPS
25.0000 mg | ORAL_CAPSULE | Freq: Once | ORAL | Status: AC
  Administered 2021-04-17: 25 mg via ORAL
  Filled 2021-04-17: qty 1

## 2021-04-17 MED ORDER — CIPROFLOXACIN HCL 500 MG PO TABS: 500.0000 mg | ORAL_TABLET | Freq: Two times a day (BID) | ORAL | 0 refills | Status: AC

## 2021-04-17 MED ORDER — ONDANSETRON HCL 4 MG/2ML IJ SOLN
8.0000 mg | Freq: Once | INTRAMUSCULAR | Status: AC
  Administered 2021-04-17: 8 mg via INTRAVENOUS
  Filled 2021-04-17: qty 4

## 2021-04-17 MED ORDER — ACETAMINOPHEN 325 MG PO TABS
650.0000 mg | ORAL_TABLET | Freq: Once | ORAL | Status: AC
  Administered 2021-04-17: 650 mg via ORAL
  Filled 2021-04-17: qty 2

## 2021-04-17 MED ORDER — HEPARIN SOD (PORK) LOCK FLUSH 100 UNIT/ML IV SOLN
INTRAVENOUS | Status: AC
  Filled 2021-04-17: qty 5

## 2021-04-17 MED ORDER — POLYETHYLENE GLYCOL 3350 17 G PO PACK: PACK | ORAL | Status: AC

## 2021-04-17 MED ORDER — SODIUM CHLORIDE 0.9% IV SOLUTION
250.0000 mL | Freq: Once | INTRAVENOUS | Status: AC
  Administered 2021-04-17: 250 mL via INTRAVENOUS
  Filled 2021-04-17: qty 250

## 2021-04-17 NOTE — Progress Notes (Signed)
Symptom Management [facility]  [phone] [phone]  Patient Care Team: Sharyne Peach, MD as PCP - General (Family Medicine) Earlie Server, MD as Consulting Physician (Hematology and Oncology)   Name of the patient: Zachary Perkins  [id number]  1960/07/16   Date of visit: 04/17/21  Diagnosis-diffuse large B-cell lymphoma  Chief complaint/ Reason for visit-weakness  Heme/Onc history:  Oncology History  DLBCL (diffuse large B cell lymphoma) ([place])  03/13/2021 Cancer Staging   Staging form: Hodgkin and Non-Hodgkin Lymphoma, AJCC 8th Edition - Clinical stage from 03/13/2021: Stage IV (Diffuse large B-cell lymphoma) - Signed by Earlie Server, MD on 03/15/2021 Stage prefix: Initial diagnosis    03/14/2021 Initial Diagnosis   DLBCL (diffuse large B cell lymphoma) ([place])   03/20/2021 -  Chemotherapy   Patient is on Treatment Plan : NON-HODGKINS LYMPHOMA R-CHOP q21d       Interval history-patient is 60 year old male currently undergoing treatment for diffuse large B-cell lymphoma who presents to symptom management clinic for weakness and possible transfusion.  He received cycle 2 of R-CHOP on 04/10/2021 followed by intrathecal methotrexate on 04/11/2021 and Udenyca support on 04/12/2021.  Treatment has been complicated by neutropenia and anemia.  He has received multiple doses of IV Venofer and blood transfusions.  WBCs recently improved and prophylactic Cipro was discontinued.  No fevers or chills.  Continues to have mild nausea unrelieved by Zofran and Compazine.  Appetite is stable.  Tolerating oral hydration moderately.  No chest pain.  Shortness of breath with exertion.  He continues to ambulate without aids however.  Has fluctuating diarrhea and constipation.  Takes MiraLAX daily.  No urinary complaints.  Mild dizziness when changing positions.  No falls.  Review of systems- Review of Systems  Constitutional:  Positive for malaise/fatigue.  Negative for chills, fever and weight loss.  HENT:  Negative for hearing loss, nosebleeds, sore throat and tinnitus.   Eyes:  Negative for blurred vision and double vision.  Respiratory:  Positive for shortness of breath. Negative for cough, hemoptysis and wheezing.   Cardiovascular:  Negative for chest pain, palpitations, orthopnea and leg swelling.  Gastrointestinal:  Positive for constipation, diarrhea and nausea. Negative for abdominal pain, blood in stool, melena and vomiting.  Genitourinary:  Negative for dysuria and urgency.  Musculoskeletal:  Negative for back pain, falls, joint pain and myalgias.  Skin:  Negative for itching and rash.  Neurological:  Positive for dizziness and weakness. Negative for tingling, sensory change, loss of consciousness and headaches.  Endo/Heme/Allergies:  Negative for environmental allergies. Does not bruise/bleed easily.  Psychiatric/Behavioral:  Negative for depression. The patient is not nervous/anxious and does not have insomnia.      Allergies  Allergen Reactions   Lisinopril Cough   Losartan Diarrhea   Metformin And Related     PASSED OUT    Past Medical History:  Diagnosis Date   Diabetes mellitus without complication ([place])    Diffuse large B cell lymphoma ([facility])    Hypertension    Hypothyroidism    Morbid obesity ([place])     Past Surgical History:  Procedure Laterality Date   CARPAL TUNNEL RELEASE Left    ESOPHAGOGASTRODUODENOSCOPY (EGD) WITH PROPOFOL N/A 02/21/2021   Procedure: ESOPHAGOGASTRODUODENOSCOPY (EGD) WITH PROPOFOL;  Surgeon: Annamaria Helling, DO;  Location: [facility] ENDOSCOPY;  Service: Gastroenterology;  Laterality: N/A;  after 2pm   INSERTION OF MESH N/A 05/02/2017   Procedure: INSERTION OF MESH;  Surgeon: Leonie Green, MD;  Location: [facility]  ORS;  Service: General;  Laterality: N/A;   lasix eye surgery     PORTACATH PLACEMENT Right 03/09/2021   Procedure: INSERTION PORT-A-CATH;  Surgeon: Jules Husbands, MD;   Location: [facility] ORS;  Service: General;  Laterality: Right;   UMBILICAL HERNIA REPAIR N/A 05/02/2017   Procedure: HERNIA REPAIR UMBILICAL ADULT;  Surgeon: Leonie Green, MD;  Location: [facility] ORS;  Service: General;  Laterality: N/A;    Social History   Socioeconomic History   Marital status: Married    Spouse name: Melonie   Number of children: 0   Years of education: 12   Highest education level: 12th grade  Occupational History   Not on file  Tobacco Use   Smoking status: Former    Packs/day: 1.00    Years: 20.00    Pack years: 20.00    Types: Cigarettes    Quit date: 04/25/2007    Years since quitting: 13.9   Smokeless tobacco: Never  Vaping Use   Vaping Use: Never used  Substance and Sexual Activity   Alcohol use: Not Currently    Comment: rare   Drug use: No   Sexual activity: Yes  Other Topics Concern   Not on file  Social History Narrative   Not on file   Social Determinants of Health   Financial Resource Strain: Not on file  Food Insecurity: Not on file  Transportation Needs: Not on file  Physical Activity: Not on file  Stress: Not on file  Social Connections: Not on file  Intimate Partner Violence: Not on file    Family History  Problem Relation Age of Onset   COPD Father    Emphysema Father    Cancer - Lung Brother      Current Outpatient Medications:    allopurinol (ZYLOPRIM) 100 MG tablet, Take 1 tablet (100 mg total) by mouth daily., Disp: 90 tablet, Rfl: 0   bisacodyl (DULCOLAX) 10 MG suppository, Place 1 suppository (10 mg total) rectally daily as needed for severe constipation., Disp: 12 suppository, Rfl: 0   docusate sodium (COLACE) 100 MG capsule, Take 200 mg by mouth daily., Disp: , Rfl:    fentaNYL (DURAGESIC) 25 MCG/HR, Place 1 patch onto the skin every 3 (three) days. (Patient not taking: No sig reported), Disp: 5 patch, Rfl: 0   gabapentin (NEURONTIN) 100 MG capsule, Take 1 capsule (100 mg total) by mouth 3 (three) times daily.  (Patient taking differently: Take 100 mg by mouth 2 (two) times daily.), Disp: 90 capsule, Rfl: 2   hydrochlorothiazide (HYDRODIURIL) 25 MG tablet, Take 25 mg by mouth daily. (Patient not taking: Reported on 04/10/2021), Disp: , Rfl:    levothyroxine (SYNTHROID) 300 MCG tablet, Take 300 mcg by mouth daily., Disp: , Rfl:    Lidocaine, Anorectal, (HEMORRHOIDAL RELIEF) 5 % CREA, Apply 1 g topically every 8 (eight) hours as needed., Disp: 45 g, Rfl: 0   lidocaine-prilocaine (EMLA) cream, Apply to affected area once, Disp: 30 g, Rfl: 3   loratadine (CLARITIN) 10 MG tablet, Take 10 mg by mouth daily. With GCSF injections, Disp: , Rfl:    Multiple Vitamin (MULTI-VITAMIN) tablet, Take 1 tablet by mouth daily., Disp: , Rfl:    ondansetron (ZOFRAN) 8 MG tablet, Take 1 tablet (8 mg total) by mouth every 8 (eight) hours as needed for nausea or vomiting., Disp: 90 tablet, Rfl: 1   oxyCODONE (OXY IR/ROXICODONE) 5 MG immediate release tablet, Take 1 tablet (5 mg total) by mouth every 4 (four) hours  as needed for moderate pain., Disp: 60 tablet, Rfl: 0   pantoprazole (PROTONIX) 40 MG tablet, Take 1 tablet (40 mg total) by mouth 2 (two) times daily before a meal., Disp: 180 tablet, Rfl: 0   polyethylene glycol (MIRALAX / GLYCOLAX) 17 g packet, Take 17 g by mouth daily as needed for moderate constipation. (Patient taking differently: Take 17 g by mouth daily.), Disp: 14 each, Rfl: 0   potassium chloride (KLOR-CON) 10 MEQ tablet, Take 10 mEq by mouth daily., Disp: , Rfl:    predniSONE (DELTASONE) 20 MG tablet, Take 5 tablets (100 mg total) by mouth daily. Take with food on days 2-5 of chemotherapy., Disp: 25 tablet, Rfl: 4   prochlorperazine (COMPAZINE) 10 MG tablet, Take 1 tablet (10 mg total) by mouth every 6 (six) hours as needed for nausea or vomiting., Disp: 90 tablet, Rfl: 1   promethazine (PHENERGAN) 25 MG suppository, Place 1 suppository (25 mg total) rectally every 6 (six) hours as needed for nausea or  vomiting., Disp: 24 each, Rfl: 0 No current facility-administered medications for this visit.  Facility-Administered Medications Ordered in Other Visits:    ondansetron (ZOFRAN) injection 8 mg, 8 mg, Intravenous, Once, Earlie Server, MD  Physical exam:  Vitals:   04/17/21 0842  BP: 119/81  Pulse: 92  Resp: 18  Temp: 98.7 F (37.1 C)  TempSrc: Tympanic  SpO2: 100%  Weight: 270 lb (122.5 kg)   Physical Exam Constitutional:      General: He is not in acute distress.    Appearance: He is well-developed. He is ill-appearing.     Comments: Accompanied by wife  HENT:     Head: Normocephalic and atraumatic.  Eyes:     General: No scleral icterus. Cardiovascular:     Rate and Rhythm: Normal rate and regular rhythm.     Heart sounds: Normal heart sounds.  Pulmonary:     Effort: No respiratory distress.     Breath sounds: No wheezing.  Abdominal:     Tenderness: There is no guarding.  Musculoskeletal:     Comments: ambulatory  Skin:    General: Skin is dry.     Coloration: Skin is pale. Skin is not jaundiced.  Neurological:     Mental Status: He is alert and oriented to person, place, and time.  Psychiatric:        Behavior: Behavior normal.     CMP Latest Ref Rng & Units 04/17/2021  Glucose 70 - 99 mg/dL 141(H)  BUN 6 - 20 mg/dL 24(H)  Creatinine 0.61 - 1.24 mg/dL 0.77  Sodium 135 - 145 mmol/L 134(L)  Potassium 3.5 - 5.1 mmol/L 3.6  Chloride 98 - 111 mmol/L 99  CO2 22 - 32 mmol/L 25  Calcium 8.9 - 10.3 mg/dL 8.1(L)  Total Protein 6.5 - 8.1 g/dL 6.0(L)  Total Bilirubin 0.3 - 1.2 mg/dL 0.4  Alkaline Phos 38 - 126 U/L 101  AST 15 - 41 U/L 15  ALT 0 - 44 U/L 15   CBC Latest Ref Rng & Units 04/17/2021  WBC 4.0 - 10.5 K/uL 0.8(LL)  Hemoglobin 13.0 - 17.0 g/dL 5.7(L)  Hematocrit 39.0 - 52.0 % 18.1(L)  Platelets 150 - 400 K/uL 185    No images are attached to the encounter.  US Venous Img Upper Uni Left  Result Date: 04/03/2021 CLINICAL DATA:  Left upper extremity pain  and edema for the past 2 days. Evaluate for DVT. EXAM: LEFT UPPER EXTREMITY VENOUS DOPPLER ULTRASOUND TECHNIQUE: Gray-scale sonography  with graded compression, as well as color Doppler and duplex ultrasound were performed to evaluate the upper extremity deep venous system from the level of the subclavian vein and including the jugular, axillary, basilic, radial, ulnar and upper cephalic vein. Spectral Doppler was utilized to evaluate flow at rest and with distal augmentation maneuvers. COMPARISON:  None. FINDINGS: Contralateral Subclavian Vein: Respiratory phasicity is normal and symmetric with the symptomatic side. No evidence of thrombus. Normal compressibility. Internal Jugular Vein: No evidence of thrombus. Normal compressibility, respiratory phasicity and response to augmentation. Subclavian Vein: No evidence of thrombus. Normal compressibility, respiratory phasicity and response to augmentation. Axillary Vein: No evidence of thrombus. Normal compressibility, respiratory phasicity and response to augmentation. Cephalic Vein: While the cephalic vein appears patent centrally (image 19), there is hypoechoic occlusive thrombus within the cephalic vein at the level of the distal humerus (image 17; 26 through 29)) extending to the level of the proximal forearm (image 18). Basilic Vein: No evidence of thrombus. Normal compressibility, respiratory phasicity and response to augmentation. Brachial Veins: No evidence of thrombus. Normal compressibility, respiratory phasicity and response to augmentation. Radial Veins: No evidence of thrombus. Normal compressibility, respiratory phasicity and response to augmentation. Ulnar Veins: No evidence of thrombus. Normal compressibility, respiratory phasicity and response to augmentation. Other Findings:  None visualized. IMPRESSION: 1. No evidence of DVT within the left upper extremity. 2. Examination is positive for occlusive superficial thrombophlebitis involving the cephalic  vein extending from the level of the distal humerus to the proximal forearm. Note, the occluded segment of the cephalic vein at the level of the forearm correlates with the patient's area of swelling. Electronically Signed   By: Sandi Mariscal M.D.   On: 04/03/2021 15:25   DG Chest Portable 1 View  Result Date: 03/27/2021 CLINICAL DATA:  Shortness of breath, anemia and neutropenia EXAM: PORTABLE CHEST 1 VIEW COMPARISON:  Chest radiograph 03/10/2019 FINDINGS: There is a right chest wall port in place with tip in the mid SVC. The cardiomediastinal silhouette is within normal limits There are patchy opacities in the lateral left base. There is no other focal consolidation. There is no pulmonary edema. There is no pleural effusion or pneumothorax. There is no acute osseous abnormality. IMPRESSION: Patchy opacities in the lateral left base are favored to reflect atelectasis; however, infection cannot be excluded. Electronically Signed   By: Valetta Mole M.D.   On: 03/27/2021 15:34   DG FLUORO GUIDED LOC OF NEEDLE/CATH TIP FOR SPINAL INJECT LT  Result Date: 04/11/2021 CLINICAL DATA:  Patient with diffuse large B-cell lymphoma request received for therapeutic lumbar puncture with intrathecal methotrexate injection. EXAM: THERAPEUTIC LUMBAR PUNCTURE UNDER FLUOROSCOPIC GUIDANCE COMPARISON:  03/21/2021 FLUOROSCOPY TIME:  Fluoroscopy Time:  0.2 minute Radiation Exposure Index (if provided by the fluoroscopic device): 9.5 mGy Number of Acquired Spot Images: 2 PROCEDURE: Informed consent was obtained from the patient prior to the procedure, including potential complications of CSF leak requiring additional procedure, paresthesias, bleeding, infection, side effects from medication, seizure, headache, allergy, and pain. A pre-procedure time-out was performed. With the patient prone, the lower back was prepped with Betadine. 1% Lidocaine was used for local anesthesia. Lumbar puncture was performed at the L4-L5 level using a  20 gauge needle with return of colorless CSF with an opening pressure of 16 cm water. Three ml of CSF was removed followed by successful injection of 12 mg of methotrexate injected into the subarachnoid space. The patient tolerated the procedure well and there were no apparent complications. A sterile  dressing was applied. IMPRESSION: Technically successful fluoroscopic guided lumbar puncture intrathecal injection of methotrexate without complication. Read By: Tsosie Billing PA-C Electronically Signed   By: Jerilynn Mages.  Shick M.D.   On: 04/11/2021 10:14   DG FLUORO GUIDED LOC OF NEEDLE/CATH TIP FOR SPINAL INJECT LT  Result Date: 03/21/2021 CLINICAL DATA:  Patient with diffuse large B-cell lymphoma request received for diagnostic and therapeutic lumbar puncture. EXAM: FLUOROSCOPICALLY GUIDED LUMBAR PUNCTURE FOR INTRATHECAL CHEMOTHERAPY AND DIAGNOSTIC LUMBAR PUNCTURE TECHNIQUE: Informed consent was obtained from the patient prior to the procedure, including potential complications of CSF leak requiring additional procedure, paresthesias, bleeding, infection, side effects from medication, seizures, headache, allergy, and pain. A 'time out' was performed. With the patient prone, the lower back was prepped with Betadine. 1% Lidocaine was used for local anesthesia. Lumbar puncture was performed at the L4-L5 using a 20 gauge needle with return of clear CSF. A total of 9 mL of CSF fluid was obtained and sent to the laboratory for analysis. Opening pressure 15 cm of H2O. 12 mg of methotrexate was injected into the subarachnoid space. The patient tolerated the procedure well without apparent complication. A sterile dressing was applied. FLUOROSCOPY TIME:  0.2 minute, 2 images IMPRESSION: Technically successful fluoroscopic guided lumbar puncture. Intrathecal injection of chemotherapy without complication. Read By: Tsosie Billing PA-C Electronically Signed   By: Kathreen Devoid M.D.   On: 03/21/2021 13:03    Assessment and plan-  Patient is a 60 y.o. male diagnosed with diffuse large B-cell lymphoma who presents to symptom management clinic for symptomatic anemia.  1.  Symptomatic anemia-chemotherapy-induced anemia-hemoglobin 5.7. Worse.  Proceed with 2 units of PRBCs today.  Plan for irradiated blood products.  2.  Neutropenia-chemotherapy-induced.  Status post G-CSF.  Total white count is 0.8, ANC 500, absolute lymphocyte count 200.  We will re-start empiric Cipro.   3.  Diffuse large B-cell lymphoma NOS- s/p cycle 1 of R-CHOP chemotherapy with day 2 intrathecal methotrexate with G-CSF support on day 3.  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  He continues allopurinol  4.  Chemotherapy-induced nausea and vomiting-chemotherapy plus presence of gastric mass. Continue PPI. Continue zofran and compazine. Hold phenergan suppositories d/t neutropenia. Will give phenergan IV in clinic today. Can consider oral phenergan for refractory nausea.   Follow up with Dr. Tasia Catchings for labs and possible transfusion tomorrow. RTC as needed.    Visit Diagnosis 1. Symptomatic anemia     Patient expressed understanding and was in agreement with this plan. He also understands that He can call clinic at any time with any questions, concerns, or complaints.   Thank you for allowing me to participate in the care of this very pleasant patient.   Beckey Rutter, DNP, AGNP-C [facility] [place]

## 2021-04-17 NOTE — ED Triage Notes (Signed)
Pt to ED from home c/o fever today at home of 101.5.  States hx of cancer and chemo treatment last week and had blood transfusion today of 2 units.  States when he got home got chills and the fever.  Also c/o mid abd pain that is sharp and nausea without vomiting.  Pt A&Ox4, chest rise even and unlabored, skin pale and intact, in NAD at this time.

## 2021-04-17 NOTE — Progress Notes (Signed)
Pt reports lightheadedness, nausea, and shortness of breath. Scheduled for possible blood transfusion. Labs pending.

## 2021-04-17 NOTE — Telephone Encounter (Signed)
Pt scheduled to see NP with labs and blood today (11/1). Will keep lab/MD/ poss blood on 11/2.

## 2021-04-18 ENCOUNTER — Inpatient Hospital Stay: Payer: BC Managed Care – PPO

## 2021-04-18 ENCOUNTER — Inpatient Hospital Stay (HOSPITAL_BASED_OUTPATIENT_CLINIC_OR_DEPARTMENT_OTHER): Payer: BC Managed Care – PPO | Admitting: Oncology

## 2021-04-18 ENCOUNTER — Other Ambulatory Visit: Payer: Self-pay

## 2021-04-18 ENCOUNTER — Encounter: Payer: Self-pay | Admitting: Oncology

## 2021-04-18 ENCOUNTER — Other Ambulatory Visit: Payer: Self-pay | Admitting: Oncology

## 2021-04-18 VITALS — BP 122/84 | HR 81 | Temp 98.2°F | Resp 18 | Wt 269.9 lb

## 2021-04-18 DIAGNOSIS — D6481 Anemia due to antineoplastic chemotherapy: Secondary | ICD-10-CM

## 2021-04-18 DIAGNOSIS — R110 Nausea: Secondary | ICD-10-CM | POA: Diagnosis not present

## 2021-04-18 DIAGNOSIS — I10 Essential (primary) hypertension: Secondary | ICD-10-CM | POA: Diagnosis not present

## 2021-04-18 DIAGNOSIS — C8338 Diffuse large B-cell lymphoma, lymph nodes of multiple sites: Secondary | ICD-10-CM

## 2021-04-18 DIAGNOSIS — E119 Type 2 diabetes mellitus without complications: Secondary | ICD-10-CM | POA: Diagnosis not present

## 2021-04-18 DIAGNOSIS — R509 Fever, unspecified: Secondary | ICD-10-CM | POA: Diagnosis not present

## 2021-04-18 DIAGNOSIS — D701 Agranulocytosis secondary to cancer chemotherapy: Secondary | ICD-10-CM | POA: Diagnosis not present

## 2021-04-18 DIAGNOSIS — T451X5A Adverse effect of antineoplastic and immunosuppressive drugs, initial encounter: Secondary | ICD-10-CM

## 2021-04-18 DIAGNOSIS — D649 Anemia, unspecified: Secondary | ICD-10-CM

## 2021-04-18 DIAGNOSIS — Z87891 Personal history of nicotine dependence: Secondary | ICD-10-CM | POA: Diagnosis not present

## 2021-04-18 DIAGNOSIS — Z801 Family history of malignant neoplasm of trachea, bronchus and lung: Secondary | ICD-10-CM | POA: Diagnosis not present

## 2021-04-18 DIAGNOSIS — G893 Neoplasm related pain (acute) (chronic): Secondary | ICD-10-CM | POA: Diagnosis not present

## 2021-04-18 DIAGNOSIS — E46 Unspecified protein-calorie malnutrition: Secondary | ICD-10-CM | POA: Diagnosis not present

## 2021-04-18 DIAGNOSIS — D702 Other drug-induced agranulocytosis: Secondary | ICD-10-CM | POA: Diagnosis not present

## 2021-04-18 LAB — CBC WITH DIFFERENTIAL/PLATELET
Abs Immature Granulocytes: 0 10*3/uL (ref 0.00–0.07)
Basophils Absolute: 0 10*3/uL (ref 0.0–0.1)
Basophils Relative: 1 %
Eosinophils Absolute: 0 10*3/uL (ref 0.0–0.5)
Eosinophils Relative: 1 %
HCT: 21.7 % — ABNORMAL LOW (ref 39.0–52.0)
Hemoglobin: 7.1 g/dL — ABNORMAL LOW (ref 13.0–17.0)
Immature Granulocytes: 0 %
Lymphocytes Relative: 37 %
Lymphs Abs: 0.3 10*3/uL — ABNORMAL LOW (ref 0.7–4.0)
MCH: 27.2 pg (ref 26.0–34.0)
MCHC: 32.7 g/dL (ref 30.0–36.0)
MCV: 83.1 fL (ref 80.0–100.0)
Monocytes Absolute: 0.3 10*3/uL (ref 0.1–1.0)
Monocytes Relative: 34 %
Neutro Abs: 0.2 10*3/uL — CL (ref 1.7–7.7)
Neutrophils Relative %: 27 %
Platelets: 206 10*3/uL (ref 150–400)
RBC: 2.61 MIL/uL — ABNORMAL LOW (ref 4.22–5.81)
RDW: 19.8 % — ABNORMAL HIGH (ref 11.5–15.5)
Smear Review: NORMAL
WBC: 0.8 10*3/uL — CL (ref 4.0–10.5)
nRBC: 7.3 % — ABNORMAL HIGH (ref 0.0–0.2)

## 2021-04-18 LAB — PREPARE RBC (CROSSMATCH)

## 2021-04-18 LAB — PATHOLOGIST SMEAR REVIEW

## 2021-04-18 LAB — URINALYSIS, COMPLETE (UACMP) WITH MICROSCOPIC
Bacteria, UA: NONE SEEN
Bilirubin Urine: NEGATIVE
Glucose, UA: NEGATIVE mg/dL
Hgb urine dipstick: NEGATIVE
Ketones, ur: NEGATIVE mg/dL
Leukocytes,Ua: NEGATIVE
Nitrite: NEGATIVE
Protein, ur: NEGATIVE mg/dL
Specific Gravity, Urine: 1.02 (ref 1.005–1.030)
Squamous Epithelial / HPF: NONE SEEN (ref 0–5)
pH: 5 (ref 5.0–8.0)

## 2021-04-18 MED ORDER — ACETAMINOPHEN 325 MG PO TABS
650.0000 mg | ORAL_TABLET | Freq: Once | ORAL | Status: AC
  Administered 2021-04-18: 650 mg via ORAL
  Filled 2021-04-18: qty 2

## 2021-04-18 MED ORDER — DIPHENHYDRAMINE HCL 25 MG PO CAPS
25.0000 mg | ORAL_CAPSULE | Freq: Once | ORAL | Status: AC
  Administered 2021-04-18: 25 mg via ORAL
  Filled 2021-04-18: qty 1

## 2021-04-18 MED ORDER — CHLORHEXIDINE GLUCONATE 0.12 % MT SOLN: 15.0000 mL | Freq: Two times a day (BID) | OROMUCOSAL | 3 refills | Status: AC

## 2021-04-18 MED ORDER — SODIUM CHLORIDE 0.9 % IV SOLN
INTRAVENOUS | Status: DC
  Filled 2021-04-18: qty 250

## 2021-04-18 MED ORDER — HEPARIN SOD (PORK) LOCK FLUSH 100 UNIT/ML IV SOLN
500.0000 [IU] | Freq: Every day | INTRAVENOUS | Status: AC | PRN
  Administered 2021-04-18: 500 [IU]
  Filled 2021-04-18: qty 5

## 2021-04-18 NOTE — Progress Notes (Signed)
  Hematology/Oncology Follow Up Note [name]  [phone] [phone]  Patient Care Team: Zachary Perkins, Zachary A, MD as PCP - General (Family Medicine) , , MD as Consulting Physician (Hematology and Oncology)   Name of the patient: Zachary Perkins  [id number]  11/20/1960   REASON FOR VISIT Follow-up for DLBCL  PERTINENT ONCOLOGY HISTORY -02/21/2021 EGD showed large fungating ulcerated noncircumferential mass with no bleeding and no stigmata of recent bleeding was found on the greater curvature of the stomach.  Biopsy was taken.  Large amount of necrotic and ulcerated appearing tissue.  Acute duodenitis.  Gastritis Awaiting for pathology report.  PET scan has been scheduled for outpatient. 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 Patient has also received PRBC transfusions as well as IV Venofer treatments daily x3 during hospitalization.  Today he reports feeling tired and fatigued.  Appetite is fair. Constipation, last bowel movement was yesterday.  Small amount.  He takes Senokot 1 tablet twice daily. Left flank pain, intermittent.  He takes pain medication.  # 02/26/2021 Bone marrow biopsy showed no bone marrow involvement. 03/09/2021, PET scan showed large ulcerated necrotic mass in direct communication with the gastric lumen, extending to splenic hilum, splenic parenchyma, also involving/and abutting the distal pancreas.  Likely extending into the splenorenal ligament and abutting the under surface of left hemi diagram.  Left adrenal nodularity and increased metabolic activity suspicious for adrenal metastasis. No increased metabolic activity about periportal and celiac lymph nodes.  03/20/2021, status post cycle 1 R-CHOP intrathecal MTX with GCSF support.  required multiple units PRBC transfusion for chemotherapy-induced symptomatic  anemia. 04/10/2021, cycle 2 R-CHOP intrathecal MTX with GCSF support.   INTERVAL HISTORY Zachary Perkins is Perkins 59 y.o. male who has above history reviewed by me today presents for follow up for diffuse large B-cell lymphoma. Patient was seen by nurse practitioner yesterday for fatigue and shortness of breath. CBC showed hemoglobin of 5.7, status post 2 PRBC transfusion yesterday.  Patient has been started on prophylactic Cipro 500 mg twice daily yesterday due to neutropenia He spiked fever 101.5 last night, went to emergency room.  He has some blood work done.  He left ER without being seen after 4 hours. Today, he is afebrile.  Denies any fever or chills.  No nausea vomiting diarrhea. He uses pain medication intermittently, not frequent. Some urinary urgency, no dysuria. Appetite is fair.  Lost 1 pound.   Review of Systems  Constitutional:  Positive for appetite change and fatigue. Negative for chills, fever and unexpected weight change.  HENT:   Negative for hearing loss and voice change.   Eyes:  Negative for eye problems and icterus.  Respiratory:  Negative for chest tightness, cough and shortness of breath.   Cardiovascular:  Negative for chest pain and leg swelling.  Gastrointestinal:  Negative for abdominal distention, abdominal pain, constipation, nausea and vomiting.  Endocrine: Negative for hot flashes.  Genitourinary:  Positive for frequency. Negative for difficulty urinating and dysuria.   Musculoskeletal:  Negative for arthralgias.       Left forearm swelling and pain  Skin:  Negative for itching and rash.  Neurological:  Positive for numbness. Negative for light-headedness.  Hematological:  Negative for adenopathy. Does not bruise/bleed easily.  Psychiatric/Behavioral:  Negative for confusion.      Allergies  Allergen Reactions   Lisinopril Cough   Losartan Diarrhea   Metformin And Related       PASSED OUT     Past Medical History:  Diagnosis Date   Diabetes  mellitus without complication (HCC)    Diffuse large B cell lymphoma (HCC)    Hypertension    Hypothyroidism    Morbid obesity (HCC)      Past Surgical History:  Procedure Laterality Date   CARPAL TUNNEL RELEASE Left    ESOPHAGOGASTRODUODENOSCOPY (EGD) WITH PROPOFOL N/Perkins 02/21/2021   Procedure: ESOPHAGOGASTRODUODENOSCOPY (EGD) WITH PROPOFOL;  Surgeon: Russo, Steven Michael, DO;  Location: [facility] ENDOSCOPY;  Service: Gastroenterology;  Laterality: N/Perkins;  after 2pm   INSERTION OF MESH N/Perkins 05/02/2017   Procedure: INSERTION OF MESH;  Surgeon: Smith, Jarvis Wilton, MD;  Location: [facility] ORS;  Service: General;  Laterality: N/Perkins;   lasix eye surgery     PORTACATH PLACEMENT Right 03/09/2021   Procedure: INSERTION PORT-Perkins-CATH;  Surgeon: Pabon, Zachary F, MD;  Location: [facility] ORS;  Service: General;  Laterality: Right;   UMBILICAL HERNIA REPAIR N/Perkins 05/02/2017   Procedure: HERNIA REPAIR UMBILICAL ADULT;  Surgeon: Smith, Jarvis Wilton, MD;  Location: [facility] ORS;  Service: General;  Laterality: N/Perkins;    Social History   Socioeconomic History   Marital status: Married    Spouse name: Zachary Perkins   Number of children: 0   Years of education: 12   Highest education level: 12th grade  Occupational History   Not on file  Tobacco Use   Smoking status: Former    Packs/day: 1.00    Years: 20.00    Pack years: 20.00    Types: Cigarettes    Quit date: 04/25/2007    Years since quitting: 13.9   Smokeless tobacco: Never  Vaping Use   Vaping Use: Never used  Substance and Sexual Activity   Alcohol use: Not Currently    Comment: rare   Drug use: No   Sexual activity: Yes  Other Topics Concern   Not on file  Social History Narrative   Not on file   Social Determinants of Health   Financial Resource Strain: Not on file  Food Insecurity: Not on file  Transportation Needs: Not on file  Physical Activity: Not on file  Stress: Not on file  Social Connections: Not on file  Intimate Partner Violence: Not on  file    Family History  Problem Relation Age of Onset   COPD Father    Emphysema Father    Cancer - Lung Brother      Current Outpatient Medications:    allopurinol (ZYLOPRIM) 100 MG tablet, Take 1 tablet (100 mg total) by mouth daily., Disp: 90 tablet, Rfl: 0   bisacodyl (DULCOLAX) 10 MG suppository, Place 1 suppository (10 mg total) rectally daily as needed for severe constipation., Disp: 12 suppository, Rfl: 0   chlorhexidine (PERIDEX) 0.12 % solution, Use as directed 15 mLs in the mouth or throat 2 (two) times daily., Disp: 473 mL, Rfl: 3   ciprofloxacin (CIPRO) 500 MG tablet, Take 1 tablet (500 mg total) by mouth 2 (two) times daily., Disp: 20 tablet, Rfl: 0   docusate sodium (COLACE) 100 MG capsule, Take 200 mg by mouth daily., Disp: , Rfl:    gabapentin (NEURONTIN) 100 MG capsule, Take 1 capsule (100 mg total) by mouth 3 (three) times daily. (Patient taking differently: Take 100 mg by mouth 2 (two) times daily.), Disp: 90 capsule, Rfl: 2   levothyroxine (SYNTHROID) 300 MCG tablet, Take 300 mcg by mouth daily., Disp: , Rfl:    Lidocaine, Anorectal, (HEMORRHOIDAL RELIEF) 5 % CREA, Apply   1 g topically every 8 (eight) hours as needed., Disp: 45 g, Rfl: 0   lidocaine-prilocaine (EMLA) cream, Apply to affected area once, Disp: 30 g, Rfl: 3   loratadine (CLARITIN) 10 MG tablet, Take 10 mg by mouth daily. With GCSF injections, Disp: , Rfl:    Multiple Vitamin (MULTI-VITAMIN) tablet, Take 1 tablet by mouth daily., Disp: , Rfl:    ondansetron (ZOFRAN) 8 MG tablet, Take 1 tablet (8 mg total) by mouth every 8 (eight) hours as needed for nausea or vomiting., Disp: 90 tablet, Rfl: 1   oxyCODONE (OXY IR/ROXICODONE) 5 MG immediate release tablet, Take 1 tablet (5 mg total) by mouth every 4 (four) hours as needed for moderate pain., Disp: 60 tablet, Rfl: 0   pantoprazole (PROTONIX) 40 MG tablet, Take 1 tablet (40 mg total) by mouth 2 (two) times daily before Perkins meal., Disp: 180 tablet, Rfl: 0    polyethylene glycol (MIRALAX / GLYCOLAX) 17 g packet, Take 17 g dissolved in liquid by mouth 1 to 3 times Perkins day as needed to prevent constipation., Disp: , Rfl:    potassium chloride (KLOR-CON) 10 MEQ tablet, Take 10 mEq by mouth daily., Disp: , Rfl:    predniSONE (DELTASONE) 20 MG tablet, Take 5 tablets (100 mg total) by mouth daily. Take with food on days 2-5 of chemotherapy., Disp: 25 tablet, Rfl: 4   prochlorperazine (COMPAZINE) 10 MG tablet, Take 1 tablet (10 mg total) by mouth every 6 (six) hours as needed for nausea or vomiting., Disp: 90 tablet, Rfl: 1   promethazine (PHENERGAN) 25 MG suppository, Place 1 suppository (25 mg total) rectally every 6 (six) hours as needed for nausea or vomiting., Disp: 24 each, Rfl: 0   hydrochlorothiazide (HYDRODIURIL) 25 MG tablet, Take 25 mg by mouth daily. (Patient not taking: No sig reported), Disp: , Rfl:   Physical exam:  Vitals:   04/18/21 0835  BP: 122/84  Pulse: 81  Resp: 18  Temp: 98.2 Perkins (36.8 C)  SpO2: 100%  Weight: 269 lb 14.4 oz (122.4 kg)    Physical Exam Constitutional:      General: He is not in acute distress. HENT:     Head: Normocephalic and atraumatic.  Eyes:     General: No scleral icterus. Cardiovascular:     Rate and Rhythm: Normal rate and regular rhythm.     Heart sounds: Normal heart sounds.  Pulmonary:     Effort: Pulmonary effort is normal. No respiratory distress.     Breath sounds: No wheezing.  Abdominal:     General: Bowel sounds are normal. There is no distension.     Palpations: Abdomen is soft.  Musculoskeletal:        General: No deformity. Normal range of motion.     Cervical back: Normal range of motion and neck supple.  Skin:    General: Skin is warm and dry.     Coloration: Skin is pale.     Findings: No erythema or rash.  Neurological:     Mental Status: He is alert and oriented to person, place, and time. Mental status is at baseline.     Cranial Nerves: No cranial nerve deficit.      Coordination: Coordination normal.  Psychiatric:        Mood and Affect: Mood normal.    CMP Latest Ref Rng & Units 04/17/2021  Glucose 70 - 99 mg/dL 130(H)  BUN 6 - 20 mg/dL 19  Creatinine 0.61 - 1.24 mg/dL 0.75    Sodium 135 - 145 mmol/L 134(L)  Potassium 3.5 - 5.1 mmol/L 3.8  Chloride 98 - 111 mmol/L 101  CO2 22 - 32 mmol/L 25  Calcium 8.9 - 10.3 mg/dL 8.3(L)  Total Protein 6.5 - 8.1 g/dL 6.3(L)  Total Bilirubin 0.3 - 1.2 mg/dL 1.0  Alkaline Phos 38 - 126 U/L 88  AST 15 - 41 U/L 13(L)  ALT 0 - 44 U/L 15   CBC Latest Ref Rng & Units 04/18/2021  WBC 4.0 - 10.5 K/uL 0.8(LL)  Hemoglobin 13.0 - 17.0 g/dL 7.1(L)  Hematocrit 39.0 - 52.0 % 21.7(L)  Platelets 150 - 400 K/uL 206    RADIOGRAPHIC STUDIES: I have personally reviewed the radiological images as listed and agreed with the findings in the report. US Venous Img Upper Uni Left  Result Date: 04/03/2021 CLINICAL DATA:  Left upper extremity pain and edema for the past 2 days. Evaluate for DVT. EXAM: LEFT UPPER EXTREMITY VENOUS DOPPLER ULTRASOUND TECHNIQUE: Gray-scale sonography with graded compression, as well as color Doppler and duplex ultrasound were performed to evaluate the upper extremity deep venous system from the level of the subclavian vein and including the jugular, axillary, basilic, radial, ulnar and upper cephalic vein. Spectral Doppler was utilized to evaluate flow at rest and with distal augmentation maneuvers. COMPARISON:  None. FINDINGS: Contralateral Subclavian Vein: Respiratory phasicity is normal and symmetric with the symptomatic side. No evidence of thrombus. Normal compressibility. Internal Jugular Vein: No evidence of thrombus. Normal compressibility, respiratory phasicity and response to augmentation. Subclavian Vein: No evidence of thrombus. Normal compressibility, respiratory phasicity and response to augmentation. Axillary Vein: No evidence of thrombus. Normal compressibility, respiratory phasicity and response  to augmentation. Cephalic Vein: While the cephalic vein appears patent centrally (image 19), there is hypoechoic occlusive thrombus within the cephalic vein at the level of the distal humerus (image 17; 26 through 29)) extending to the level of the proximal forearm (image 18). Basilic Vein: No evidence of thrombus. Normal compressibility, respiratory phasicity and response to augmentation. Brachial Veins: No evidence of thrombus. Normal compressibility, respiratory phasicity and response to augmentation. Radial Veins: No evidence of thrombus. Normal compressibility, respiratory phasicity and response to augmentation. Ulnar Veins: No evidence of thrombus. Normal compressibility, respiratory phasicity and response to augmentation. Other Findings:  None visualized. IMPRESSION: 1. No evidence of DVT within the left upper extremity. 2. Examination is positive for occlusive superficial thrombophlebitis involving the cephalic vein extending from the level of the distal humerus to the proximal forearm. Note, the occluded segment of the cephalic vein at the level of the forearm correlates with the patient's area of swelling. Electronically Signed   By: Sandi Mariscal M.D.   On: 04/03/2021 15:25   DG Chest Portable 1 View  Result Date: 03/27/2021 CLINICAL DATA:  Shortness of breath, anemia and neutropenia EXAM: PORTABLE CHEST 1 VIEW COMPARISON:  Chest radiograph 03/10/2019 FINDINGS: There is Perkins right chest wall port in place with tip in the mid SVC. The cardiomediastinal silhouette is within normal limits There are patchy opacities in the lateral left base. There is no other focal consolidation. There is no pulmonary edema. There is no pleural effusion or pneumothorax. There is no acute osseous abnormality. IMPRESSION: Patchy opacities in the lateral left base are favored to reflect atelectasis; however, infection cannot be excluded. Electronically Signed   By: Valetta Mole M.D.   On: 03/27/2021 15:34   DG FLUORO GUIDED  LOC OF NEEDLE/CATH TIP FOR SPINAL INJECT LT  Result Date: 04/11/2021 CLINICAL DATA:  Patient with diffuse large B-cell lymphoma request received for therapeutic lumbar puncture with intrathecal methotrexate injection. EXAM: THERAPEUTIC LUMBAR PUNCTURE UNDER FLUOROSCOPIC GUIDANCE COMPARISON:  03/21/2021 FLUOROSCOPY TIME:  Fluoroscopy Time:  0.2 minute Radiation Exposure Index (if provided by the fluoroscopic device): 9.5 mGy Number of Acquired Spot Images: 2 PROCEDURE: Informed consent was obtained from the patient prior to the procedure, including potential complications of CSF leak requiring additional procedure, paresthesias, bleeding, infection, side effects from medication, seizure, headache, allergy, and pain. Perkins pre-procedure time-out was performed. With the patient prone, the lower back was prepped with Betadine. 1% Lidocaine was used for local anesthesia. Lumbar puncture was performed at the L4-L5 level using Perkins 20 gauge needle with return of colorless CSF with an opening pressure of 16 cm water. Three ml of CSF was removed followed by successful injection of 12 mg of methotrexate injected into the subarachnoid space. The patient tolerated the procedure well and there were no apparent complications. Perkins sterile dressing was applied. IMPRESSION: Technically successful fluoroscopic guided lumbar puncture intrathecal injection of methotrexate without complication. Read By: Tsosie Billing PA-C Electronically Signed   By: Jerilynn Mages.  Shick M.D.   On: 04/11/2021 10:14   DG FLUORO GUIDED LOC OF NEEDLE/CATH TIP FOR SPINAL INJECT LT  Result Date: 03/21/2021 CLINICAL DATA:  Patient with diffuse large B-cell lymphoma request received for diagnostic and therapeutic lumbar puncture. EXAM: FLUOROSCOPICALLY GUIDED LUMBAR PUNCTURE FOR INTRATHECAL CHEMOTHERAPY AND DIAGNOSTIC LUMBAR PUNCTURE TECHNIQUE: Informed consent was obtained from the patient prior to the procedure, including potential complications of CSF leak requiring  additional procedure, paresthesias, bleeding, infection, side effects from medication, seizures, headache, allergy, and pain. Perkins 'time out' was performed. With the patient prone, the lower back was prepped with Betadine. 1% Lidocaine was used for local anesthesia. Lumbar puncture was performed at the L4-L5 using Perkins 20 gauge needle with return of clear CSF. Perkins total of 9 mL of CSF fluid was obtained and sent to the laboratory for analysis. Opening pressure 15 cm of H2O. 12 mg of methotrexate was injected into the subarachnoid space. The patient tolerated the procedure well without apparent complication. Perkins sterile dressing was applied. FLUOROSCOPY TIME:  0.2 minute, 2 images IMPRESSION: Technically successful fluoroscopic guided lumbar puncture. Intrathecal injection of chemotherapy without complication. Read By: Tsosie Billing PA-C Electronically Signed   By: Kathreen Devoid M.D.   On: 03/21/2021 13:03     Assessment and plan  1. 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[place])   2. Fever, unspecified   3. Symptomatic anemia   4. Chemotherapy induced neutropenia (HCC)   5. Chemotherapy-induced nausea   6. Antineoplastic chemotherapy induced anemia   7. Neoplasm related pain   8. Hypercalcemia   Cancer Staging DLBCL (diffuse large B cell lymphoma) ([place]) Staging form: Hodgkin and Non-Hodgkin Lymphoma, AJCC 8th Edition - Clinical stage from 03/13/2021: Stage IV (Diffuse large B-cell lymphoma) - Signed by Earlie Server, MD on 03/15/2021  #Gastric mass status biopsy, splenic/adrenal involvement. MYC, BCL2, BCL6 FISH is negative.  Diffuse large B-cell lymphoma NOS Status post 2 cycles of R- CHOP with Day 2 IT- MTX with G-CSF. Patient has had difficulties due to cytopenia.  Labs are reviewed and discussed with patient. #Symptomatic anemia, hemoglobin has improved to 7.1.  Proceed with 1 unit of irradiated PRBC transfusion. Repeat CBC on 04/21/2021 +/- PRBC  transfusion.  #Chemotherapy-induced neutropenia.  Patient has received G-CSF support.  ANC has recovered.  Continue prophylactic Cipro 500 mg twice daily. Patient  spiked fever last night, He declines to go back to emergency room to be admitted for neutropenia fever treatments.  Currently he is afebrile.  Continue antibiotics.  Check blood culture, UA, urine culture.  # Nausea and vomiting, continue Zofran and Compazine PRN.  Hold Phenergan suppository when he is neutropenic.Marland Kitchen  Overall his symptom has improved.  # Abdominal pain, did not tolerate fentanyl patch.  Continue Oxycodone 67m Q4 hours as needed.  Pain has improved.   # baseline echo showed normal LVEF. Grade 1 diastolic dysfunction. Will refer him to cardiology to establish care.  # Hypercalcemia, probably due to AKI, dehydration.  S/p Zometa x 1.   #Malnutrition, albumin 3.1.  Follow-up with nutritionist.  He feels the nutrition supplements is too sweet and he is making his own protein shakes  #CDC recommendation of influenza vaccination and COVID-19 vaccination were discussed with patient.  He declined.  Follow-up  Keep currently scheduled appointment. 04/21/2021, lab +/- PRBC transfusion.  ZEarlie Server MD, PhD  04/18/2021

## 2021-04-18 NOTE — Progress Notes (Signed)
Pt here for follow up. Pt reports he had a fever of 101.5 last night.

## 2021-04-18 NOTE — Patient Instructions (Signed)
[facility]  Discharge Instructions: Thank you for choosing [facility] to provide your oncology and hematology care.  If you have a lab appointment with the [facility], please go directly to the [facility] and check in at the registration area.  Wear comfortable clothing and clothing appropriate for easy access to any Portacath or PICC line.   We strive to give you quality time with your provider. You may need to reschedule your appointment if you arrive late (15 or more minutes).  Arriving late affects you and other patients whose appointments are after yours.  Also, if you miss three or more appointments without notifying the office, you may be dismissed from the clinic at the provider's discretion.      For prescription refill requests, have your pharmacy contact our office and allow 72 hours for refills to be completed.    Today you received the following chemotherapy and/or immunotherapy agents BLOOD       To help prevent nausea and vomiting after your treatment, we encourage you to take your nausea medication as directed.  BELOW ARE SYMPTOMS THAT SHOULD BE REPORTED IMMEDIATELY: *FEVER GREATER THAN 100.4 F (38 C) OR HIGHER *CHILLS OR SWEATING *NAUSEA AND VOMITING THAT IS NOT CONTROLLED WITH YOUR NAUSEA MEDICATION *UNUSUAL SHORTNESS OF BREATH *UNUSUAL BRUISING OR BLEEDING *URINARY PROBLEMS (pain or burning when urinating, or frequent urination) *BOWEL PROBLEMS (unusual diarrhea, constipation, pain near the anus) TENDERNESS IN MOUTH AND THROAT WITH OR WITHOUT PRESENCE OF ULCERS (sore throat, sores in mouth, or a toothache) UNUSUAL RASH, SWELLING OR PAIN  UNUSUAL VAGINAL DISCHARGE OR ITCHING   Items with * indicate a potential emergency and should be followed up as soon as possible or go to the Emergency Department if any problems should occur.  Please show the CHEMOTHERAPY ALERT CARD or IMMUNOTHERAPY ALERT CARD at check-in to  the Emergency Department and triage nurse.  Should you have questions after your visit or need to cancel or reschedule your appointment, please contact [facility]  [phone] and follow the prompts.  Office hours are 8:00 a.m. to 4:30 p.m. Monday - Friday. Please note that voicemails left after 4:00 p.m. may not be returned until the following business day.  We are closed weekends and major holidays. You have access to a nurse at all times for urgent questions. Please call the main number to the clinic [phone] and follow the prompts.  For any non-urgent questions, you may also contact your provider using MyChart. We now offer e-Visits for anyone 65 and older to request care online for non-urgent symptoms. For details visit [email or web address].   Also download the MyChart app! Go to the app store, search "MyChart", open the app, select [facility], and log in with your MyChart username and password.  Due to Covid, a mask is required upon entering the hospital/clinic. If you do not have a mask, one will be given to you upon arrival. For doctor visits, patients may have 1 support person aged 52 or older with them. For treatment visits, patients cannot have anyone with them due to current Covid guidelines and our immunocompromised population.   Blood Transfusion, Adult, Care After This sheet gives you information about how to care for yourself after your procedure. Your doctor may also give you more specific instructions. If you have problems or questions, contact your doctor. What can I expect after the procedure? After the procedure, it is common to have: Bruising and  soreness at the IV site. A headache. Follow these instructions at home: Insertion site care   Follow instructions from your doctor about how to take care of your insertion site. This is where an IV tube was put into your vein. Make sure you: Wash your hands with soap and water before and  after you change your bandage (dressing). If you cannot use soap and water, use hand sanitizer. Change your bandage as told by your doctor. Check your insertion site every day for signs of infection. Check for: Redness, swelling, or pain. Bleeding from the site. Warmth. Pus or a bad smell. General instructions Take over-the-counter and prescription medicines only as told by your doctor. Rest as told by your doctor. Go back to your normal activities as told by your doctor. Keep all follow-up visits as told by your doctor. This is important. Contact a doctor if: You have itching or red, swollen areas of skin (hives). You feel worried or nervous (anxious). You feel weak after doing your normal activities. You have redness, swelling, warmth, or pain around the insertion site. You have blood coming from the insertion site, and the blood does not stop with pressure. You have pus or a bad smell coming from the insertion site. Get help right away if: You have signs of a serious reaction. This may be coming from an allergy or the body's defense system (immune system). Signs include: Trouble breathing or shortness of breath. Swelling of the face or feeling warm (flushed). Fever or chills. Head, chest, or back pain. Dark pee (urine) or blood in the pee. Widespread rash. Fast heartbeat. Feeling dizzy or light-headed. You may receive your blood transfusion in an outpatient setting. If so, you will be told whom to contact to report any reactions. These symptoms may be an emergency. Do not wait to see if the symptoms will go away. Get medical help right away. Call your local emergency services (911 in the [place] Do not drive yourself to the hospital. Summary Bruising and soreness at the IV site are common. Check your insertion site every day for signs of infection. Rest as told by your doctor. Go back to your normal activities as told by your doctor. Get help right away if you have signs of a  serious reaction. This information is not intended to replace advice given to you by your health care provider. Make sure you discuss any questions you have with your health care provider. Document Revised: 09/28/2020 Document Reviewed: 11/26/2018 Elsevier Patient Education  [place].

## 2021-04-18 NOTE — Progress Notes (Signed)
Nutrition Follow-up:  Patient with lymphoma (gastric mass).  Patient receiving R-CHOP.   Met with patient during infusion.  Reports appetite continues to come and go.  Drinking homemade smoothies (fruit, juices, yogurt). Eating liver and onions.  This am before treatment ate egg mcmuffin.  Reports feels full.  Using miralax for constipation.     Medications: reviewed  Labs: reviewed  Anthropometrics:   Weight 269 lb 14.4 oz on 11/2  272 lb 10/25   NUTRITION DIAGNOSIS: Inadequate oral intake continues    INTERVENTION:  Continue miralax to help with constipation Continue eating high calorie, high protein foods      MONITORING, EVALUATION, GOAL: weight trends, intake   NEXT VISIT: to be determined with treatment  Anvay Tennis B. Zenia Resides, [place], [place] Registered Dietitian [phone] (mobile)

## 2021-04-19 LAB — TYPE AND SCREEN
ABO/RH(D): O POS
Antibody Screen: NEGATIVE
Unit division: 0
Unit division: 0
Unit division: 0

## 2021-04-19 LAB — BPAM RBC
Blood Product Expiration Date: 202211082359
Blood Product Expiration Date: 202211082359
Blood Product Expiration Date: 202211122359
ISSUE DATE / TIME: 202211011035
ISSUE DATE / TIME: 202211011230
ISSUE DATE / TIME: 202211021140
Unit Type and Rh: 9500
Unit Type and Rh: 9500
Unit Type and Rh: 9500

## 2021-04-19 LAB — URINE CULTURE: Culture: NO GROWTH

## 2021-04-19 LAB — HAPTOGLOBIN: Haptoglobin: 319 mg/dL (ref 29–370)

## 2021-04-20 ENCOUNTER — Other Ambulatory Visit: Payer: BC Managed Care – PPO

## 2021-04-20 ENCOUNTER — Ambulatory Visit: Payer: BC Managed Care – PPO

## 2021-04-20 ENCOUNTER — Inpatient Hospital Stay: Payer: BC Managed Care – PPO

## 2021-04-20 ENCOUNTER — Other Ambulatory Visit: Payer: Self-pay | Admitting: Oncology

## 2021-04-20 ENCOUNTER — Other Ambulatory Visit: Payer: Self-pay

## 2021-04-20 ENCOUNTER — Ambulatory Visit: Payer: BC Managed Care – PPO | Admitting: Oncology

## 2021-04-20 DIAGNOSIS — C8338 Diffuse large B-cell lymphoma, lymph nodes of multiple sites: Secondary | ICD-10-CM

## 2021-04-20 DIAGNOSIS — D649 Anemia, unspecified: Secondary | ICD-10-CM

## 2021-04-20 LAB — CBC WITH DIFFERENTIAL/PLATELET
Abs Immature Granulocytes: 0.81 10*3/uL — ABNORMAL HIGH (ref 0.00–0.07)
Basophils Absolute: 0.1 10*3/uL (ref 0.0–0.1)
Basophils Relative: 1 %
Eosinophils Absolute: 0 10*3/uL (ref 0.0–0.5)
Eosinophils Relative: 0 %
HCT: 22.5 % — ABNORMAL LOW (ref 39.0–52.0)
Hemoglobin: 7.1 g/dL — ABNORMAL LOW (ref 13.0–17.0)
Immature Granulocytes: 11 %
Lymphocytes Relative: 7 %
Lymphs Abs: 0.6 10*3/uL — ABNORMAL LOW (ref 0.7–4.0)
MCH: 27.1 pg (ref 26.0–34.0)
MCHC: 31.6 g/dL (ref 30.0–36.0)
MCV: 85.9 fL (ref 80.0–100.0)
Monocytes Absolute: 0.7 10*3/uL (ref 0.1–1.0)
Monocytes Relative: 9 %
Neutro Abs: 5.4 10*3/uL (ref 1.7–7.7)
Neutrophils Relative %: 72 %
Platelets: 222 10*3/uL (ref 150–400)
RBC: 2.62 MIL/uL — ABNORMAL LOW (ref 4.22–5.81)
RDW: 19.9 % — ABNORMAL HIGH (ref 11.5–15.5)
Smear Review: NORMAL
WBC: 7.6 10*3/uL (ref 4.0–10.5)
nRBC: 0.8 % — ABNORMAL HIGH (ref 0.0–0.2)

## 2021-04-20 LAB — SAMPLE TO BLOOD BANK

## 2021-04-20 LAB — PREPARE RBC (CROSSMATCH)

## 2021-04-20 MED ORDER — SODIUM CHLORIDE 0.9% FLUSH
10.0000 mL | INTRAVENOUS | Status: AC | PRN
  Administered 2021-04-20: 10 mL
  Filled 2021-04-20: qty 10

## 2021-04-20 MED ORDER — HEPARIN SOD (PORK) LOCK FLUSH 100 UNIT/ML IV SOLN
500.0000 [IU] | Freq: Every day | INTRAVENOUS | Status: AC | PRN
  Filled 2021-04-20: qty 5

## 2021-04-20 MED ORDER — SODIUM CHLORIDE 0.9% IV SOLUTION
250.0000 mL | Freq: Once | INTRAVENOUS | Status: AC
  Administered 2021-04-20: 250 mL via INTRAVENOUS
  Filled 2021-04-20: qty 250

## 2021-04-20 MED ORDER — HEPARIN SOD (PORK) LOCK FLUSH 100 UNIT/ML IV SOLN
INTRAVENOUS | Status: AC
  Administered 2021-04-20: 500 [IU]
  Filled 2021-04-20: qty 5

## 2021-04-20 MED ORDER — ACETAMINOPHEN 325 MG PO TABS
650.0000 mg | ORAL_TABLET | Freq: Once | ORAL | Status: AC
  Administered 2021-04-20: 650 mg via ORAL
  Filled 2021-04-20: qty 2

## 2021-04-20 MED ORDER — DIPHENHYDRAMINE HCL 25 MG PO CAPS
25.0000 mg | ORAL_CAPSULE | Freq: Once | ORAL | Status: AC
  Administered 2021-04-20: 25 mg via ORAL
  Filled 2021-04-20: qty 1

## 2021-04-20 MED ORDER — SODIUM CHLORIDE 0.9% FLUSH
10.0000 mL | INTRAVENOUS | Status: DC | PRN
  Administered 2021-04-20: 10 mL via INTRAVENOUS
  Filled 2021-04-20: qty 10

## 2021-04-20 NOTE — Progress Notes (Signed)
0845- Hemoglobin: 7.1 today. MD, Dr. Tasia Catchings, notified and aware. Per MD order: proceed with 1 unit packed red blood cells transfusion today; MD is entering orders.

## 2021-04-20 NOTE — Patient Instructions (Signed)
[facility] ONCOLOGY   Discharge Instructions: Thank you for choosing [facility] to provide your oncology and hematology care.  If you have a lab appointment with the [facility], please go directly to the [facility] and check in at the registration area.  Wear comfortable clothing and clothing appropriate for easy access to any Portacath or PICC line.   We strive to give you quality time with your provider. You may need to reschedule your appointment if you arrive late (15 or more minutes).  Arriving late affects you and other patients whose appointments are after yours.  Also, if you miss three or more appointments without notifying the office, you may be dismissed from the clinic at the provider's discretion.      For prescription refill requests, have your pharmacy contact our office and allow 72 hours for refills to be completed.    Today you received the following: Blood Transfusion.      To help prevent nausea and vomiting after your treatment, we encourage you to take your nausea medication as directed.  BELOW ARE SYMPTOMS THAT SHOULD BE REPORTED IMMEDIATELY: *FEVER GREATER THAN 100.4 F (38 C) OR HIGHER *CHILLS OR SWEATING *NAUSEA AND VOMITING THAT IS NOT CONTROLLED WITH YOUR NAUSEA MEDICATION *UNUSUAL SHORTNESS OF BREATH *UNUSUAL BRUISING OR BLEEDING *URINARY PROBLEMS (pain or burning when urinating, or frequent urination) *BOWEL PROBLEMS (unusual diarrhea, constipation, pain near the anus) TENDERNESS IN MOUTH AND THROAT WITH OR WITHOUT PRESENCE OF ULCERS (sore throat, sores in mouth, or a toothache) UNUSUAL RASH, SWELLING OR PAIN  UNUSUAL VAGINAL DISCHARGE OR ITCHING   Items with * indicate a potential emergency and should be followed up as soon as possible or go to the Emergency Department if any problems should occur.  Please show the CHEMOTHERAPY ALERT CARD or IMMUNOTHERAPY ALERT CARD at check-in to the Emergency Department  and triage nurse.  Should you have questions after your visit or need to cancel or reschedule your appointment, please contact [facility]  [phone] and follow the prompts.  Office hours are 8:00 a.m. to 4:30 p.m. Monday - Friday. Please note that voicemails left after 4:00 p.m. may not be returned until the following business day.  We are closed weekends and major holidays. You have access to a nurse at all times for urgent questions. Please call the main number to the clinic [phone] and follow the prompts.  For any non-urgent questions, you may also contact your provider using MyChart. We now offer e-Visits for anyone 17 and older to request care online for non-urgent symptoms. For details visit [email or web address].   Also download the MyChart app! Go to the app store, search "MyChart", open the app, select [facility], and log in with your MyChart username and password.  Due to Covid, a mask is required upon entering the hospital/clinic. If you do not have a mask, one will be given to you upon arrival. For doctor visits, patients may have 1 support person aged 71 or older with them. For treatment visits, patients cannot have anyone with them due to current Covid guidelines and our immunocompromised population.

## 2021-04-21 LAB — TYPE AND SCREEN
ABO/RH(D): O POS
Antibody Screen: NEGATIVE
Unit division: 0

## 2021-04-21 LAB — BPAM RBC
Blood Product Expiration Date: 202211152359
ISSUE DATE / TIME: 202211041105
Unit Type and Rh: 9500

## 2021-04-23 ENCOUNTER — Ambulatory Visit
Admission: RE | Admit: 2021-04-23 | Discharge: 2021-04-23 | Disposition: A | Discharge status: Home / Self Care | Payer: BC Managed Care – PPO | Source: Ambulatory Visit | Attending: Oncology | Admitting: Oncology

## 2021-04-23 ENCOUNTER — Other Ambulatory Visit: Payer: Self-pay

## 2021-04-23 DIAGNOSIS — I2510 Atherosclerotic heart disease of native coronary artery without angina pectoris: Secondary | ICD-10-CM | POA: Insufficient documentation

## 2021-04-23 DIAGNOSIS — K920 Hematemesis: Secondary | ICD-10-CM | POA: Diagnosis not present

## 2021-04-23 DIAGNOSIS — R579 Shock, unspecified: Secondary | ICD-10-CM | POA: Diagnosis not present

## 2021-04-23 DIAGNOSIS — C8338 Diffuse large B-cell lymphoma, lymph nodes of multiple sites: Secondary | ICD-10-CM | POA: Insufficient documentation

## 2021-04-23 DIAGNOSIS — I700 Atherosclerosis of aorta: Secondary | ICD-10-CM | POA: Insufficient documentation

## 2021-04-23 LAB — CULTURE, BLOOD (ROUTINE X 2)
Culture: NO GROWTH
Culture: NO GROWTH
Culture: NO GROWTH
Special Requests: ADEQUATE
Special Requests: ADEQUATE
Special Requests: ADEQUATE

## 2021-04-23 LAB — GLUCOSE, CAPILLARY: Glucose-Capillary: 91 mg/dL (ref 70–99)

## 2021-04-23 MED ORDER — FLUDEOXYGLUCOSE F - 18 (FDG) INJECTION
14.5200 | Freq: Once | INTRAVENOUS | Status: AC | PRN
  Administered 2021-04-23: 14.52 via INTRAVENOUS

## 2021-04-24 ENCOUNTER — Other Ambulatory Visit: Payer: Self-pay

## 2021-04-24 ENCOUNTER — Telehealth: Payer: Self-pay | Admitting: *Deleted

## 2021-04-24 ENCOUNTER — Emergency Department: Payer: BC Managed Care – PPO

## 2021-04-24 ENCOUNTER — Inpatient Hospital Stay
Admission: EM | Admit: 2021-04-24 | Discharge: 2021-05-17 | DRG: 377 | Disposition: E | Payer: BC Managed Care – PPO | Attending: Internal Medicine | Admitting: Internal Medicine

## 2021-04-24 ENCOUNTER — Encounter: Payer: Self-pay | Admitting: Pulmonary Disease

## 2021-04-24 DIAGNOSIS — K316 Fistula of stomach and duodenum: Secondary | ICD-10-CM | POA: Diagnosis present

## 2021-04-24 DIAGNOSIS — E872 Acidosis, unspecified: Secondary | ICD-10-CM | POA: Diagnosis present

## 2021-04-24 DIAGNOSIS — C8330 Diffuse large B-cell lymphoma, unspecified site: Secondary | ICD-10-CM | POA: Diagnosis not present

## 2021-04-24 DIAGNOSIS — K3189 Other diseases of stomach and duodenum: Secondary | ICD-10-CM

## 2021-04-24 DIAGNOSIS — I10 Essential (primary) hypertension: Secondary | ICD-10-CM | POA: Diagnosis present

## 2021-04-24 DIAGNOSIS — R579 Shock, unspecified: Secondary | ICD-10-CM | POA: Diagnosis present

## 2021-04-24 DIAGNOSIS — C8339 Diffuse large B-cell lymphoma, extranodal and solid organ sites: Secondary | ICD-10-CM | POA: Diagnosis present

## 2021-04-24 DIAGNOSIS — D630 Anemia in neoplastic disease: Secondary | ICD-10-CM | POA: Diagnosis present

## 2021-04-24 DIAGNOSIS — Z888 Allergy status to other drugs, medicaments and biological substances status: Secondary | ICD-10-CM

## 2021-04-24 DIAGNOSIS — D509 Iron deficiency anemia, unspecified: Secondary | ICD-10-CM | POA: Diagnosis not present

## 2021-04-24 DIAGNOSIS — E119 Type 2 diabetes mellitus without complications: Secondary | ICD-10-CM | POA: Diagnosis present

## 2021-04-24 DIAGNOSIS — K920 Hematemesis: Principal | ICD-10-CM | POA: Diagnosis present

## 2021-04-24 DIAGNOSIS — E869 Volume depletion, unspecified: Secondary | ICD-10-CM | POA: Diagnosis present

## 2021-04-24 DIAGNOSIS — Z7952 Long term (current) use of systemic steroids: Secondary | ICD-10-CM | POA: Diagnosis not present

## 2021-04-24 DIAGNOSIS — Z801 Family history of malignant neoplasm of trachea, bronchus and lung: Secondary | ICD-10-CM | POA: Diagnosis not present

## 2021-04-24 DIAGNOSIS — Z825 Family history of asthma and other chronic lower respiratory diseases: Secondary | ICD-10-CM | POA: Diagnosis not present

## 2021-04-24 DIAGNOSIS — Z79899 Other long term (current) drug therapy: Secondary | ICD-10-CM

## 2021-04-24 DIAGNOSIS — Z20822 Contact with and (suspected) exposure to covid-19: Secondary | ICD-10-CM | POA: Diagnosis present

## 2021-04-24 DIAGNOSIS — Z79890 Hormone replacement therapy (postmenopausal): Secondary | ICD-10-CM | POA: Diagnosis not present

## 2021-04-24 DIAGNOSIS — D500 Iron deficiency anemia secondary to blood loss (chronic): Secondary | ICD-10-CM | POA: Diagnosis present

## 2021-04-24 DIAGNOSIS — R578 Other shock: Secondary | ICD-10-CM | POA: Diagnosis present

## 2021-04-24 DIAGNOSIS — K922 Gastrointestinal hemorrhage, unspecified: Secondary | ICD-10-CM | POA: Diagnosis not present

## 2021-04-24 DIAGNOSIS — E876 Hypokalemia: Secondary | ICD-10-CM | POA: Diagnosis present

## 2021-04-24 DIAGNOSIS — E039 Hypothyroidism, unspecified: Secondary | ICD-10-CM | POA: Diagnosis present

## 2021-04-24 DIAGNOSIS — A419 Sepsis, unspecified organism: Secondary | ICD-10-CM | POA: Clinically undetermined

## 2021-04-24 DIAGNOSIS — Z66 Do not resuscitate: Secondary | ICD-10-CM | POA: Diagnosis not present

## 2021-04-24 DIAGNOSIS — R6521 Severe sepsis with septic shock: Secondary | ICD-10-CM | POA: Diagnosis not present

## 2021-04-24 DIAGNOSIS — C8338 Diffuse large B-cell lymphoma, lymph nodes of multiple sites: Secondary | ICD-10-CM

## 2021-04-24 DIAGNOSIS — Z87891 Personal history of nicotine dependence: Secondary | ICD-10-CM | POA: Diagnosis not present

## 2021-04-24 DIAGNOSIS — N179 Acute kidney failure, unspecified: Secondary | ICD-10-CM | POA: Diagnosis not present

## 2021-04-24 LAB — CBC WITH DIFFERENTIAL/PLATELET
Abs Immature Granulocytes: 5.36 10*3/uL — ABNORMAL HIGH (ref 0.00–0.07)
Basophils Absolute: 0.2 10*3/uL — ABNORMAL HIGH (ref 0.0–0.1)
Basophils Relative: 0 %
Eosinophils Absolute: 0 10*3/uL (ref 0.0–0.5)
Eosinophils Relative: 0 %
HCT: 29 % — ABNORMAL LOW (ref 39.0–52.0)
Hemoglobin: 9.1 g/dL — ABNORMAL LOW (ref 13.0–17.0)
Immature Granulocytes: 13 %
Lymphocytes Relative: 3 %
Lymphs Abs: 1.1 10*3/uL (ref 0.7–4.0)
MCH: 28.3 pg (ref 26.0–34.0)
MCHC: 31.4 g/dL (ref 30.0–36.0)
MCV: 90.1 fL (ref 80.0–100.0)
Monocytes Absolute: 1.6 10*3/uL — ABNORMAL HIGH (ref 0.1–1.0)
Monocytes Relative: 4 %
Neutro Abs: 34.6 10*3/uL — ABNORMAL HIGH (ref 1.7–7.7)
Neutrophils Relative %: 80 %
Platelets: 316 10*3/uL (ref 150–400)
RBC: 3.22 MIL/uL — ABNORMAL LOW (ref 4.22–5.81)
RDW: 19.2 % — ABNORMAL HIGH (ref 11.5–15.5)
WBC: 42.8 10*3/uL — ABNORMAL HIGH (ref 4.0–10.5)
nRBC: 0.2 % (ref 0.0–0.2)

## 2021-04-24 LAB — COMPREHENSIVE METABOLIC PANEL
ALT: 19 U/L (ref 0–44)
AST: 25 U/L (ref 15–41)
Albumin: 2.5 g/dL — ABNORMAL LOW (ref 3.5–5.0)
Alkaline Phosphatase: 133 U/L — ABNORMAL HIGH (ref 38–126)
Anion gap: 19 — ABNORMAL HIGH (ref 5–15)
BUN: 9 mg/dL (ref 6–20)
CO2: 17 mmol/L — ABNORMAL LOW (ref 22–32)
Calcium: 8.3 mg/dL — ABNORMAL LOW (ref 8.9–10.3)
Chloride: 101 mmol/L (ref 98–111)
Creatinine, Ser: 1.4 mg/dL — ABNORMAL HIGH (ref 0.61–1.24)
GFR, Estimated: 58 mL/min — ABNORMAL LOW (ref >60)
Glucose, Bld: 275 mg/dL — ABNORMAL HIGH (ref 70–99)
Potassium: 3.2 mmol/L — ABNORMAL LOW (ref 3.5–5.1)
Sodium: 137 mmol/L (ref 135–145)
Total Bilirubin: 0.4 mg/dL (ref 0.3–1.2)
Total Protein: 5.9 g/dL — ABNORMAL LOW (ref 6.5–8.1)

## 2021-04-24 LAB — HEMOGLOBIN AND HEMATOCRIT, BLOOD
HCT: 23.8 % — ABNORMAL LOW (ref 39.0–52.0)
Hemoglobin: 7.7 g/dL — ABNORMAL LOW (ref 13.0–17.0)

## 2021-04-24 LAB — LACTIC ACID, PLASMA
Lactic Acid, Venous: >9 mmol/L (ref 0.5–1.9)
Lactic Acid, Venous: 5.6 mmol/L (ref 0.5–1.9)

## 2021-04-24 LAB — RESP PANEL BY RT-PCR (FLU A&B, COVID) ARPGX2
Influenza A by PCR: NEGATIVE
Influenza B by PCR: NEGATIVE
SARS Coronavirus 2 by RT PCR: NEGATIVE

## 2021-04-24 LAB — MRSA NEXT GEN BY PCR, NASAL: MRSA by PCR Next Gen: NOT DETECTED

## 2021-04-24 LAB — PROTIME-INR
INR: 1.2 (ref 0.8–1.2)
Prothrombin Time: 15 seconds (ref 11.4–15.2)

## 2021-04-24 LAB — GLUCOSE, CAPILLARY: Glucose-Capillary: 175 mg/dL — ABNORMAL HIGH (ref 70–99)

## 2021-04-24 LAB — APTT: aPTT: 27 seconds (ref 24–36)

## 2021-04-24 MED ORDER — SODIUM CHLORIDE 0.9 % IV SOLN
1.0000 g | Freq: Three times a day (TID) | INTRAVENOUS | Status: DC
  Administered 2021-04-24 – 2021-04-25 (×2): 1 g via INTRAVENOUS
  Filled 2021-04-24 (×4): qty 1

## 2021-04-24 MED ORDER — DOCUSATE SODIUM 100 MG PO CAPS: 100.0000 mg | ORAL_CAPSULE | Freq: Two times a day (BID) | ORAL | Status: DC | PRN

## 2021-04-24 MED ORDER — PANTOPRAZOLE INFUSION (NEW) - SIMPLE MED
8.0000 mg/h | INTRAVENOUS | Status: DC
  Administered 2021-04-24 – 2021-04-25 (×2): 8 mg/h via INTRAVENOUS
  Filled 2021-04-24 (×3): qty 100

## 2021-04-24 MED ORDER — POTASSIUM CHLORIDE IN NACL 20-0.9 MEQ/L-% IV SOLN
INTRAVENOUS | Status: DC
  Filled 2021-04-24 (×4): qty 1000

## 2021-04-24 MED ORDER — SODIUM CHLORIDE 0.9% IV SOLUTION
Freq: Once | INTRAVENOUS | Status: AC
  Filled 2021-04-24: qty 250

## 2021-04-24 MED ORDER — SODIUM CHLORIDE 0.9 % IV SOLN
12.5000 mg | Freq: Once | INTRAVENOUS | Status: AC
  Administered 2021-04-24: 12.5 mg via INTRAVENOUS
  Filled 2021-04-24 (×2): qty 0.5

## 2021-04-24 MED ORDER — CHLORHEXIDINE GLUCONATE CLOTH 2 % EX PADS
6.0000 | MEDICATED_PAD | Freq: Every day | CUTANEOUS | Status: DC
  Administered 2021-04-24 – 2021-04-25 (×2): 6 via TOPICAL

## 2021-04-24 MED ORDER — FLUCONAZOLE IN SODIUM CHLORIDE 400-0.9 MG/200ML-% IV SOLN
400.0000 mg | INTRAVENOUS | Status: DC
  Filled 2021-04-24: qty 200

## 2021-04-24 MED ORDER — ONDANSETRON HCL 4 MG/2ML IJ SOLN
INTRAMUSCULAR | Status: AC
  Administered 2021-04-24: 4 mg via INTRAVENOUS
  Filled 2021-04-24: qty 2

## 2021-04-24 MED ORDER — SODIUM CHLORIDE 0.9 % IV BOLUS
1000.0000 mL | Freq: Once | INTRAVENOUS | Status: AC
  Administered 2021-04-24: 1000 mL via INTRAVENOUS

## 2021-04-24 MED ORDER — FLUCONAZOLE IN SODIUM CHLORIDE 400-0.9 MG/200ML-% IV SOLN
800.0000 mg | Freq: Once | INTRAVENOUS | Status: AC
  Administered 2021-04-24: 800 mg via INTRAVENOUS
  Filled 2021-04-24: qty 400

## 2021-04-24 MED ORDER — METOCLOPRAMIDE HCL 5 MG/ML IJ SOLN
10.0000 mg | Freq: Once | INTRAMUSCULAR | Status: AC
  Administered 2021-04-24: 10 mg via INTRAVENOUS
  Filled 2021-04-24: qty 2

## 2021-04-24 MED ORDER — ONDANSETRON HCL 4 MG/2ML IJ SOLN
4.0000 mg | Freq: Four times a day (QID) | INTRAMUSCULAR | Status: DC | PRN
  Administered 2021-04-25: 4 mg via INTRAVENOUS
  Filled 2021-04-24: qty 2

## 2021-04-24 MED ORDER — PANTOPRAZOLE SODIUM 40 MG IV SOLR: 40.0000 mg | Freq: Two times a day (BID) | INTRAVENOUS | Status: DC

## 2021-04-24 MED ORDER — POLYETHYLENE GLYCOL 3350 17 G PO PACK: 17.0000 g | PACK | Freq: Every day | ORAL | Status: DC | PRN

## 2021-04-24 MED ORDER — PANTOPRAZOLE 80MG IVPB - SIMPLE MED
80.0000 mg | Freq: Once | INTRAVENOUS | Status: AC
  Administered 2021-04-24: 80 mg via INTRAVENOUS
  Filled 2021-04-24: qty 80

## 2021-04-24 MED ORDER — IOHEXOL 350 MG/ML SOLN
80.0000 mL | Freq: Once | INTRAVENOUS | Status: AC | PRN
  Administered 2021-04-24: 80 mL via INTRAVENOUS

## 2021-04-24 MED ORDER — LACTATED RINGERS IV BOLUS
500.0000 mL | Freq: Once | INTRAVENOUS | Status: AC
  Administered 2021-04-24: 500 mL via INTRAVENOUS

## 2021-04-24 MED ORDER — SODIUM CHLORIDE 0.9 % IV SOLN
25.0000 mg | Freq: Four times a day (QID) | INTRAVENOUS | Status: DC | PRN
  Administered 2021-04-25: 25 mg via INTRAVENOUS
  Filled 2021-04-24: qty 25
  Filled 2021-04-24: qty 1

## 2021-04-24 MED ORDER — ONDANSETRON HCL 4 MG/2ML IJ SOLN: 4.0000 mg | Freq: Once | INTRAMUSCULAR | Status: AC

## 2021-04-24 MED ORDER — HYDROMORPHONE HCL 1 MG/ML IJ SOLN
1.0000 mg | INTRAMUSCULAR | Status: DC | PRN
  Administered 2021-04-24 – 2021-04-25 (×4): 1 mg via INTRAVENOUS
  Filled 2021-04-24 (×4): qty 1

## 2021-04-24 MED ORDER — SODIUM CHLORIDE 0.9 % IV SOLN: INTRAVENOUS | Status: DC

## 2021-04-24 MED ORDER — TRANEXAMIC ACID-NACL 1000-0.7 MG/100ML-% IV SOLN
1000.0000 mg | INTRAVENOUS | Status: AC
  Administered 2021-04-24: 1000 mg via INTRAVENOUS
  Filled 2021-04-24: qty 100

## 2021-04-24 NOTE — Telephone Encounter (Signed)
[facility] called reporting that patient passed out and started vomiting blood so he is in ambulance on his way to ER now

## 2021-04-24 NOTE — Progress Notes (Signed)
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 Surgery has discussed things with pt and wife in regard to this. Please see detailed note.   On call GI tonight Dr. Vicente Males is also aware about the pt. As per ICU pt is now DNR and they plan on palliative care consult.

## 2021-04-24 NOTE — ED Triage Notes (Signed)
Pt to ED via [facility] from home. Pt started vomiting bright red blood this afternoon and wife states pt did lose consciousness after vomiting. Wife denies pt hitting head. Pt with hx of lymphoma and currently on cancer tx. Pt recently received four blood transfusions due to anemia and unknown source of bleeding. EMS stating fluid noted in right lower lob upon auscultation.   Pt pale and diaphoretic on arrival. BP 76/53 on arrival. Pt tachy with HR 120s. CBG 311.

## 2021-04-24 NOTE — ED Provider Notes (Signed)
[facility] Emergency Department Provider Note  ____________________________________________   Event Date/Time   First MD Initiated Contact with Patient 04/19/2021 1532     (approximate)  I have reviewed the triage vital signs and the nursing notes.   HISTORY  Chief Complaint Hematemesis (/)    HPI Zachary Perkins is a 60 y.o. 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  Patient reports vomiting started around 2 PM.  Patient denies this ever happening before, intermittent, nothing makes it better or worse.  Reports having a history of anemia and having to have blood transfusions done on Friday.  Typically gets irradiated blood.  Patient did not lose consciousness with wife due to the vomiting but denies hitting his head.  Patient is currently awake and alert although diaphoretic he denies any headaches.  Some mild upper abdominal pain.          Past Medical History:  Diagnosis Date   Diabetes mellitus without complication ([place])    Diffuse large B cell lymphoma ([place])    Hypertension    Hypothyroidism    Morbid obesity ([place])     Patient Active Problem List   Diagnosis Date Noted   Symptomatic anemia 04/17/2021   Chemotherapy-induced nausea 04/17/2021   Thrombophlebitis 04/10/2021   Chemotherapy induced neutropenia ([place]) 03/27/2021   Neoplasm related pain 03/27/2021   Encounter for antineoplastic chemotherapy 03/20/2021   Hypercalcemia 03/15/2021   Non-intractable vomiting 03/15/2021   AKI (acute kidney injury) ([place]) 03/15/2021   DLBCL (diffuse large B cell lymphoma) ([place]) 03/14/2021   Morbid obesity ([place]) 03/05/2021   Goals of care, counseling/discussion 02/28/2021   Iron deficiency anemia due to chronic blood loss    Gastric mass 02/20/2021   Weight loss 02/20/2021   Anemia 02/20/2021   Diabetes mellitus type II, controlled ([place]) 02/20/2021   Nausea in adult  02/20/2021   Constipation 02/20/2021   Essential hypertension 02/20/2021   Hypothyroidism 87/56/4332   Umbilical hernia without obstruction and without gangrene 11/12/2016   Prediabetes 11/23/2015    Past Surgical History:  Procedure Laterality Date   CARPAL TUNNEL RELEASE Left    ESOPHAGOGASTRODUODENOSCOPY (EGD) WITH PROPOFOL N/A 02/21/2021   Procedure: ESOPHAGOGASTRODUODENOSCOPY (EGD) WITH PROPOFOL;  Surgeon: Annamaria Helling, DO;  Location: [facility] ENDOSCOPY;  Service: Gastroenterology;  Laterality: N/A;  after 2pm   INSERTION OF MESH N/A 05/02/2017   Procedure: INSERTION OF MESH;  Surgeon: Leonie Green, MD;  Location: [facility] ORS;  Service: General;  Laterality: N/A;   lasix eye surgery     PORTACATH PLACEMENT Right 03/09/2021   Procedure: INSERTION PORT-A-CATH;  Surgeon: Jules Husbands, MD;  Location: [facility] ORS;  Service: General;  Laterality: Right;   UMBILICAL HERNIA REPAIR N/A 05/02/2017   Procedure: HERNIA REPAIR UMBILICAL ADULT;  Surgeon: Leonie Green, MD;  Location: [facility] ORS;  Service: General;  Laterality: N/A;    Prior to Admission medications   Medication Sig Start Date End Date Taking? Authorizing Provider  allopurinol (ZYLOPRIM) 100 MG tablet Take 1 tablet (100 mg total) by mouth daily. 04/11/21   Earlie Server, MD  bisacodyl (DULCOLAX) 10 MG suppository Place 1 suppository (10 mg total) rectally daily as needed for severe constipation. 02/26/21   Aline August, MD  chlorhexidine (PERIDEX) 0.12 % solution Use as directed 15 mLs in the mouth or throat 2 (two) times daily. 04/18/21   Earlie Server, MD  ciprofloxacin (CIPRO) 500 MG tablet  Take 1 tablet (500 mg total) by mouth 2 (two) times daily. 04/17/21   Verlon Au, NP  docusate sodium (COLACE) 100 MG capsule Take 200 mg by mouth daily.    [provider]  gabapentin (NEURONTIN) 100 MG capsule Take 1 capsule (100 mg total) by mouth 3 (three) times daily. Patient taking differently: Take 100 mg by mouth 2  (two) times daily. 04/03/21   Earlie Server, MD  hydrochlorothiazide (HYDRODIURIL) 25 MG tablet Take 25 mg by mouth daily. Patient not taking: No sig reported    [provider]  levothyroxine (SYNTHROID) 300 MCG tablet Take 300 mcg by mouth daily. 02/12/21   [provider]  Lidocaine, Anorectal, (HEMORRHOIDAL RELIEF) 5 % CREA Apply 1 g topically every 8 (eight) hours as needed. 04/02/21   Earlie Server, MD  lidocaine-prilocaine (EMLA) cream Apply to affected area once 03/15/21   Earlie Server, MD  loratadine (CLARITIN) 10 MG tablet Take 10 mg by mouth daily. With GCSF injections    [provider]  Multiple Vitamin (MULTI-VITAMIN) tablet Take 1 tablet by mouth daily.    [provider]  ondansetron (ZOFRAN) 8 MG tablet Take 1 tablet (8 mg total) by mouth every 8 (eight) hours as needed for nausea or vomiting. 02/28/21   Earlie Server, MD  oxyCODONE (OXY IR/ROXICODONE) 5 MG immediate release tablet Take 1 tablet (5 mg total) by mouth every 4 (four) hours as needed for moderate pain. 03/28/21   Jacquelin Hawking, NP  pantoprazole (PROTONIX) 40 MG tablet Take 1 tablet (40 mg total) by mouth 2 (two) times daily before a meal. 03/27/21   Earlie Server, MD  polyethylene glycol (MIRALAX / GLYCOLAX) 17 g packet Take 17 g dissolved in liquid by mouth 1 to 3 times a day as needed to prevent constipation. 04/17/21   Verlon Au, NP  potassium chloride (KLOR-CON) 10 MEQ tablet Take 10 mEq by mouth daily. 01/02/21   [provider]  predniSONE (DELTASONE) 20 MG tablet Take 5 tablets (100 mg total) by mouth daily. Take with food on days 2-5 of chemotherapy. 04/10/21   Earlie Server, MD  prochlorperazine (COMPAZINE) 10 MG tablet Take 1 tablet (10 mg total) by mouth every 6 (six) hours as needed for nausea or vomiting. 02/28/21   Earlie Server, MD  promethazine (PHENERGAN) 25 MG suppository Place 1 suppository (25 mg total) rectally every 6 (six) hours as needed for nausea or vomiting. 03/14/21   Earlie Server,  MD    Allergies Lisinopril, Losartan, and Metformin and related  Family History  Problem Relation Age of Onset   COPD Father    Emphysema Father    Cancer - Lung Brother     Social History Social History   Tobacco Use   Smoking status: Former    Packs/day: 1.00    Years: 20.00    Pack years: 20.00    Types: Cigarettes    Quit date: 04/25/2007    Years since quitting: 14.0   Smokeless tobacco: Never  Vaping Use   Vaping Use: Never used  Substance Use Topics   Alcohol use: Not Currently    Comment: rare   Drug use: No      Review of Systems Constitutional: No fever/chills Eyes: No visual changes. ENT: No sore throat. Cardiovascular: Denies chest pain. Respiratory: Denies shortness of breath. Gastrointestinal: Mild upper abdominal pain, vomiting blood Genitourinary: Negative for dysuria. Musculoskeletal: Negative for back pain. Skin: Negative for rash. Neurological: Negative for headaches, focal  weakness or numbness. All other ROS negative ____________________________________________   PHYSICAL EXAM:  VITAL SIGNS: ED Triage Vitals [04/28/2021 1533]  Enc Vitals Group     BP (!) 67/48     Pulse Rate (!) 120     Resp (!) 29     Temp      Temp src      SpO2 96 %     Weight      Height      Head Circumference      Peak Flow      Pain Score      Pain Loc      Pain Edu?      Excl. in [place]?     Constitutional: Alert and oriented.  Pale, diaphoretic Eyes: Conjunctivae are normal. EOMI. Head: Atraumatic. Nose: No congestion/rhinnorhea. Mouth/Throat: Mucous membranes are dry.  Dried blood noted around his mouth Neck: No stridor. Trachea Midline. FROM Cardiovascular: Normal rate, regular rhythm. Grossly normal heart sounds.  Good peripheral circulation.  Port on the right chest wall Respiratory: Normal respiratory effort.  No retractions. Lungs CTAB. Gastrointestinal: Patient reports a little bit of discomfort in the upper abdomen but when I push on it there  is no rebound or guarding or tenderness with palpation.. No distention. No abdominal bruits.  Musculoskeletal: No lower extremity tenderness nor edema.  No joint effusions. Neurologic:  Normal speech and language. No gross focal neurologic deficits are appreciated.  Skin: Cool, clammy no rash noted. Psychiatric: Mood and affect are normal. Speech and behavior are normal. GU: Deferred   ____________________________________________   LABS (all labs ordered are listed, but only abnormal results are displayed)  Labs Reviewed  CULTURE, BLOOD (ROUTINE X 2)  CULTURE, BLOOD (ROUTINE X 2)  CBC WITH DIFFERENTIAL/PLATELET  COMPREHENSIVE METABOLIC PANEL  PROTIME-INR  APTT  LACTIC ACID, PLASMA  LACTIC ACID, PLASMA  CBC WITH DIFFERENTIAL/PLATELET  PREPARE RBC (CROSSMATCH)  TYPE AND SCREEN   ____________________________________________   ED ECG REPORT I, Vanessa [place], the attending physician, personally viewed and interpreted this ECG.  Sinus tachycardia rate of 117, no ST elevation, no T wave inversions, normal intervals ____________________________________________  RADIOLOGY Robert Bellow, personally viewed and evaluated these images (plain radiographs) as part of my medical decision making, as well as reviewing the written report by the radiologist.  ED MD interpretation:  no free air   Official radiology report(s): DG Chest Portable 1 View  Result Date: 05/15/2021 CLINICAL DATA:  Gastrointestinal bleed. vomiting bright red blood this afternoon and wife states pt did lose consciousness after vomiting. EXAM: PORTABLE CHEST 1 VIEW COMPARISON:  Chest x-ray 03/27/2021 FINDINGS: Right chest wall Port-A-Cath with tip overlying the right atrium. Prominent cardiac silhouette which may be due to AP portable technique. The heart and mediastinal contours are within normal limits. Aortic calcification. No focal consolidation. No pulmonary edema. No pleural effusion. No pneumothorax. No acute  osseous abnormality. IMPRESSION: No active disease. Electronically Signed   By: Iven Finn M.D.   On: 05/14/2021 16:09    ____________________________________________   PROCEDURES  Procedure(s) performed (including Critical Care):  .1-3 Lead EKG Interpretation Performed by: Vanessa Elliott, MD Authorized by: Vanessa [place], MD     Interpretation: abnormal     ECG rate:  120s   ECG rate assessment: tachycardic     Rhythm: sinus tachycardia     Ectopy: none     Conduction: normal   .Critical Care Performed by: Vanessa , MD Authorized by: Jari Pigg,  Royetta Crochet, MD   Critical care provider statement:    Critical care time (minutes):  45   Critical care was necessary to treat or prevent imminent or life-threatening deterioration of the following conditions: Severe GI bleed.   Critical care was time spent personally by me on the following activities:  Blood draw for specimens, development of treatment plan with patient or surrogate, discussions with consultants, evaluation of patient's response to treatment, examination of patient, obtaining history from patient or surrogate, ordering and performing treatments and interventions, ordering and review of laboratory studies, ordering and review of radiographic studies, pulse oximetry and re-evaluation of patient's condition   Care discussed with: admitting provider     ____________________________________________   INITIAL IMPRESSION / ASSESSMENT AND PLAN / ED COURSE  Zachary Perkins was evaluated in Emergency Department on 05/13/2021 for the symptoms described in the history of present illness. He was evaluated in the context of the global COVID-19 pandemic, which necessitated consideration that the patient might be at risk for infection with the SARS-CoV-2 virus that causes COVID-19. Institutional protocols and algorithms that pertain to the evaluation of patients at risk for COVID-19 are in a state of rapid change based on information  released by regulatory bodies including the CDC and federal and state organizations. These policies and algorithms were followed during the patient's care in the ED.    Patient comes in with severe GI bleed.  Patient came in actively vomiting bright red blood.  4 mg of Zofran and 10 of Reglan were given most likely upper GI bleed secondary to known lymphoma mass in his stomach.  Patient was hypotensive, tachycardic very diaphoretic.  Patient was given 1 L of fluids but still remained hypotensive.  Discussed with blood bank will be able to get irradiated emergency release blood.  We will do 1 unit.  Patient started on Protonix for possible ulcer.  Patient denies any alcohol use, cirrhosis to suggest variceal bleeding.  TXA was also given.  Chest x-ray was ordered upright to evaluate for any free air under the diaphragm although patient's abdominal exam is pretty reassuring at this time.  I have low suspicion for perforated viscus.  Patient denies any headaches and although he did syncopized he did not hit his head there is no bumps on his head and he is alert and oriented moving all extremities well.  I did also consult the GI doctor Tahiliani who will come down to evaluate patient.  Patient is now looking better after 1 unit of blood.  Discussed the case with Dr. Delana Meyer as well-recommend CT angio.  Also add on CT head just in case he did hit his head.  Also discussed with the ICU team Dr. Ronalee Belts. Will admit pt.    Patient's lactate was elevated most likely from the vomiting blood.  Patient has been transfused a unit and got some fluids already.         ____________________________________________   FINAL CLINICAL IMPRESSION(S) / ED DIAGNOSES   Final diagnoses:  Gastrointestinal hemorrhage, unspecified gastrointestinal hemorrhage type  Shock ([place])      MEDICATIONS GIVEN DURING THIS VISIT:  Medications  pantoprazole (PROTONIX) 80 mg /NS 100 mL IVPB (has no administration in time  range)  pantoprozole (PROTONIX) 80 mg /NS 100 mL infusion (has no administration in time range)  pantoprazole (PROTONIX) injection 40 mg (has no administration in time range)  tranexamic acid (CYKLOKAPRON) IVPB 1,000 mg (has no administration in time range)  0.9 %  sodium chloride  infusion (Manually program via Guardrails IV Fluids) (has no administration in time range)  metoCLOPramide (REGLAN) injection 10 mg (10 mg Intravenous Given 05/10/2021 1535)  ondansetron (ZOFRAN) injection 4 mg (4 mg Intravenous Given 04/28/2021 1534)  sodium chloride 0.9 % bolus 1,000 mL (1,000 mLs Intravenous New Bag/Given 05/15/2021 1535)     ED Discharge Orders     None        Note:  This document was prepared using Dragon voice recognition software and may include unintentional dictation errors.    Vanessa [place], MD 05/10/2021 [phone]

## 2021-04-24 NOTE — ED Notes (Addendum)
Will hold 2nd unit pending CBC and trending BPs per MD

## 2021-04-24 NOTE — Progress Notes (Addendum)
 =   GOALS OF CARE =   Advance Care Planning/Goals of Care discussion was performed during the course of treatment to decide on type of care right for this patient following admission to the ICU with a life threatening diagnosis   I met with patient and patient's wife  Terris, Germano to discuss goals of care in details following review of patient's lab and diagnostic data, General surgery's consulting  notes and recommendations. I reviewed findings and imaging with the family at the bedside and answered all their question.    Discussed prognosis, expected outcome with or without ongoing aggressive treatments and the options for de-escalation of care.   Diagnosis(es): Acute upper GI bleed with hemorrhagic shock Underlying anemia due to lymphoma/chronic blood loss Known gastric mass Hx: Large B-cell lymphoma CT findings: Large left upper quadrant ulcerative necrotizing mass with direct communication to the fundal gastric lumen. The patient will likely get peritonitis there is evidence of perforation Prognosis: Poor Code Status Order: DNR Disposition: ICU Next Steps:  Patient wishes to pursue ongoing treatment with relatively conservative measures (e.g., IV fluids, antibiotics), but would not wish to escalate to other invasive or life sustaining measures. Patient and wife concurred that if deteriorated to pulselessness, patient would prefer a natural death as opposed to invasive measures such as CPR/ACLS drugs and intubation. Will place palliative consult Family are satisfied with Plan of action and management. All questions answered      Critical care time: 25 Minutes Total Time Spent Face to Face addressing advance care planning in the presence of the Patient and Wife    Rufina Falco, DNP, CCRN, FNP-C, AGACNP-BC Acute Care Nurse Practitioner  [facility] Pager: [phone] [place] [facility]  .

## 2021-04-24 NOTE — ED Notes (Signed)
RN at bedside to answer call bell. Pt out of bed with monitoring cords off and on toilet. Pt actively vomiting.

## 2021-04-24 NOTE — Consult Note (Addendum)
Pharmacy Antibiotic Note  Zachary Perkins is a 60 y.o. male admitted on 04/27/2021 with  Patient with gastric perforation from the lymphoma mass.  Peritonitis imminent./intra-abdominal infection .  Pharmacy has been consulted for meropenem and fluconazole dosing.  Plan: Will give fluconazole 800 mg loading dose followed by 400 mg daily.   Will start meropenem 1 g q8H.   Height: 6\' 5"  (195.6 cm) Weight: 124.9 kg (275 lb 5.7 oz) IBW/kg (Calculated) : 89.1  Temp (24hrs), Avg:98 F (36.7 C), Min:97.8 F (36.6 C), Max:98.2 F (36.8 C)  Recent Labs  Lab 04/18/21 0756 04/20/21 0759 05/11/2021 1533 05/03/2021 1535 05/01/2021 1657  WBC 0.8* 7.6  --   --  42.8*  CREATININE  --   --  1.40*  --   --   LATICACIDVEN  --   --   --  >9.0* 5.6*    Estimated Creatinine Clearance: 83.1 mL/min (A) (by C-G formula based on SCr of 1.4 mg/dL (H)).    Allergies  Allergen Reactions   Lisinopril Cough   Losartan Diarrhea   Metformin And Related     PASSED OUT    Antimicrobials this admission: 11/8 fluconazole >>  11/8 meropenem >>   Dose adjustments this admission: None  Microbiology results: 11/8 BCx: pending  Thank you for allowing pharmacy to be a part of this patient's care.  Oswald Hillock, PharmD, BCPS 04/18/2021 7:35 PM

## 2021-04-24 NOTE — ED Notes (Addendum)
RN at bedside to transport pt to CT. Pt demanding to get up to toilet for BM. RN explaining concerned that pt will vagal and pass out.

## 2021-04-24 NOTE — ED Notes (Signed)
Lab called to draw recollect CBC

## 2021-04-24 NOTE — ED Notes (Addendum)
1st unit initiated, MD at bedside. Unable to scan in [facility] at this time due to scanning malfunction. MD made aware.

## 2021-04-24 NOTE — Consult Note (Signed)
Subjective:   CC: gastric   HPI:  Zachary Perkins is a 60 y.o. male who was consulted by [name] for issue above.  Symptoms were first noted several hours ago. Hematemesis and upper abdominal pain, sudden onset.  No other associated symptoms.  No worsening or alleviating factors. Workup including CT noted below.   Surgery consulted for any surgical intervention re: know large gastric mass.   Past Medical History:  has a past medical history of Diabetes mellitus without complication ([place]), Diffuse large B cell lymphoma ([place]), Hypertension, Hypothyroidism, and Morbid obesity ([place]).  Past Surgical History:  Past Surgical History:  Procedure Laterality Date   CARPAL TUNNEL RELEASE Left    ESOPHAGOGASTRODUODENOSCOPY (EGD) WITH PROPOFOL N/A 02/21/2021   Procedure: ESOPHAGOGASTRODUODENOSCOPY (EGD) WITH PROPOFOL;  Surgeon: Annamaria Helling, DO;  Location: [facility] ENDOSCOPY;  Service: Gastroenterology;  Laterality: N/A;  after 2pm   INSERTION OF MESH N/A 05/02/2017   Procedure: INSERTION OF MESH;  Surgeon: Leonie Green, MD;  Location: [facility] ORS;  Service: General;  Laterality: N/A;   lasix eye surgery     PORTACATH PLACEMENT Right 03/09/2021   Procedure: INSERTION PORT-A-CATH;  Surgeon: Jules Husbands, MD;  Location: [facility] ORS;  Service: General;  Laterality: Right;   UMBILICAL HERNIA REPAIR N/A 05/02/2017   Procedure: HERNIA REPAIR UMBILICAL ADULT;  Surgeon: Leonie Green, MD;  Location: [facility] ORS;  Service: General;  Laterality: N/A;    Family History: family history includes COPD in his father; Cancer - Lung in his brother; Emphysema in his father.  Social History:  reports that he quit smoking about 14 years ago. His smoking use included cigarettes. He has a 20.00 pack-year smoking history. He has never used smokeless tobacco. He reports that he does not currently use alcohol. He reports that he does not use drugs.  Current Medications:  Prior to Admission medications    Medication Sig Start Date End Date Taking? Authorizing Provider  allopurinol (ZYLOPRIM) 100 MG tablet Take 1 tablet (100 mg total) by mouth daily. 04/11/21  Yes Earlie Server, MD  chlorhexidine (PERIDEX) 0.12 % solution Use as directed 15 mLs in the mouth or throat 2 (two) times daily. 04/18/21  Yes Earlie Server, MD  ciprofloxacin (CIPRO) 500 MG tablet Take 1 tablet (500 mg total) by mouth 2 (two) times daily. 04/17/21  Yes Verlon Au, NP  gabapentin (NEURONTIN) 100 MG capsule Take 1 capsule (100 mg total) by mouth 3 (three) times daily. Patient taking differently: Take 100 mg by mouth 2 (two) times daily. 04/03/21  Yes Earlie Server, MD  levothyroxine (SYNTHROID) 300 MCG tablet Take 300 mcg by mouth daily. 02/12/21  Yes [provider]  loratadine (CLARITIN) 10 MG tablet Take 10 mg by mouth daily. With GCSF injections   Yes [provider]  Multiple Vitamin (MULTI-VITAMIN) tablet Take 1 tablet by mouth daily.   Yes [provider]  pantoprazole (PROTONIX) 40 MG tablet Take 1 tablet (40 mg total) by mouth 2 (two) times daily before a meal. 03/27/21  Yes Earlie Server, MD  potassium chloride (KLOR-CON) 10 MEQ tablet Take 10 mEq by mouth daily. 01/02/21  Yes [provider]  bisacodyl (DULCOLAX) 10 MG suppository Place 1 suppository (10 mg total) rectally daily as needed for severe constipation. 02/26/21   Aline August, MD  docusate sodium (COLACE) 100 MG capsule Take 200 mg by mouth daily.    [provider]  hydrochlorothiazide (HYDRODIURIL) 25 MG tablet Take 25 mg by mouth daily.  Patient not taking: No sig reported    [provider]  Lidocaine, Anorectal, (HEMORRHOIDAL RELIEF) 5 % CREA Apply 1 g topically every 8 (eight) hours as needed. 04/02/21   Earlie Server, MD  lidocaine-prilocaine (EMLA) cream Apply to affected area once 03/15/21   Earlie Server, MD  ondansetron (ZOFRAN) 8 MG tablet Take 1 tablet (8 mg total) by mouth every 8 (eight) hours as needed for nausea or  vomiting. 02/28/21   Earlie Server, MD  oxyCODONE (OXY IR/ROXICODONE) 5 MG immediate release tablet Take 1 tablet (5 mg total) by mouth every 4 (four) hours as needed for moderate pain. 03/28/21   Jacquelin Hawking, NP  polyethylene glycol (MIRALAX / GLYCOLAX) 17 g packet Take 17 g dissolved in liquid by mouth 1 to 3 times a day as needed to prevent constipation. 04/17/21   Verlon Au, NP  predniSONE (DELTASONE) 20 MG tablet Take 5 tablets (100 mg total) by mouth daily. Take with food on days 2-5 of chemotherapy. 04/10/21   Earlie Server, MD  prochlorperazine (COMPAZINE) 10 MG tablet Take 1 tablet (10 mg total) by mouth every 6 (six) hours as needed for nausea or vomiting. Patient not taking: No sig reported 02/28/21   Earlie Server, MD  promethazine (PHENERGAN) 25 MG suppository Place 1 suppository (25 mg total) rectally every 6 (six) hours as needed for nausea or vomiting. 03/14/21   Earlie Server, MD    Allergies:  Allergies as of 05/07/2021 - Review Complete 04/27/2021  Allergen Reaction Noted   Lisinopril Cough 01/12/2016   Losartan Diarrhea 07/02/2019   Metformin and related  04/17/2017    ROS:  General: Denies weight loss, weight gain, fatigue, fevers, chills, and night sweats. Eyes: Denies blurry vision, double vision, eye pain, itchy eyes, and tearing. Ears: Denies hearing loss, earache, and ringing in ears. Nose: Denies sinus pain, congestion, infections, runny nose, and nosebleeds. Mouth/throat: Denies hoarseness, sore throat, bleeding gums, and difficulty swallowing. Heart: Denies chest pain, palpitations, racing heart, irregular heartbeat, leg pain or swelling, and decreased activity tolerance. Respiratory: Denies breathing difficulty, shortness of breath, wheezing, cough, and sputum. GI: Denies change in appetite, heartburn,  constipation, diarrhea, and blood in stool. GU: Denies difficulty urinating, pain with urinating, urgency, frequency, blood in urine. Musculoskeletal: Denies joint  stiffness, pain, swelling, muscle weakness. Skin: Denies rash, itching, mass, tumors, sores, and boils Neurologic: Denies headache, fainting, dizziness, seizures, numbness, and tingling. Psychiatric: Denies depression, anxiety, difficulty sleeping, and memory loss. Endocrine: Denies heat or cold intolerance, and increased thirst or urination. Blood/lymph: Denies easy bruising, easy bruising, and swollen glands     Objective:     BP (!) 89/66 (BP Location: Left Arm)   Pulse (!) 118   Temp 97.8 F (36.6 C) (Oral)   Resp 12   Ht 6\' 5"  (1.956 m)   Wt 124.9 kg   SpO2 93%   BMI 32.65 kg/m   Constitutional :  alert, cooperative, and pale  Lymphatics/Throat:  no asymmetry, masses, or scars  Respiratory:  clear to auscultation bilaterally  Cardiovascular:  Tachy rate  Gastrointestinal: Soft, no guarding, focal TTP in epigastric region .   Musculoskeletal: Steady movement  Skin: Cool and moist, no visible surgical scars   Psychiatric: Normal affect, non-agitated, not confused       LABS:  CMP Latest Ref Rng & Units 04/22/2021 04/17/2021 04/17/2021  Glucose 70 - 99 mg/dL 275(H) 130(H) 141(H)  BUN 6 - 20 mg/dL 9 19 24(H)  Creatinine 0.61 -  1.24 mg/dL 1.40(H) 0.75 0.77  Sodium 135 - 145 mmol/L 137 134(L) 134(L)  Potassium 3.5 - 5.1 mmol/L 3.2(L) 3.8 3.6  Chloride 98 - 111 mmol/L 101 101 99  CO2 22 - 32 mmol/L 17(L) 25 25  Calcium 8.9 - 10.3 mg/dL 8.3(L) 8.3(L) 8.1(L)  Total Protein 6.5 - 8.1 g/dL 5.9(L) 6.3(L) 6.0(L)  Total Bilirubin 0.3 - 1.2 mg/dL 0.4 1.0 0.4  Alkaline Phos 38 - 126 U/L 133(H) 88 101  AST 15 - 41 U/L 25 13(L) 15  ALT 0 - 44 U/L 19 15 15    CBC Latest Ref Rng & Units 04/23/2021 04/20/2021 04/18/2021  WBC 4.0 - 10.5 K/uL 42.8(H) 7.6 0.8(LL)  Hemoglobin 13.0 - 17.0 g/dL 9.1(L) 7.1(L) 7.1(L)  Hematocrit 39.0 - 52.0 % 29.0(L) 22.5(L) 21.7(L)  Platelets 150 - 400 K/uL 316 222 206    RADS: ADDENDUM REPORT: 04/17/2021 18:02   ADDENDUM: 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   These results were called by telephone at the time of interpretation on 04/17/2021 at 5:58 pm to provider Vascular surgery, who verbally acknowledged these results.     Electronically Signed   By: Iven Finn M.D.   On: 05/16/2021 18:02    Addended by Irving Burton, MD on 05/10/2021  6:05 PM   Study Result  Narrative & Impression  CLINICAL DATA:  GI Bleed protocol. vomiting bright red blood this afternoon and wife states pt did lose consciousness after vomitin   EXAM: CT ANGIOGRAPHY CHEST, ABDOMEN AND PELVIS   TECHNIQUE: Non-contrast CT of the chest was initially obtained.   Multidetector CT imaging through the chest, abdomen and pelvis was performed using the standard protocol during bolus administration of intravenous contrast. Multiplanar reconstructed images and MIPs were obtained and reviewed to evaluate the vascular anatomy.   CONTRAST:  40mL OMNIPAQUE IOHEXOL 350 MG/ML SOLN   COMPARISON:  PETCT 03/08/2021   FINDINGS: Lines and tubes: Right chest wall Port-A-Cath with tip terminating at the superior cavoatrial junction.   CTA CHEST FINDINGS   Cardiovascular: Preferential opacification of the thoracic aorta. No evidence of thoracic aortic aneurysm or dissection. Normal heart size. No significant pericardial effusion. The thoracic aorta is normal in caliber. No atherosclerotic plaque of the thoracic aorta. At least 2 vessel coronary artery calcifications.   Mediastinum/Nodes: No enlarged mediastinal, hilar, or axillary lymph nodes. Thyroid gland, trachea, and esophagus demonstrate no significant findings.   Lungs/Pleura: No focal consolidation. No pulmonary nodule. No pulmonary mass. No pleural effusion. No pneumothorax. Elevated left hemidiaphragm.   Musculoskeletal:   No chest wall abnormality.   No suspicious lytic or blastic osseous  lesions. No acute displaced fracture. Multilevel degenerative changes of the spine.   Review of the MIP images confirms the above findings.   CTA ABDOMEN AND PELVIS FINDINGS   VASCULAR   Limited evaluation of the upper abdomen due to streak artifact originating from bilateral upper extremities. No definite active hemorrhage noted.   Aorta: Atherosclerotic plaque. Normal caliber aorta without aneurysm, dissection, vasculitis or significant stenosis.   Celiac: Patent without evidence of aneurysm, dissection, vasculitis or significant stenosis.   SMA: Patent without evidence of aneurysm, dissection, vasculitis or significant stenosis.   Renals: Both renal arteries are patent without evidence of aneurysm, dissection, vasculitis, fibromuscular dysplasia or significant stenosis.   IMA: Patent without evidence of aneurysm, dissection, vasculitis or significant stenosis.   Inflow: Atherosclerotic plaque. Patent without evidence of aneurysm, dissection,  vasculitis or significant stenosis.   Veins: The main portal, splenic, superior mesenteric veins are patent.   Review of the MIP images confirms the above findings.   NON-VASCULAR   Hepatobiliary: No focal liver abnormality. No gallstones, gallbladder wall thickening, or pericholecystic fluid. No biliary dilatation.   Pancreas: Distal pancreas abuts the necrotizing mass. No focal lesion. Normal pancreatic contour. No surrounding inflammatory changes. No main pancreatic ductal dilatation.   Spleen: Peripheral spleen is unremarkable; however, the splenic hilum and central spleen fall are replaced with known ulcerative mass.   Adrenals/Urinary Tract: Subcentimeter hypodensities are too small   There is a 2 x 1.1 cm left adrenal gland nodule with a density of 93 Hounsfield units. No right adrenal nodule.   Bilateral kidneys enhance symmetrically.  To characterize.   No hydronephrosis. No hydroureter.   The urinary  bladder is unremarkable.   Stomach/Bowel: Similar-appearing large (at least 6.5 x 5.5 cm) left upper quadrant ulcerative necrotizing mass with direct communication to the fundal gastric lumen. The mass is again noted to also be inseparable from the splenic parenchyma and hilum. The lesion also abuts the distal pancreas and the left hemidiaphragm. Otherwise no evidence of small or large bowel wall thickening or dilatation. Stool throughout the colon. Appendix appears normal.   Lymphatic: No lymphadenopathy   Reproductive: Prostate is unremarkable.   Other: Trace left upper quadrant free fluid ascites. No intraperitoneal free gas. No organized fluid collection.   Musculoskeletal:   No abdominal wall hernia or abnormality.   No suspicious lytic or blastic osseous lesions. No acute displaced fracture. Multilevel degenerative changes of the spine.   Review of the MIP images confirms the above findings.   IMPRESSION: 1. Similar-appearing large left upper quadrant ulcerative necrotizing mass with direct communication to the fundal gastric lumen. No definite active hemorrhage with limited evaluation due to streak artifact originating from bilateral upper extremities. The mass is again noted to also be inseparable from the splenic parenchyma and hilum. The lesion also abuts the distal pancreas and the left hemidiaphragm. 2. Trace left upper quadrant free fluid ascites. 3. Indeterminate 2 cm left adrenal gland nodule. Finding better evaluated on PET CT where a left adrenal metastasis is suspected. 4. Scattered colonic diverticulosis with no acute diverticulitis.   Electronically Signed: By: Iven Finn M.D. 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  CT reviewed personally by myself and agree with report, with high concern parts of pancreas likely involved as  well.  Plan:   Currently patient is tachycardic, pale, but able to answer questions.  Feeling weak.  Wife at bedside as well.   Based on the CT findings, I explained to both of them only approach to prevent another bleeding episode or prevent a frank gastric perforation will be enbloc resection of the tumor with surrounding organs, which unfortunately will more than likely be incompatible with life.  Report of embolization also not an option noted as well.  With this grim prognosis, I explained to them that, supportive care will be least intrusive option and focusing on comfort is recommended since ultimate prognosis will not change with surgical intervention, and may even hasten his demise.    Wife was not ready to discuss code status or commiting to comfort care at time of discussion with myself, nor was patient ready to commit either.  Will recommend supportive care for now per ICU team.  ICU night team also notified of discussion and they verbalized understanding.  Surgery will be available for further questions or concerns if needed.

## 2021-04-24 NOTE — Progress Notes (Signed)
I conveyed the findings of the CT angio to patient's wife who was at bedside.  The patient was sleeping at the time.  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  The mass is necrotic and ulcerated.  The patient will likely get peritonitis this there is evidence of perforation.  The options in this circumstance are limited.  There is no evidence of active bleeding or anything to be embolized.  I discussed with Dr. Delana Meyer and he will remain available if needed however at present nothing that can be embolized.  Have discussed the case with Dr. Lysle Pearl of general surgery who is on-call.  He will evaluate the patient to see if there are any options that can be offered.  We will start meropenem and fluconazole empirically.  Prognosis is exceedingly guarded given the findings on CT angio.  Renold Don, MD Advanced Bronchoscopy [facility]-   *This note was dictated using voice recognition software/Dragon.  Despite best efforts to proofread, errors can occur which can change the meaning.  Any change was purely unintentional.

## 2021-04-24 NOTE — ED Notes (Addendum)
Pt vomiting medium amount of blood. MD made aware.

## 2021-04-24 NOTE — H&P (Signed)
NAME:  Zachary Perkins, MRN:  [id number], DOB:  April 20, 1961, LOS: 0 ADMISSION DATE:  05/04/2021, CONSULTATION DATE: 05/07/2021 REFERRING MD: Dr. Marjean Donna, CHIEF COMPLAINT: Vomiting blood  History of Present Illness:  Patient is a 60 year old presents to the [facility] ED with onset of vomiting blood at 2 PM today.  The patient was diagnosed in September with large B-cell lymphoma after EGD showed a large fungating mass in the stomach.  Patient also had active gastritis and duodenitis at the time of EGD on 21 February 2021.  The mass was described as fungating and with no active bleeding at the time.  Patient has had anemia associated with his lymphoma with hemoglobin running around 7.1 chronically.  He has received PRBC transfusions as well as IV Venofer during hospitalization in September.  Lymphoma has not had bone marrow involvement.  The patient has undergone 2 cycles of CHOP chemotherapeutic regimen.  He has also received intrathecal methotrexate for prophylaxis.  Today he developed vomiting blood at approximately 2 PM and has noted clots blood and whitish looking material.  He has had intractable nausea.  History is limited due to patient becoming nauseated and vomiting clots every time he tries to answer a question.  Patient's wife is with him and notes same history.  The patient has received emergent transfusion in the ED.  Of note his hemogram is still pending at the time of this dictation.  The patient is to have a stat CT CAP Angio and vascular has been consulted for potential embolization.  GI has been consulted however they defer EGD currently.  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  Only other complaint is that of fatigue.  No shortness of breath.  Not elaborate more due to intractable nausea.  Objective   Blood pressure 105/78, pulse (!) 106, temperature 98.2 F (36.8 C), temperature source Oral, resp. rate (!) 21, SpO2 96 %.       No intake or output data in the 24 hours ending 05/12/2021 1737 There were no vitals filed for this visit.  Examination: General: Pale, chronically ill-appearing gentleman, looks uncomfortable, retching repeatedly.  Emesis of clots and pink tissue. HEENT: Oral mucosa dry, some caked blood around the lips.  Pale conjunctiva. Lungs: Clear to auscultation bilaterally Cardiovascular: Tachycardic, regular rate, no rubs murmurs gallops heard. Abdomen: Obese, soft, somewhat hyperactive bowel sounds, epigastric tenderness.  No rebound. Extremities: No cyanosis clubbing or edema noted.  Pale nailbeds. Neuro: No overt focal deficit. GU: Deferred.  Resolved Hospital Problem list     Assessment & Plan:  Acute upper GI bleed Underlying anemia due to lymphoma/chronic blood loss Known gastric mass Hx: Large B-cell lymphoma Admit to ICU for hemodynamic monitoring H&H every 6h Transfuse for hemoglobin less than 7 GI consulted, deferring EGD at present Vascular consulted, will follow up on CT angio chest abdomen pelvis May need vascular embolization Control emesis Volume resuscitate  Large B-cell lymphoma stage IV Large fungating mass, gastric Patient currently under the care of oncology Will  consult oncology Status post CHOP x2 Prognosis guarded  Lactic acidosis due to volume depletion hemorrhagic shock Volume resuscitate Trend lactic acid Received transfusion H&H 9.1 and 29.0  currently Pressors if does not respond to fluids  Mild hypokalemia Replete   Best Practice (right click and "Reselect all SmartList Selections" daily)   Diet/type: NPO DVT prophylaxis: other SCDs  GI prophylaxis: PPI infusion Lines: N/A Foley:  N/A Code Status:  full code Last date of multidisciplinary goals of care discussion [N/A]  Labs   CBC: Recent Labs  Lab 04/17/21 1943 04/18/21 0756 04/20/21 0759 04/21/2021 1657  WBC 0.7* 0.8* 7.6 42.8*  NEUTROABS 0.2* 0.2* 5.4 PENDING  HGB 7.3* 7.1* 7.1* 9.1*  HCT 21.2* 21.7* 22.5* 29.0*  MCV 81.5 83.1 85.9 90.1  PLT 189 206 222 470    Basic Metabolic Panel: Recent Labs  Lab 04/17/21 1943 04/23/2021 1533  NA 134* 137  K 3.8 3.2*  CL 101 101  CO2 25 17*  GLUCOSE 130* 275*  BUN 19 9  CREATININE 0.75 1.40*  CALCIUM 8.3* 8.3*   GFR: Estimated Creatinine Clearance: 82.3 mL/min (A) (by C-G formula based on SCr of 1.4 mg/dL (H)). Recent Labs  Lab 04/17/21 1943 04/18/21 0756 04/20/21 0759 04/26/2021 1535 04/30/2021 1657  WBC 0.7* 0.8* 7.6  --  42.8*  LATICACIDVEN  --   --   --  >9.0*  --     Liver Function Tests: Recent Labs  Lab 04/17/21 1943 04/23/2021 1533  AST 13* 25  ALT 15 19  ALKPHOS 88 133*  BILITOT 1.0 0.4  PROT 6.3* 5.9*  ALBUMIN 3.1* 2.5*   Recent Labs  Lab 04/17/21 1943  LIPASE 19   No results for input(s): AMMONIA in the last 168 hours.  ABG No results found for: PHART, PCO2ART, PO2ART, HCO3, TCO2, ACIDBASEDEF, O2SAT   Coagulation Profile: Recent Labs  Lab 04/26/2021 1533  INR 1.2    Cardiac Enzymes: No results for input(s): CKTOTAL, CKMB, CKMBINDEX, TROPONINI in the last 168 hours.  HbA1C: Hgb A1c MFr Bld  Date/Time Value Ref Range Status  02/22/2021 07:46 AM 5.0 4.8 - 5.6 % Final    Comment:    (NOTE) Pre diabetes:          5.7%-6.4%  Diabetes:              >6.4%  Glycemic control for   <7.0% adults with diabetes   02/21/2021 06:19 AM 5.0 4.8 - 5.6 % Final    Comment:     (NOTE) Pre diabetes:          5.7%-6.4%  Diabetes:              >6.4%  Glycemic control for   <7.0% adults with diabetes     CBG: Recent Labs  Lab 04/23/21 0925  GLUCAP 91    Review of Systems:   A brief review of systems was performed and was negative except for as noted above.  The patient is with intractable nausea and unable to participate fully in ROS.  Past Medical History:  He,  has a past medical history of Diabetes mellitus without complication ([place]), Diffuse large B cell lymphoma ([place]), Hypertension, Hypothyroidism, and Morbid obesity ([place]).   Surgical History:   Past Surgical History:  Procedure Laterality Date   CARPAL TUNNEL RELEASE Left    ESOPHAGOGASTRODUODENOSCOPY (EGD) WITH PROPOFOL N/A 02/21/2021   Procedure: ESOPHAGOGASTRODUODENOSCOPY (EGD) WITH PROPOFOL;  Surgeon: Annamaria Helling, DO;  Location: [facility] ENDOSCOPY;  Service:  Gastroenterology;  Laterality: N/A;  after 2pm   INSERTION OF MESH N/A 05/02/2017   Procedure: INSERTION OF MESH;  Surgeon: Leonie Green, MD;  Location: [facility] ORS;  Service: General;  Laterality: N/A;   lasix eye surgery     PORTACATH PLACEMENT Right 03/09/2021   Procedure: INSERTION PORT-A-CATH;  Surgeon: Jules Husbands, MD;  Location: [facility] ORS;  Service: General;  Laterality: Right;   UMBILICAL HERNIA REPAIR N/A 05/02/2017   Procedure: HERNIA REPAIR UMBILICAL ADULT;  Surgeon: Leonie Green, MD;  Location: [facility] ORS;  Service: General;  Laterality: N/A;     Social History:   reports that he quit smoking about 14 years ago. His smoking use included cigarettes. He has a 20.00 pack-year smoking history. He has never used smokeless tobacco. He reports that he does not currently use alcohol. He reports that he does not use drugs.   Family History:  His family history includes COPD in his father; Cancer - Lung in his brother; Emphysema in his father.   Allergies Allergies  Allergen Reactions   Lisinopril Cough    Losartan Diarrhea   Metformin And Related     PASSED OUT     Home Medications  Prior to Admission medications   Medication Sig Start Date End Date Taking? Authorizing Provider  allopurinol (ZYLOPRIM) 100 MG tablet Take 1 tablet (100 mg total) by mouth daily. 04/11/21   Earlie Server, MD  bisacodyl (DULCOLAX) 10 MG suppository Place 1 suppository (10 mg total) rectally daily as needed for severe constipation. 02/26/21   Aline August, MD  chlorhexidine (PERIDEX) 0.12 % solution Use as directed 15 mLs in the mouth or throat 2 (two) times daily. 04/18/21   Earlie Server, MD  ciprofloxacin (CIPRO) 500 MG tablet Take 1 tablet (500 mg total) by mouth 2 (two) times daily. 04/17/21   Verlon Au, NP  docusate sodium (COLACE) 100 MG capsule Take 200 mg by mouth daily.    [provider]  gabapentin (NEURONTIN) 100 MG capsule Take 1 capsule (100 mg total) by mouth 3 (three) times daily. Patient taking differently: Take 100 mg by mouth 2 (two) times daily. 04/03/21   Earlie Server, MD  hydrochlorothiazide (HYDRODIURIL) 25 MG tablet Take 25 mg by mouth daily. Patient not taking: No sig reported    [provider]  levothyroxine (SYNTHROID) 300 MCG tablet Take 300 mcg by mouth daily. 02/12/21   [provider]  Lidocaine, Anorectal, (HEMORRHOIDAL RELIEF) 5 % CREA Apply 1 g topically every 8 (eight) hours as needed. 04/02/21   Earlie Server, MD  lidocaine-prilocaine (EMLA) cream Apply to affected area once 03/15/21   Earlie Server, MD  loratadine (CLARITIN) 10 MG tablet Take 10 mg by mouth daily. With GCSF injections    [provider]  Multiple Vitamin (MULTI-VITAMIN) tablet Take 1 tablet by mouth daily.    [provider]  ondansetron (ZOFRAN) 8 MG tablet Take 1 tablet (8 mg total) by mouth every 8 (eight) hours as needed for nausea or vomiting. 02/28/21   Earlie Server, MD  oxyCODONE (OXY IR/ROXICODONE) 5 MG immediate release tablet Take 1 tablet (5 mg total) by mouth every 4 (four) hours  as needed for moderate pain. 03/28/21   Jacquelin Hawking, NP  pantoprazole (PROTONIX) 40 MG tablet Take 1 tablet (40 mg total) by mouth 2 (two) times daily before a meal. 03/27/21   Earlie Server, MD  polyethylene glycol (MIRALAX / GLYCOLAX) 17 g packet Take 17 g dissolved in  liquid by mouth 1 to 3 times a day as needed to prevent constipation. 04/17/21   Verlon Au, NP  potassium chloride (KLOR-CON) 10 MEQ tablet Take 10 mEq by mouth daily. 01/02/21   [provider]  predniSONE (DELTASONE) 20 MG tablet Take 5 tablets (100 mg total) by mouth daily. Take with food on days 2-5 of chemotherapy. 04/10/21   Earlie Server, MD  prochlorperazine (COMPAZINE) 10 MG tablet Take 1 tablet (10 mg total) by mouth every 6 (six) hours as needed for nausea or vomiting. 02/28/21   Earlie Server, MD  promethazine (PHENERGAN) 25 MG suppository Place 1 suppository (25 mg total) rectally every 6 (six) hours as needed for nausea or vomiting. 03/14/21   Earlie Server, MD     Critical care time: 60 minutes   The patient is critically ill with multiple organ systems failure and requires high complexity decision making for assessment and support, frequent evaluation and titration of therapies, application of advanced monitoring technologies and extensive interpretation of multiple databases. Critical Care Time devoted to patient care services described in this note is 60 minutes.  Discussed with Dr. Delana Meyer.  Renold Don, MD Advanced Bronchoscopy P[facility]    *This note was dictated using voice recognition software/Dragon.  Despite best efforts to proofread, errors can occur which can change the meaning.  Any change was purely unintentional.

## 2021-04-24 NOTE — ED Notes (Signed)
GI MD at bedside

## 2021-04-24 NOTE — Consult Note (Signed)
Vonda Antigua, MD [address] [place], [place], [place], [address] [address], [place], [place], [place], [address] Phone: [phone]  Fax: [phone]  Consultation  Referring Provider:     Dr. Jari Pigg Primary Care Physician:  Sharyne Peach, MD Reason for Consultation:     Hematemesis  Date of Admission:  05/06/2021 Date of Consultation:  04/30/2021         HPI:   Zachary Perkins is a 60 y.o. male with recently diagnosed large B cell lymphoma now on endoscopy, presents with hematemesis.  Started having hematemesis this afternoon and has had multiple episodes since then.  Upper endoscopy done by Dr. Virgina Jock on 02/21/2021 for abnormal CT scan showed large fungating ulcerated mass in the stomach.  Patient has chronic anemia and has been requiring IV Venofer treatments by hematology oncology as an outpatient.  Patient reports 2 pills of Excedrin yesterday or today, but no other NSAIDs besides that.  Denies any melena.  Last bowel movement was yesterday and was brown in color.  Patient was hypertensive and on admission to the ER He just presented to the ER and is awaiting lab work and medical optimization at this time  Past Medical History:  Diagnosis Date  . Diabetes mellitus without complication ([place])   . Diffuse large B cell lymphoma ([place])   . Hypertension   . Hypothyroidism   . Morbid obesity ([place])     Past Surgical History:  Procedure Laterality Date  . CARPAL TUNNEL RELEASE Left   . ESOPHAGOGASTRODUODENOSCOPY (EGD) WITH PROPOFOL N/A 02/21/2021   Procedure: ESOPHAGOGASTRODUODENOSCOPY (EGD) WITH PROPOFOL;  Surgeon: Annamaria Helling, DO;  Location: [facility];  Service: Gastroenterology;  Laterality: N/A;  after 2pm  . INSERTION OF MESH N/A 05/02/2017   Procedure: INSERTION OF MESH;  Surgeon: Leonie Green, MD;  Location: [facility];  Service: General;  Laterality: N/A;  . lasix eye surgery    . PORTACATH PLACEMENT Right 03/09/2021   Procedure:  INSERTION PORT-A-CATH;  Surgeon: Jules Husbands, MD;  Location: [facility];  Service: General;  Laterality: Right;  . UMBILICAL HERNIA REPAIR N/A 05/02/2017   Procedure: HERNIA REPAIR UMBILICAL ADULT;  Surgeon: Leonie Green, MD;  Location: [facility];  Service: General;  Laterality: N/A;    Prior to Admission medications   Medication Sig Start Date End Date Taking? Authorizing Provider  allopurinol (ZYLOPRIM) 100 MG tablet Take 1 tablet (100 mg total) by mouth daily. 04/11/21   Earlie Server, MD  bisacodyl (DULCOLAX) 10 MG suppository Place 1 suppository (10 mg total) rectally daily as needed for severe constipation. 02/26/21   Aline August, MD  chlorhexidine (PERIDEX) 0.12 % solution Use as directed 15 mLs in the mouth or throat 2 (two) times daily. 04/18/21   Earlie Server, MD  ciprofloxacin (CIPRO) 500 MG tablet Take 1 tablet (500 mg total) by mouth 2 (two) times daily. 04/17/21   Verlon Au, NP  docusate sodium (COLACE) 100 MG capsule Take 200 mg by mouth daily.    [provider]  gabapentin (NEURONTIN) 100 MG capsule Take 1 capsule (100 mg total) by mouth 3 (three) times daily. Patient taking differently: Take 100 mg by mouth 2 (two) times daily. 04/03/21   Earlie Server, MD  hydrochlorothiazide (HYDRODIURIL) 25 MG tablet Take 25 mg by mouth daily. Patient not taking: No sig reported    [provider]  levothyroxine (SYNTHROID) 300 MCG tablet Take 300 mcg by mouth daily. 02/12/21   [provider]  Lidocaine, Anorectal, (HEMORRHOIDAL RELIEF) 5 % CREA Apply 1 g topically every 8 (eight) hours as needed. 04/02/21   Earlie Server, MD  lidocaine-prilocaine (EMLA) cream Apply to affected area once 03/15/21   Earlie Server, MD  loratadine (CLARITIN) 10 MG tablet Take 10 mg by mouth daily. With GCSF injections    [provider]  Multiple Vitamin (MULTI-VITAMIN) tablet Take 1 tablet by mouth daily.    [provider]  ondansetron (ZOFRAN) 8 MG tablet Take 1 tablet (8 mg  total) by mouth every 8 (eight) hours as needed for nausea or vomiting. 02/28/21   Earlie Server, MD  oxyCODONE (OXY IR/ROXICODONE) 5 MG immediate release tablet Take 1 tablet (5 mg total) by mouth every 4 (four) hours as needed for moderate pain. 03/28/21   Jacquelin Hawking, NP  pantoprazole (PROTONIX) 40 MG tablet Take 1 tablet (40 mg total) by mouth 2 (two) times daily before a meal. 03/27/21   Earlie Server, MD  polyethylene glycol (MIRALAX / GLYCOLAX) 17 g packet Take 17 g dissolved in liquid by mouth 1 to 3 times a day as needed to prevent constipation. 04/17/21   Verlon Au, NP  potassium chloride (KLOR-CON) 10 MEQ tablet Take 10 mEq by mouth daily. 01/02/21   [provider]  predniSONE (DELTASONE) 20 MG tablet Take 5 tablets (100 mg total) by mouth daily. Take with food on days 2-5 of chemotherapy. 04/10/21   Earlie Server, MD  prochlorperazine (COMPAZINE) 10 MG tablet Take 1 tablet (10 mg total) by mouth every 6 (six) hours as needed for nausea or vomiting. 02/28/21   Earlie Server, MD  promethazine (PHENERGAN) 25 MG suppository Place 1 suppository (25 mg total) rectally every 6 (six) hours as needed for nausea or vomiting. 03/14/21   Earlie Server, MD    Family History  Problem Relation Age of Onset  . COPD Father   . Emphysema Father   . Cancer - Lung Brother      Social History   Tobacco Use  . Smoking status: Former    Packs/day: 1.00    Years: 20.00    Pack years: 20.00    Types: Cigarettes    Quit date: 04/25/2007    Years since quitting: 14.0  . Smokeless tobacco: Never  Vaping Use  . Vaping Use: Never used  Substance Use Topics  . Alcohol use: Not Currently    Comment: rare  . Drug use: No    Allergies as of 05/04/2021 - Review Complete 04/23/2021  Allergen Reaction Noted  . Lisinopril Cough 01/12/2016  . Losartan Diarrhea 07/02/2019  . Metformin and related  04/17/2017    Review of Systems:    All systems reviewed and negative except where noted in HPI.   Physical  Exam:  Constitutional: General:   Alert,  Well-developed, well-nourished, pleasant and cooperative in NAD BP 112/63   Pulse 96   Temp 98.2 F (36.8 C) (Oral)   Resp 15   SpO2 96%   Eyes:  Sclera clear, no icterus.   Conjunctiva pink. PERRLA  Ears:  No scars, lesions or masses, Normal auditory acuity. Nose:  No deformity, discharge, or lesions. Mouth:  No deformity or lesions, oropharynx pink & moist.  Neck:  Supple; no masses or thyromegaly.  Respiratory: Normal respiratory effort, Normal percussion  Gastrointestinal:  Normal bowel sounds.  No bruits.  Soft, non-tender and non-distended without masses, hepatosplenomegaly or hernias noted.  No guarding or rebound tenderness.  Cardiac: No clubbing or edema.  No cyanosis. Normal posterior tibial pedal pulses noted.  Lymphatic:  No significant cervical or axillary adenopathy.  Psych:  Alert and cooperative. Normal mood and affect.  Musculoskeletal:  Normal gait. Head normocephalic, atraumatic. Symmetrical without gross deformities. 5/5 Upper and Lower extremity strength bilaterally.  Skin: Warm. Intact without significant lesions or rashes. No jaundice.  Neurologic:  Face symmetrical, tongue midline, Normal sensation to touch;  grossly normal neurologically.  Psych:  Alert and oriented x3, Alert and cooperative. Normal mood and affect.   LAB RESULTS: No results for input(s): WBC, HGB, HCT, PLT in the last 72 hours. BMET No results for input(s): NA, K, CL, CO2, GLUCOSE, BUN, CREATININE, CALCIUM in the last 72 hours. LFT No results for input(s): PROT, ALBUMIN, AST, ALT, ALKPHOS, BILITOT, BILIDIR, IBILI in the last 72 hours. PT/INR Recent Labs    05/15/2021 1533  LABPROT 15.0  INR 1.2    STUDIES: NM PET Image Restag (PS) Skull Base To Thigh  Result Date: 04/23/2021 CLINICAL DATA:  Subsequent treatment strategy for diffuse large B-cell lymphoma. Chemotherapy 03/09/2021. EXAM: NUCLEAR MEDICINE PET SKULL BASE TO THIGH  TECHNIQUE: 14.5 mCi F-18 FDG was injected intravenously. Full-ring PET imaging was performed from the skull base to thigh after the radiotracer. CT data was obtained and used for attenuation correction and anatomic localization. Fasting blood glucose: 91 mg/dl COMPARISON:  03/08/2021 FINDINGS: Mediastinal blood pool activity: SUV max 2.0 Liver activity: SUV max 3.5 NECK: No areas of abnormal hypermetabolism. Incidental CT findings: No cervical adenopathy. CHEST: No pulmonary parenchymal or thoracic nodal hypermetabolism. Incidental CT findings: Right Port-A-Cath tip high right atrium. Aortic and coronary artery calcification. Trace left pleural fluid, decreased. ABDOMEN/PELVIS: No abdominopelvic nodal hypermetabolism. The cavitary mass centered about the greater curvature of the stomach with extension into the gastrosplenic ligament and splenic hilum is decreased. Difficult to measure secondary to its amorphous appearance. Correlate hypermetabolism along the periphery, including at a S.U.V. max of 8.0. Compare a S.U.V. max of 33.5 on the prior exam. Mild left adrenal thickening with hypermetabolism at a S.U.V. max of 3.0 cm today versus a S.U.V. max of 4.7 on the prior exam. Focal anal hypermetabolism including at a S.U.V. max of 11.9 on 286/3. This area is suboptimally evaluated on CT images secondary to underdistention. Incidental CT findings: Aortic atherosclerosis. Normal noncontrast appearance of the liver, gallbladder, right adrenal gland, kidneys. Pancreatic atrophy. Abdominal aortic atherosclerosis. Small abdominal retroperitoneal and porta hepatis nodes. No pelvic sidewall adenopathy. Trace right pelvic fluid including on 259/3, increased. SKELETON: No abnormal marrow activity. Incidental CT findings: none IMPRESSION: 1. Response to therapy of dominant left upper quadrant cavitary mass, involving the greater curvature of the stomach and extending into the gastrosplenic ligament and splenic hilum.  ([place]) 5 2. Persistent left adrenal hypermetabolism and mild thickening, nonspecific. 3. Focal low anal hypermetabolism for which polyp or synchronous carcinoma are possible. Consider physical exam or sigmoidoscopy. 4. Decreased trace left pleural fluid. Increased trace right pelvic fluid. 5. Incidental findings, including: Coronary artery atherosclerosis. Aortic Atherosclerosis (ICD10-I70.0). Electronically Signed   By: Abigail Miyamoto M.D.   On: 04/23/2021 15:22   DG Chest Portable 1 View  Result Date: 05/11/2021 CLINICAL DATA:  Gastrointestinal bleed. vomiting bright red blood this afternoon and wife states pt did lose consciousness after vomiting. EXAM: PORTABLE CHEST 1 VIEW COMPARISON:  Chest x-ray 03/27/2021 FINDINGS: Right chest wall Port-A-Cath with tip overlying the right atrium. Prominent cardiac silhouette which may be due to AP portable  technique. The heart and mediastinal contours are within normal limits. Aortic calcification. No focal consolidation. No pulmonary edema. No pleural effusion. No pneumothorax. No acute osseous abnormality. IMPRESSION: No active disease. Electronically Signed   By: Iven Finn M.D.   On: 04/18/2021 16:09      Impression / Plan:   Zachary Perkins is a 60 y.o. y/o male with history of recently diagnosed large B-cell lymphoma, with large ulcerated fungating mass in the stomach, with presentation for hematemesis over the last few hours  Source of bleeding is likely his large fungating and ulcerated mass  He has symptomatic anemia chronically for which he requires IV Venofer treatments as an outpatient and has required PRBC transfusion as an outpatient as well  Patient will need medical optimization prior to endoscopy.  Currently all labs are pending.  Patient will require IV fluid resuscitation to improve his blood pressure, possible PRBC transfusion  Once medically optimized, can proceed with endoscopy for further evaluation of upper GI  bleed  However, if resuscitative measures do not lead to improvement in hemodynamic status, and patient continues to have hematemesis, we may need to consider endoscopy sooner  Guidelines and literature recommends medical optimization prior to endoscopy for improved patient outcomes as well  Patient ate solid food around 2 PM today, and if endoscopy is done more emergently, than allowing for medical optimization, patient will need to be intubated  In addition, proceeding with upper endoscopy without allowing for medical optimization and IV Protonix, may be a limited exam with a stomach full of blood at this time, causing limited visibility and inability to intervene therapeutically in that setting  PPI IV twice daily  Continue serial CBCs and transfuse PRN Avoid NSAIDs Maintain 2 large-bore IV lines Please page GI on call with any acute hemodynamic changes, or signs of active GI bleeding  GI bleeding from large malignant masses can be difficult to control endoscopically and therefore, I have discussed with Dr. Jari Pigg to make vascular surgery and general surgery aware about the patient as well, in case patient has brisk bleeding that cannot be controlled via endoscopy  Pt will be followed by one of the other [facility] providers over the week as I will be on vacation  Thank you for involving me in the care of this patient.      LOS: 0 days   Virgel Manifold, MD  04/28/2021, 4:18 PM

## 2021-04-25 ENCOUNTER — Inpatient Hospital Stay: Payer: BC Managed Care – PPO

## 2021-04-25 ENCOUNTER — Inpatient Hospital Stay: Payer: BC Managed Care – PPO | Admitting: Oncology

## 2021-04-25 DIAGNOSIS — K922 Gastrointestinal hemorrhage, unspecified: Secondary | ICD-10-CM | POA: Diagnosis not present

## 2021-04-25 DIAGNOSIS — C8330 Diffuse large B-cell lymphoma, unspecified site: Secondary | ICD-10-CM

## 2021-04-25 DIAGNOSIS — R579 Shock, unspecified: Secondary | ICD-10-CM | POA: Diagnosis not present

## 2021-04-25 LAB — BPAM RBC
Blood Product Expiration Date: 202211222359
Blood Product Expiration Date: 202211282359
ISSUE DATE / TIME: 202211081543
ISSUE DATE / TIME: 202211081543
Unit Type and Rh: 5100
Unit Type and Rh: 9500

## 2021-04-25 LAB — TYPE AND SCREEN
ABO/RH(D): O POS
Antibody Screen: NEGATIVE
Unit division: 0
Unit division: 0

## 2021-04-25 LAB — CBC
HCT: 17.7 % — ABNORMAL LOW (ref 39.0–52.0)
Hemoglobin: 5.6 g/dL — ABNORMAL LOW (ref 13.0–17.0)
MCH: 28.1 pg (ref 26.0–34.0)
MCHC: 31.6 g/dL (ref 30.0–36.0)
MCV: 88.9 fL (ref 80.0–100.0)
Platelets: 327 10*3/uL (ref 150–400)
RBC: 1.99 MIL/uL — ABNORMAL LOW (ref 4.22–5.81)
RDW: 20.1 % — ABNORMAL HIGH (ref 11.5–15.5)
WBC: 29.5 10*3/uL — ABNORMAL HIGH (ref 4.0–10.5)
nRBC: 0.4 % — ABNORMAL HIGH (ref 0.0–0.2)

## 2021-04-25 LAB — MAGNESIUM: Magnesium: 1.7 mg/dL (ref 1.7–2.4)

## 2021-04-25 LAB — PHOSPHORUS: Phosphorus: 5.7 mg/dL — ABNORMAL HIGH (ref 2.5–4.6)

## 2021-04-25 LAB — BASIC METABOLIC PANEL
Anion gap: 9 (ref 5–15)
BUN: 25 mg/dL — ABNORMAL HIGH (ref 6–20)
CO2: 22 mmol/L (ref 22–32)
Calcium: 7 mg/dL — ABNORMAL LOW (ref 8.9–10.3)
Chloride: 107 mmol/L (ref 98–111)
Creatinine, Ser: 1.67 mg/dL — ABNORMAL HIGH (ref 0.61–1.24)
GFR, Estimated: 47 mL/min — ABNORMAL LOW (ref >60)
Glucose, Bld: 225 mg/dL — ABNORMAL HIGH (ref 70–99)
Potassium: 6.3 mmol/L (ref 3.5–5.1)
Sodium: 138 mmol/L (ref 135–145)

## 2021-04-25 LAB — PREPARE RBC (CROSSMATCH)

## 2021-04-25 MED ORDER — NOREPINEPHRINE 4 MG/250ML-% IV SOLN
0.0000 ug/min | INTRAVENOUS | Status: DC
  Administered 2021-04-25: 2 ug/min via INTRAVENOUS

## 2021-04-25 MED ORDER — INSULIN ASPART 100 UNIT/ML IV SOLN
10.0000 [IU] | Freq: Once | INTRAVENOUS | Status: AC
  Administered 2021-04-25: 10 [IU] via INTRAVENOUS
  Filled 2021-04-25: qty 0.1

## 2021-04-25 MED ORDER — NOREPINEPHRINE 4 MG/250ML-% IV SOLN
INTRAVENOUS | Status: AC
  Filled 2021-04-25: qty 250

## 2021-04-25 MED ORDER — CALCIUM GLUCONATE-NACL 1-0.675 GM/50ML-% IV SOLN
1.0000 g | Freq: Once | INTRAVENOUS | Status: AC
  Administered 2021-04-25: 1000 mg via INTRAVENOUS
  Filled 2021-04-25: qty 50

## 2021-04-25 MED ORDER — DEXTROSE 50 % IV SOLN
50.0000 mL | Freq: Once | INTRAVENOUS | Status: AC
  Administered 2021-04-25: 50 mL via INTRAVENOUS
  Filled 2021-04-25: qty 50

## 2021-04-25 MED ORDER — MIDAZOLAM HCL 2 MG/2ML IJ SOLN: 2.0000 mg | INTRAMUSCULAR | Status: DC | PRN

## 2021-04-25 MED ORDER — MORPHINE SULFATE (PF) 2 MG/ML IV SOLN
2.0000 mg | INTRAVENOUS | Status: DC | PRN
Start: 2021-04-25 — End: 2021-04-25
  Administered 2021-04-25: 2 mg via INTRAVENOUS
  Filled 2021-04-25: qty 1

## 2021-04-25 MED ORDER — SODIUM BICARBONATE 8.4 % IV SOLN
50.0000 meq | Freq: Once | INTRAVENOUS | Status: AC
  Administered 2021-04-25: 50 meq via INTRAVENOUS
  Filled 2021-04-25: qty 50

## 2021-04-29 LAB — CULTURE, BLOOD (ROUTINE X 2)
Culture: NO GROWTH
Culture: NO GROWTH
Special Requests: ADEQUATE

## 2021-05-01 ENCOUNTER — Other Ambulatory Visit: Payer: BC Managed Care – PPO

## 2021-05-01 ENCOUNTER — Ambulatory Visit: Payer: BC Managed Care – PPO | Admitting: Oncology

## 2021-05-01 ENCOUNTER — Ambulatory Visit: Payer: BC Managed Care – PPO

## 2021-05-02 ENCOUNTER — Ambulatory Visit: Payer: BC Managed Care – PPO

## 2021-05-03 ENCOUNTER — Ambulatory Visit: Payer: BC Managed Care – PPO

## 2021-05-17 NOTE — Progress Notes (Signed)
NAME:  Zachary Perkins, MRN:  [id number], DOB:  1961/02/09, LOS: 1 ADMISSION DATE:  04/27/2021  BRIEF SYNOPSIS  History of Present Illness:  60 year old presents to the [facility] ED with onset of vomiting blood at 2 PM today.  The patient was diagnosed in September with large B-cell lymphoma after EGD showed a large fungating mass in the stomach.  Patient also had active gastritis and duodenitis at the time of EGD on 21 February 2021.  The mass was described as fungating and with no active bleeding at the time.  Patient has had anemia associated with his lymphoma with hemoglobin running around 7.1 chronically.  He has received PRBC transfusions as well as IV Venofer during hospitalization in September.  Lymphoma has not had bone marrow involvement.  The patient has undergone 2 cycles of CHOP chemotherapeutic regimen.  He has also received intrathecal methotrexate for prophylaxis.   Today he developed vomiting blood at approximately 2 PM and has noted clots blood and whitish looking material.  He has had intractable nausea.  History is limited due to patient becoming nauseated and vomiting clots every time he tries to answer a question.  Patient's wife is with him and notes same history.  The patient has received emergent transfusion in the ED.  Of note his hemogram is still pending at the time of this dictation.   The patient is to have a stat CT CAP Angio and vascular has been consulted for potential embolization.  GI has been consulted however they defer EGD currently.   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  The mass is necrotic and ulcerated.   The patient will likely get peritonitis this there is evidence of perforation.     Antimicrobials:   Antibiotics Given (last 72 hours)     Date/Time Action Medication Dose Rate   05/05/2021 2152 New Bag/Given   meropenem (MERREM) 1 g in sodium chloride 0.9 % 100 mL IVPB 1 g 200 mL/hr   04-27-21 0457 New Bag/Given   meropenem (MERREM) 1 g in sodium chloride 0.9 % 100 mL IVPB 1 g 200 mL/hr            Interim History / Subjective:  Remains on pressors  Critically ill Prognosis is grave   Diagnosis(es): Acute upper GI bleed with hemorrhagic shock Underlying anemia due to lymphoma/chronic blood loss Known gastric mass Hx: Large B-cell lymphoma CT findings: Large left upper quadrant ulcerative necrotizing mass with direct communication to the fundal gastric lumen. The patient will likely get peritonitis there is evidence of perforation  PATIENT WAS MADE DNR/DNI   Objective   Blood pressure (!) 87/62, pulse (!) 109, temperature (!) 97.1 F (36.2 C), temperature source Axillary, resp. rate (!) 22, height 6\' 5"  (1.956 m), weight 124.9 kg, SpO2 100 %.        Intake/Output Summary (Last 24 hours) at April 27, 2021 0719 Last data filed at 04/27/2021 0500 Gross per 24 hour  Intake 1180.76 ml  Output 675 ml  Net 505.76 ml   Filed Weights   05/09/2021 1810  Weight: 124.9 kg        Labs/imaging that I havepersonally  reviewed  (right click and "Reselect all SmartList Selections" daily)       ASSESSMENT AND PLAN SYNOPSIS  Acute upper GI bleed Underlying anemia due to lymphoma/chronic blood loss Known gastric mass Hx: Large B-cell lymphoma With ABD perforation and necrotic abd mass   Large B-cell lymphoma stage IV Large fungating mass, gastric  Lactic acidosis due to volume depletion hemorrhagic shock SEPTIC shock -use vasopressors to keep MAP>65 as needed -follow ABG and LA as needed -follow up cultures -emperic ABX   CARDIAC ICU monitoring   ACUTE  KIDNEY INJURY/Renal Failure -continue Foley Catheter-assess need -Avoid nephrotoxic agents -Follow urine output, BMP -Ensure adequate renal perfusion, optimize oxygenation -Renal dose medications   Intake/Output Summary (Last 24 hours) at 2021/05/12 0719 Last data filed at 05-12-2021 0500 Gross per 24 hour  Intake 1180.76 ml  Output 675 ml  Net 505.76 ml   BMP Latest Ref Rng & Units 05-12-2021 05/15/2021 04/17/2021  Glucose 70 - 99 mg/dL 225(H) 275(H) 130(H)  BUN 6 - 20 mg/dL 25(H) 9 19  Creatinine 0.61 - 1.24 mg/dL 1.67(H) 1.40(H) 0.75  Sodium 135 - 145 mmol/L 138 137 134(L)  Potassium 3.5 - 5.1 mmol/L 6.3(HH) 3.2(L) 3.8  Chloride 98 - 111 mmol/L 107 101 101  CO2 22 - 32 mmol/L 22 17(L) 25  Calcium 8.9 - 10.3 mg/dL 7.0(L) 8.3(L) 8.3(L)     ENDO - ICU hypoglycemic\Hyperglycemia protocol -check FSBS per protocol   GI GI PROPHYLAXIS as indicated  NUTRITIONAL STATUS DIET-->NPO Constipation protocol as indicated   ELECTROLYTES -follow labs as needed -replace as needed -pharmacy consultation and following   Best Practice (right click and "Reselect all SmartList Selections" daily)    Diet/type: NPO DVT prophylaxis: other SCDs  GI prophylaxis: PPI infusion Lines: N/A Foley:  N/A Code Status:  DNR/DNI    Labs   CBC: Recent Labs  Lab 04/20/21 0759 05/03/2021 1657 05/01/2021 2225 2021-05-12 0540  WBC 7.6 42.8*  --  29.5*  NEUTROABS 5.4 34.6*  --   --   HGB 7.1* 9.1* 7.7* 5.6*  HCT 22.5* 29.0* 23.8* 17.7*  MCV 85.9 90.1  --  88.9  PLT 222 316  --  937    Basic Metabolic Panel: Recent Labs  Lab 05/12/2021 1533 05-12-21 0540  NA 137 138  K 3.2* 6.3*  CL 101 107  CO2 17* 22  GLUCOSE 275* 225*  BUN 9 25*  CREATININE 1.40* 1.67*  CALCIUM 8.3* 7.0*  MG  --  1.7  PHOS  --  5.7*   GFR: Estimated Creatinine Clearance: 69.7 mL/min (A) (by C-G formula based on SCr of 1.67 mg/dL (H)). Recent Labs  Lab 04/20/21 0759 05/08/2021 1535 04/29/2021 1657 05-12-2021 0540   WBC 7.6  --  42.8* 29.5*  LATICACIDVEN  --  >9.0* 5.6*  --     Liver Function Tests: Recent Labs  Lab 05/06/2021 1533  AST 25  ALT 19  ALKPHOS 133*  BILITOT 0.4  PROT 5.9*  ALBUMIN 2.5*   No results for input(s): LIPASE, AMYLASE in the last 168 hours. No results for input(s): AMMONIA in the last 168 hours.  ABG No results found for: PHART, PCO2ART, PO2ART, HCO3, TCO2, ACIDBASEDEF, O2SAT   Coagulation Profile: Recent Labs  Lab 04/29/2021 1533  INR 1.2    Cardiac Enzymes: No results for input(s): CKTOTAL, CKMB, CKMBINDEX, TROPONINI in the last 168 hours.  HbA1C: Hgb A1c MFr Bld  Date/Time Value Ref Range Status  02/22/2021 07:46 AM 5.0 4.8 - 5.6 %  Final    Comment:    (NOTE) Pre diabetes:          5.7%-6.4%  Diabetes:              >6.4%  Glycemic control for   <7.0% adults with diabetes   02/21/2021 06:19 AM 5.0 4.8 - 5.6 % Final    Comment:    (NOTE) Pre diabetes:          5.7%-6.4%  Diabetes:              >6.4%  Glycemic control for   <7.0% adults with diabetes     CBG: Recent Labs  Lab 04/23/21 0925 05/02/2021 1812  GLUCAP 91 175*    Allergies Allergies  Allergen Reactions   Lisinopril Cough   Losartan Diarrhea   Metformin And Related     PASSED OUT       DVT/GI PRX  assessed I Assessed the need for Labs I Assessed the need for Foley I Assessed the need for Central Venous Line Family Discussion when available I Assessed the need for Mobilization I made an Assessment of medications to be adjusted accordingly Safety Risk assessment completed  CASE DISCUSSED IN MULTIDISCIPLINARY ROUNDS WITH ICU TEAM     Critical Care Time devoted to patient care services described in this note is 55 minutes.  Critical care was necessary to treat or prevent imminent or life-threatening deterioration.   PATIENT WITH VERY POOR PROGNOSIS I ANTICIPATE PROLONGED ICU LOS  Patient with Multiorgan failure and at high risk for cardiac arrest and death.     Corrin Parker, M.D.  [facility]  Medical Director [place] Director [facility] Cardio-Pulmonary Department

## 2021-05-17 NOTE — Progress Notes (Signed)
Critical K 6.3 called from lab. Notified Rufina Falco, DNP of critical value.

## 2021-05-17 NOTE — Progress Notes (Signed)
Notified Elizabeth, DNP of bp 60/39 map 47. After conversation w/ patient regarding his desire to start medication to support blood pressure, he decided that he does want medication to support his bp.  0500- See new order . Levophed started per order. Bp up to 81/56 map 64.  0515- Notified wife, Zachary Perkins of changes to bp and that the medication, Levophed was started. Also updated on all events of the night which included 3 episodes of hematemesis, nausea and pain. See EMAR for interventions and times of medications given.  Continuing to monitor .

## 2021-05-17 NOTE — Death Summary Note (Signed)
DEATH SUMMARY   Patient Details  Name: Zachary Perkins MRN: [id number] DOB: December 27, 1960  Admission/Discharge Information   Admit Date:  2021-05-18  Date of Death:  05/19/21   Time of Death:  0900  Length of Stay: 1  Referring Physician: Sharyne Peach, MD    Diagnoses  Preliminary cause of death: STAGE 4 GASTRIC LYMPHOMA Secondary Diagnoses (including complications and co-morbidities):  Active Problems:   Upper GI bleed   Shock ([place])   60 year old presents to the [facility] ED with onset of vomiting blood at 2 PM today.  The patient was diagnosed in September with large B-cell lymphoma after EGD showed a large fungating mass in the stomach.  Patient also had active gastritis and duodenitis at the time of EGD on 21 February 2021.  The mass was described as fungating and with no active bleeding at the time.  Patient has had anemia associated with his lymphoma with hemoglobin running around 7.1 chronically.  He has received PRBC transfusions as well as IV Venofer during hospitalization in September.  Lymphoma has not had bone marrow involvement.  The patient has undergone 2 cycles of CHOP chemotherapeutic regimen.  He has also received intrathecal methotrexate for prophylaxis.   Today he developed vomiting blood at approximately 2 PM and has noted clots blood and whitish looking material.  He has had intractable nausea.  History is limited due to patient becoming nauseated and vomiting clots every time he tries to answer a question.  Patient's wife is with him and notes same history.  The patient has received emergent transfusion in the ED.  Of note his hemogram is still pending at the time of this dictation.   The patient is to have a stat CT CAP Angio and vascular has been consulted for potential embolization.  GI has been consulted however they defer EGD currently.   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  The mass is necrotic and ulcerated.  The patient will likely get peritonitis this there is evidence of perforation.        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, gastric   Lactic acidosis due to volume depletion hemorrhagic shock SEPTIC shock -use vasopressors to keep MAP>65 as needed -follow ABG and LA as needed -follow up cultures -emperic ABX     CARDIAC ICU monitoring     ACUTE KIDNEY INJURY/Renal Failure -continue Foley Catheter-assess need -Avoid nephrotoxic agents -Follow urine output, BMP -Ensure adequate renal perfusion, optimize oxygenation -Renal dose medications     Intake/Output Summary (Last 24 hours) at 2021-05-19  0719 Last data filed at 05/12/21 0500    Gross per 24 hour  Intake 1180.76 ml  Output 675 ml  Net 505.76 ml    BMP Latest Ref Rng & Units 05/12/21 05/13/2021 04/17/2021  Glucose 70 - 99 mg/dL 225(H) 275(H) 130(H)  BUN 6 - 20 mg/dL 25(H) 9 19  Creatinine 0.61 - 1.24 mg/dL 1.67(H) 1.40(H) 0.75  Sodium 135 - 145 mmol/L 138 137 134(L)  Potassium 3.5 -  5.1 mmol/L 6.3(HH) 3.2(L) 3.8  Chloride 98 - 111 mmol/L 107 101 101  CO2 22 - 32 mmol/L 22 17(L) 25  Calcium 8.9 - 10.3 mg/dL 7.0(L) 8.3(L) 8.3(L)        ENDO - ICU hypoglycemic\Hyperglycemia protocol -check FSBS per protocol     GI GI PROPHYLAXIS as indicated   NUTRITIONAL STATUS DIET-->NPO Constipation protocol as indicated   The Clinical status was relayed to family in detail. Wife at bedsie Updated and notified of patients medical condition.       Patient remains unresponsive and will not open eyes to command.   Patient is having a weak cough and struggling to remove secretions.   Patient with increased WOB and using accessory muscles to breathe Explained to family course of therapy and the modalities      Patient with Progressive multiorgan failure with a very high probablity of a very minimal chance of meaningful recovery despite all aggressive and optimal medical therapy.      Family understands the situation.   Wife has consented and agreed to DNR/DNI  Will add Morphine and Versed as needed Patient is in the dying process      Pertinent Labs and Studies  Significant Diagnostic Studies CT HEAD WO CONTRAST (5MM)  Result Date: 05/04/2021 CLINICAL DATA:  Mental status change.  Unknown cause. EXAM: CT HEAD WITHOUT CONTRAST TECHNIQUE: Contiguous axial images were obtained from the base of the skull through the vertex without intravenous contrast. COMPARISON:  None FINDINGS: Brain: No evidence of large-territorial acute infarction. No parenchymal hemorrhage. No mass lesion. No extra-axial collection. No mass effect or midline shift. No hydrocephalus. Basilar cisterns are patent. Enlarged pituitary gland measuring up to at least 1.3 cm. Vascular: No hyperdense vessel. Skull: No acute fracture or focal lesion. Sinuses/Orbits: Paranasal sinuses and mastoid air cells are clear. The orbits are unremarkable. Other: None. IMPRESSION: 1. Enlarged pituitary gland. Recommend MRI  sella protocol further evaluation. 2. Otherwise no acute intracranial abnormality. Electronically Signed   By: Iven Finn M.D.   On: 05/01/2021 17:57   NM PET Image Restag (PS) Skull Base To Thigh  Result Date: 04/23/2021 CLINICAL DATA:  Subsequent treatment strategy for diffuse large B-cell lymphoma. Chemotherapy 03/09/2021. EXAM: NUCLEAR MEDICINE PET SKULL BASE TO THIGH TECHNIQUE: 14.5 mCi F-18 FDG was injected intravenously. Full-ring PET imaging was performed from the skull base to thigh after the radiotracer. CT data was obtained and used for attenuation correction and anatomic localization. Fasting blood glucose: 91 mg/dl COMPARISON:  03/08/2021 FINDINGS: Mediastinal blood pool activity: SUV max 2.0 Liver activity: SUV max 3.5 NECK: No areas of abnormal hypermetabolism. Incidental CT findings: No cervical adenopathy. CHEST: No pulmonary parenchymal or thoracic nodal hypermetabolism. Incidental CT findings: Right Port-A-Cath tip high right atrium. Aortic and coronary artery calcification. Trace left pleural fluid, decreased. ABDOMEN/PELVIS: No abdominopelvic nodal hypermetabolism. The cavitary mass centered about the greater curvature of the stomach with extension into the gastrosplenic ligament and splenic hilum is decreased. Difficult to measure secondary to its amorphous appearance. Correlate hypermetabolism along  the periphery, including at a S.U.V. max of 8.0. Compare a S.U.V. max of 33.5 on the prior exam. Mild left adrenal thickening with hypermetabolism at a S.U.V. max of 3.0 cm today versus a S.U.V. max of 4.7 on the prior exam. Focal anal hypermetabolism including at a S.U.V. max of 11.9 on 286/3. This area is suboptimally evaluated on CT images secondary to underdistention. Incidental CT findings: Aortic atherosclerosis. Normal noncontrast appearance of the liver, gallbladder, right adrenal gland, kidneys. Pancreatic atrophy. Abdominal aortic atherosclerosis. Small abdominal retroperitoneal  and porta hepatis nodes. No pelvic sidewall adenopathy. Trace right pelvic fluid including on 259/3, increased. SKELETON: No abnormal marrow activity. Incidental CT findings: none IMPRESSION: 1. Response to therapy of dominant left upper quadrant cavitary mass, involving the greater curvature of the stomach and extending into the gastrosplenic ligament and splenic hilum. ([place]) 5 2. Persistent left adrenal hypermetabolism and mild thickening, nonspecific. 3. Focal low anal hypermetabolism for which polyp or synchronous carcinoma are possible. Consider physical exam or sigmoidoscopy. 4. Decreased trace left pleural fluid. Increased trace right pelvic fluid. 5. Incidental findings, including: Coronary artery atherosclerosis. Aortic Atherosclerosis (ICD10-I70.0). Electronically Signed   By: Abigail Miyamoto M.D.   On: 04/23/2021 15:22   US Venous Img Upper Uni Left  Result Date: 04/03/2021 CLINICAL DATA:  Left upper extremity pain and edema for the past 2 days. Evaluate for DVT. EXAM: LEFT UPPER EXTREMITY VENOUS DOPPLER ULTRASOUND TECHNIQUE: Gray-scale sonography with graded compression, as well as color Doppler and duplex ultrasound were performed to evaluate the upper extremity deep venous system from the level of the subclavian vein and including the jugular, axillary, basilic, radial, ulnar and upper cephalic vein. Spectral Doppler was utilized to evaluate flow at rest and with distal augmentation maneuvers. COMPARISON:  None. FINDINGS: Contralateral Subclavian Vein: Respiratory phasicity is normal and symmetric with the symptomatic side. No evidence of thrombus. Normal compressibility. Internal Jugular Vein: No evidence of thrombus. Normal compressibility, respiratory phasicity and response to augmentation. Subclavian Vein: No evidence of thrombus. Normal compressibility, respiratory phasicity and response to augmentation. Axillary Vein: No evidence of thrombus. Normal compressibility, respiratory phasicity  and response to augmentation. Cephalic Vein: While the cephalic vein appears patent centrally (image 19), there is hypoechoic occlusive thrombus within the cephalic vein at the level of the distal humerus (image 17; 26 through 29)) extending to the level of the proximal forearm (image 18). Basilic Vein: No evidence of thrombus. Normal compressibility, respiratory phasicity and response to augmentation. Brachial Veins: No evidence of thrombus. Normal compressibility, respiratory phasicity and response to augmentation. Radial Veins: No evidence of thrombus. Normal compressibility, respiratory phasicity and response to augmentation. Ulnar Veins: No evidence of thrombus. Normal compressibility, respiratory phasicity and response to augmentation. Other Findings:  None visualized. IMPRESSION: 1. No evidence of DVT within the left upper extremity. 2. Examination is positive for occlusive superficial thrombophlebitis involving the cephalic vein extending from the level of the distal humerus to the proximal forearm. Note, the occluded segment of the cephalic vein at the level of the forearm correlates with the patient's area of swelling. Electronically Signed   By: Sandi Mariscal M.D.   On: 04/03/2021 15:25   DG Chest Portable 1 View  Result Date: 04/21/2021 CLINICAL DATA:  Gastrointestinal bleed. vomiting bright red blood this afternoon and wife states pt did lose consciousness after vomiting. EXAM: PORTABLE CHEST 1 VIEW COMPARISON:  Chest x-ray 03/27/2021 FINDINGS: Right chest wall Port-A-Cath with tip overlying the right atrium. Prominent cardiac silhouette which may be due  to AP portable technique. The heart and mediastinal contours are within normal limits. Aortic calcification. No focal consolidation. No pulmonary edema. No pleural effusion. No pneumothorax. No acute osseous abnormality. IMPRESSION: No active disease. Electronically Signed   By: Iven Finn M.D.   On: 05/15/2021 16:09   DG Chest Portable 1  View  Result Date: 03/27/2021 CLINICAL DATA:  Shortness of breath, anemia and neutropenia EXAM: PORTABLE CHEST 1 VIEW COMPARISON:  Chest radiograph 03/10/2019 FINDINGS: There is a right chest wall port in place with tip in the mid SVC. The cardiomediastinal silhouette is within normal limits There are patchy opacities in the lateral left base. There is no other focal consolidation. There is no pulmonary edema. There is no pleural effusion or pneumothorax. There is no acute osseous abnormality. IMPRESSION: Patchy opacities in the lateral left base are favored to reflect atelectasis; however, infection cannot be excluded. Electronically Signed   By: Valetta Mole M.D.   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 These results were called by telephone at the time of interpretation on 05/11/2021 at 5:58 pm to provider Vascular surgery, who verbally acknowledged these results. Electronically Signed   By: Iven Finn M.D.   On: 04/29/2021 18:02   Result Date: 05/06/2021 CLINICAL DATA:  GI Bleed protocol. vomiting bright red blood this afternoon and wife states pt did lose consciousness after vomitin EXAM: CT ANGIOGRAPHY CHEST, ABDOMEN AND PELVIS TECHNIQUE: Non-contrast CT of the chest was initially obtained. Multidetector CT imaging through the chest, abdomen and pelvis was performed using the standard protocol during bolus administration of intravenous contrast. Multiplanar reconstructed images and MIPs were obtained and reviewed to evaluate the vascular anatomy. CONTRAST:  91mL OMNIPAQUE IOHEXOL 350 MG/ML SOLN COMPARISON:  PETCT 03/08/2021 FINDINGS: Lines and tubes: Right chest wall Port-A-Cath with tip terminating at the superior cavoatrial junction. CTA CHEST FINDINGS  Cardiovascular: Preferential opacification of the thoracic aorta. No evidence of thoracic aortic aneurysm or dissection. Normal heart size. No significant pericardial effusion. The thoracic aorta is normal in caliber. No atherosclerotic plaque of the thoracic aorta. At least 2 vessel coronary artery calcifications. Mediastinum/Nodes: No enlarged mediastinal, hilar, or axillary lymph nodes. Thyroid gland, trachea, and esophagus demonstrate no significant findings. Lungs/Pleura: No focal consolidation. No pulmonary nodule. No pulmonary mass. No pleural effusion. No pneumothorax. Elevated left hemidiaphragm. Musculoskeletal: No chest wall abnormality. No suspicious lytic or blastic osseous lesions. No acute displaced fracture. Multilevel degenerative changes of the spine. Review of the MIP images confirms the above findings. CTA ABDOMEN AND PELVIS FINDINGS VASCULAR Limited evaluation of the upper abdomen due to streak artifact originating from bilateral upper extremities. No definite active hemorrhage noted. Aorta: Atherosclerotic plaque. Normal caliber aorta without aneurysm, dissection, vasculitis or significant stenosis. Celiac: Patent without evidence of aneurysm, dissection, vasculitis or significant stenosis. SMA: Patent without evidence of aneurysm, dissection, vasculitis or significant stenosis. Renals: Both renal arteries are patent without evidence of aneurysm, dissection, vasculitis, fibromuscular dysplasia or significant stenosis. IMA: Patent without evidence of aneurysm, dissection, vasculitis or significant stenosis. Inflow: Atherosclerotic plaque. Patent without evidence of aneurysm, dissection, vasculitis or significant stenosis. Veins: The main portal, splenic, superior mesenteric veins are patent. Review of the MIP images confirms the above findings. NON-VASCULAR Hepatobiliary: No focal liver abnormality. No gallstones, gallbladder wall thickening, or pericholecystic fluid. No  biliary dilatation.  Pancreas: Distal pancreas abuts the necrotizing mass. No focal lesion. Normal pancreatic contour. No surrounding inflammatory changes. No main pancreatic ductal dilatation. Spleen: Peripheral spleen is unremarkable; however, the splenic hilum and central spleen fall are replaced with known ulcerative mass. Adrenals/Urinary Tract: Subcentimeter hypodensities are too small There is a 2 x 1.1 cm left adrenal gland nodule with a density of 93 Hounsfield units. No right adrenal nodule. Bilateral kidneys enhance symmetrically.  To characterize. No hydronephrosis. No hydroureter. The urinary bladder is unremarkable. Stomach/Bowel: Similar-appearing large (at least 6.5 x 5.5 cm) left upper quadrant ulcerative necrotizing mass with direct communication to the fundal gastric lumen. The mass is again noted to also be inseparable from the splenic parenchyma and hilum. The lesion also abuts the distal pancreas and the left hemidiaphragm. Otherwise no evidence of small or large bowel wall thickening or dilatation. Stool throughout the colon. Appendix appears normal. Lymphatic: No lymphadenopathy Reproductive: Prostate is unremarkable. Other: Trace left upper quadrant free fluid ascites. No intraperitoneal free gas. No organized fluid collection. Musculoskeletal: No abdominal wall hernia or abnormality. No suspicious lytic or blastic osseous lesions. No acute displaced fracture. Multilevel degenerative changes of the spine. Review of the MIP images confirms the above findings. IMPRESSION: 1. Similar-appearing large left upper quadrant ulcerative necrotizing mass with direct communication to the fundal gastric lumen. No definite active hemorrhage with limited evaluation due to streak artifact originating from bilateral upper extremities. The mass is again noted to also be inseparable from the splenic parenchyma and hilum. The lesion also abuts the distal pancreas and the left hemidiaphragm. 2. Trace left upper quadrant free fluid  ascites. 3. Indeterminate 2 cm left adrenal gland nodule. Finding better evaluated on PET CT where a left adrenal metastasis is suspected. 4. Scattered colonic diverticulosis with no acute diverticulitis. Electronically Signed: By: Iven Finn M.D. On: 05/11/2021 17:55   DG FLUORO GUIDED LOC OF NEEDLE/CATH TIP FOR SPINAL INJECT LT  Result Date: 04/11/2021 CLINICAL DATA:  Patient with diffuse large B-cell lymphoma request received for therapeutic lumbar puncture with intrathecal methotrexate injection. EXAM: THERAPEUTIC LUMBAR PUNCTURE UNDER FLUOROSCOPIC GUIDANCE COMPARISON:  03/21/2021 FLUOROSCOPY TIME:  Fluoroscopy Time:  0.2 minute Radiation Exposure Index (if provided by the fluoroscopic device): 9.5 mGy Number of Acquired Spot Images: 2 PROCEDURE: Informed consent was obtained from the patient prior to the procedure, including potential complications of CSF leak requiring additional procedure, paresthesias, bleeding, infection, side effects from medication, seizure, headache, allergy, and pain. A pre-procedure time-out was performed. With the patient prone, the lower back was prepped with Betadine. 1% Lidocaine was used for local anesthesia. Lumbar puncture was performed at the L4-L5 level using a 20 gauge needle with return of colorless CSF with an opening pressure of 16 cm water. Three ml of CSF was removed followed by successful injection of 12 mg of methotrexate injected into the subarachnoid space. The patient tolerated the procedure well and there were no apparent complications. A sterile dressing was applied. IMPRESSION: Technically successful fluoroscopic guided lumbar puncture intrathecal injection of methotrexate without complication. Read By: Tsosie Billing PA-C Electronically Signed   By: Jerilynn Mages.  Shick M.D.   On: 04/11/2021 10:14    Microbiology Recent Results (from the past 240 hour(s))  Urine Culture     Status: None   Collection Time: 04/18/21  9:27 AM   Specimen: Urine, Clean Catch   Result Value Ref Range Status   Specimen Description   Final    URINE, CLEAN CATCH Performed at [facility]  [place], [address] [place], [place] [address]    Special Requests NONE  Final   Culture   Final    NO GROWTH Performed at [facility], [place] [address] [place], [place] [address]    Report Status 04/19/2021 FINAL  Final  Culture, blood (routine x 2)     Status: None   Collection Time: 04/18/21  9:30 AM   Specimen: BLOOD  Result Value Ref Range Status   Specimen Description   Final    BLOOD RIGHT HAND Performed at [facility], [address], [place], [place] [address]    Special Requests   Final    BOTTLES DRAWN AEROBIC AND ANAEROBIC Blood Culture adequate volume Performed at [facility], [address] [place], [place] [address]    Culture   Final    NO GROWTH 5 DAYS Performed at [facility], [address], [place], [place] [address]    Report Status 04/23/2021 FINAL  Final  Culture, blood (routine x 2)     Status: None   Collection Time: 04/18/21  9:32 AM   Specimen: BLOOD  Result Value Ref Range Status   Specimen Description   Final    BLOOD LEFT HAND Performed at [facility], [address] [place], [place] [address]    Special Requests   Final    BOTTLES DRAWN AEROBIC AND ANAEROBIC Blood Culture adequate volume Performed at [facility], [address], [place], [place] [address]    Culture   Final    NO GROWTH 5 DAYS Performed at [facility], [address] [place], [place] [address]    Report Status 04/23/2021 FINAL  Final  Culture, blood (Routine X 2) w Reflex to ID Panel     Status: None   Collection Time: 04/18/21 11:37 AM   Specimen: BLOOD  Result Value Ref Range Status   Specimen Description   Final    BLOOD RIGHT ARM Performed at [facility], [address], [place], [place] [address]    Special Requests   Final    BOTTLES DRAWN AEROBIC AND ANAEROBIC Blood Culture  adequate volume Performed at [facility], [place] [place], [place] [address]    Culture   Final    NO GROWTH 5 DAYS Performed at [facility], [address], [place], [place] [address]    Report Status 04/23/2021 FINAL  Final  Blood culture (routine x 2)     Status: None (Preliminary result)   Collection Time: 05/15/2021  4:48 PM   Specimen: BLOOD  Result Value Ref Range Status   Specimen Description BLOOD LEFT ANTECUBITAL  Final   Special Requests   Final    BOTTLES DRAWN AEROBIC AND ANAEROBIC Blood Culture adequate volume   Culture   Final    NO GROWTH < 24 HOURS Performed at [facility], [address], [place], [place] [address]    Report Status PENDING  Incomplete  Resp Panel by RT-PCR (Flu A&B, Covid) Nasopharyngeal Swab     Status: None   Collection Time: 04/26/2021  5:54 PM   Specimen: Nasopharyngeal Swab; Nasopharyngeal(NP) swabs in vial transport medium  Result Value Ref Range Status   SARS Coronavirus 2 by RT PCR NEGATIVE NEGATIVE Final    Comment: (NOTE) SARS-CoV-2 target nucleic acids are NOT DETECTED.  The SARS-CoV-2 RNA is generally detectable in upper respiratory specimens during the acute phase of infection. The lowest concentration of  SARS-CoV-2 viral copies this assay can detect is 138 copies/mL. A negative result does not preclude SARS-Cov-2 infection and should not be used as the sole basis for treatment or other patient management decisions. A negative result may occur with  improper specimen collection/handling, submission of specimen other than nasopharyngeal swab, presence of viral mutation(s) within the areas targeted by this assay, and inadequate number of viral copies(<138 copies/mL). A negative result must be combined with clinical observations, patient history, and epidemiological information. The expected result is Negative.  Fact Sheet for Patients:  EntrepreneurPulse.com.au  Fact Sheet for  Healthcare Providers:  IncredibleEmployment.be  This test is no t yet approved or cleared by the [place] FDA and  has been authorized for detection and/or diagnosis of SARS-CoV-2 by FDA under an Emergency Use Authorization (EUA). This EUA will remain  in effect (meaning this test can be used) for the duration of the COVID-19 declaration under Section 564(b)(1) of the Act, 21 U.S.C.section 360bbb-3(b)(1), unless the authorization is terminated  or revoked sooner.       Influenza A by PCR NEGATIVE NEGATIVE Final   Influenza B by PCR NEGATIVE NEGATIVE Final    Comment: (NOTE) The Xpert Xpress SARS-CoV-2/FLU/RSV plus assay is intended as an aid in the diagnosis of influenza from Nasopharyngeal swab specimens and should not be used as a sole basis for treatment. Nasal washings and aspirates are unacceptable for Xpert Xpress SARS-CoV-2/FLU/RSV testing.  Fact Sheet for Patients: EntrepreneurPulse.com.au  Fact Sheet for Healthcare Providers: IncredibleEmployment.be  This test is not yet approved or cleared by the [place] FDA and has been authorized for detection and/or diagnosis of SARS-CoV-2 by FDA under an Emergency Use Authorization (EUA). This EUA will remain in effect (meaning this test can be used) for the duration of the COVID-19 declaration under Section 564(b)(1) of the Act, 21 U.S.C. section 360bbb-3(b)(1), unless the authorization is terminated or revoked.  Performed at [facility], [address] [place], [place] [address]   MRSA Next Gen by PCR, Nasal     Status: None   Collection Time: 05/02/2021  6:12 PM   Specimen: Nasal Mucosa; Nasal Swab  Result Value Ref Range Status   MRSA by PCR Next Gen NOT DETECTED NOT DETECTED Final    Comment: (NOTE) The GeneXpert MRSA Assay (FDA approved for NASAL specimens only), is one component of a comprehensive MRSA colonization surveillance program. It is  not intended to diagnose MRSA infection nor to guide or monitor treatment for MRSA infections. Test performance is not FDA approved in patients less than 95 years old. Performed at [facility], [address] [place], [place] [address]   Blood culture (routine x 2)     Status: None (Preliminary result)   Collection Time: 05/01/2021  6:58 PM   Specimen: BLOOD  Result Value Ref Range Status   Specimen Description BLOOD BLOOD RIGHT HAND  Final   Special Requests   Final    BOTTLES DRAWN AEROBIC ONLY Blood Culture results may not be optimal due to an inadequate volume of blood received in culture bottles   Culture   Final    NO GROWTH < 12 HOURS Performed at [facility], [address] [place],  [address]    Report Status PENDING  Incomplete    Lab Basic Metabolic Panel: Recent Labs  Lab 04/23/2021 1533 May 02, 2021 0540  NA 137 138  K 3.2* 6.3*  CL 101 107  CO2 17* 22  GLUCOSE 275* 225*  BUN  9 25*  CREATININE 1.40* 1.67*  CALCIUM 8.3* 7.0*  MG  --  1.7  PHOS  --  5.7*   Liver Function Tests: Recent Labs  Lab 05/03/2021 1533  AST 25  ALT 19  ALKPHOS 133*  BILITOT 0.4  PROT 5.9*  ALBUMIN 2.5*   No results for input(s): LIPASE, AMYLASE in the last 168 hours. No results for input(s): AMMONIA in the last 168 hours. CBC: Recent Labs  Lab 04/20/21 0759 05/13/2021 1657 04/22/2021 2225 2021-05-11 0540  WBC 7.6 42.8*  --  29.5*  NEUTROABS 5.4 34.6*  --   --   HGB 7.1* 9.1* 7.7* 5.6*  HCT 22.5* 29.0* 23.8* 17.7*  MCV 85.9 90.1  --  88.9  PLT 222 316  --  327   Cardiac Enzymes: No results for input(s): CKTOTAL, CKMB, CKMBINDEX, TROPONINI in the last 168 hours. Sepsis Labs: Recent Labs  Lab 04/20/21 0759 04/29/2021 1535 05/07/2021 1657 11-May-2021 0540  WBC 7.6  --  42.8* 29.5*  LATICACIDVEN  --  >9.0* 5.6*  --       Jessyca Sloan 05/11/2021, 9:02 AM

## 2021-05-17 NOTE — Consult Note (Signed)
Hematology/Oncology Consult note [facility] Telephone:([phone] [phone]  Patient Care Team: Sharyne Peach, MD as PCP - General (Family Medicine) Earlie Server, MD as Consulting Physician (Hematology and Oncology)   Name of the patient: Zachary Perkins  [id number]  09-Mar-1961   Date of visit: May 25, 2021 REASON FOR COSULTATION:  Lymphoma, gastric perforation  History of presenting illness-  60 y.o. male with PMH listed at below who presents to ER for evaluation of onset of hematic emesis around 2 PM on 04/26/2021. Patient is known to oncology service for large B-cell lymphoma with gastric/splenic involvement.  Previous EGD showed a large fungating mass in the stomach. Patient underwent 2 cycles of R-CHOP with intrathecal methotrexate treatments and has had posttreatment PET scan done which showed decrease of hypermetabolic activity of the lymphoma.  Patient has had complication of anemia and has had IV Venofer treatments as well as PRBC transfusion outpatient. In the emergency room, was hypotensive and admitted to ICU for volume resuscitate patient has had PRBC transfusions. CT angiogram of chest abdomen pelvis showed similar-appearing large left upper quadrant ulcerative necrotizing mass with direct communication to the fundal gastric lumen.  Trace left upper quadrant free fluid ascites.  Indeterminate 2 cm left adrenal gland.  Colonic diverticulosis with no acute diverticulitis.  GI was consulted and patient was seen by Dr. Bonna Gains who deferred EGD. Patient was also seen by surgery Dr. Lysle Pearl.  Vascular surgery was consulted. It was felt that the only way to stop/prevent bleeding/perforation was enbloc resection of the tumor with surrounding organs, which will mor than likely be incompatible with life.  Overnight patient remains hypotensive.  Pressors were started.  Patient continues to have additional episodes of hematemesis. Oncology was consulted. Patient  was seen at bedside. BP 69/41, HR 130s. Wife is at bedside.   Review of Systems  Unable to perform ROS: Acuity of condition   Allergies  Allergen Reactions   Lisinopril Cough   Losartan Diarrhea   Metformin And Related     PASSED OUT    Patient Active Problem List   Diagnosis Date Noted   Upper GI bleed 04/21/2021   Symptomatic anemia 04/17/2021   Chemotherapy-induced nausea 04/17/2021   Thrombophlebitis 04/10/2021   Chemotherapy induced neutropenia ([place]) 03/27/2021   Neoplasm related pain 03/27/2021   Encounter for antineoplastic chemotherapy 03/20/2021   Hypercalcemia 03/15/2021   Non-intractable vomiting 03/15/2021   AKI (acute kidney injury) ([place]) 03/15/2021   DLBCL (diffuse large B cell lymphoma) ([place]) 03/14/2021   Morbid obesity ([place]) 03/05/2021   Goals of care, counseling/discussion 02/28/2021   Iron deficiency anemia due to chronic blood loss    Gastric mass 02/20/2021   Weight loss 02/20/2021   Anemia 02/20/2021   Diabetes mellitus type II, controlled ([place]) 02/20/2021   Nausea in adult 02/20/2021   Constipation 02/20/2021   Essential hypertension 02/20/2021   Hypothyroidism 24/26/8341   Umbilical hernia without obstruction and without gangrene 11/12/2016   Prediabetes 11/23/2015     Past Medical History:  Diagnosis Date   Diabetes mellitus without complication ([place])    Diffuse large B cell lymphoma ([place])    Hypertension    Hypothyroidism    Morbid obesity ([place])      Past Surgical History:  Procedure Laterality Date   CARPAL TUNNEL RELEASE Left    ESOPHAGOGASTRODUODENOSCOPY (EGD) WITH PROPOFOL N/A 02/21/2021   Procedure: ESOPHAGOGASTRODUODENOSCOPY (EGD) WITH PROPOFOL;  Surgeon: Annamaria Helling, DO;  Location: [facility] ENDOSCOPY;  Service: Gastroenterology;  Laterality: N/A;  after 2pm   INSERTION OF MESH N/A 05/02/2017   Procedure: INSERTION OF MESH;  Surgeon: Leonie Green, MD;  Location: [facility] ORS;  Service: General;  Laterality: N/A;    lasix eye surgery     PORTACATH PLACEMENT Right 03/09/2021   Procedure: INSERTION PORT-A-CATH;  Surgeon: Jules Husbands, MD;  Location: [facility] ORS;  Service: General;  Laterality: Right;   UMBILICAL HERNIA REPAIR N/A 05/02/2017   Procedure: HERNIA REPAIR UMBILICAL ADULT;  Surgeon: Leonie Green, MD;  Location: [facility] ORS;  Service: General;  Laterality: N/A;    Social History   Socioeconomic History   Marital status: Married    Spouse name: Melonie   Number of children: 0   Years of education: 12   Highest education level: 12th grade  Occupational History   Not on file  Tobacco Use   Smoking status: Former    Packs/day: 1.00    Years: 20.00    Pack years: 20.00    Types: Cigarettes    Quit date: 04/25/2007    Years since quitting: 14.0   Smokeless tobacco: Never  Vaping Use   Vaping Use: Never used  Substance and Sexual Activity   Alcohol use: Not Currently    Comment: rare   Drug use: No   Sexual activity: Yes  Other Topics Concern   Not on file  Social History Narrative   Not on file   Social Determinants of Health   Financial Resource Strain: Not on file  Food Insecurity: Not on file  Transportation Needs: Not on file  Physical Activity: Not on file  Stress: Not on file  Social Connections: Not on file  Intimate Partner Violence: Not on file     Family History  Problem Relation Age of Onset   COPD Father    Emphysema Father    Cancer - Lung Brother      Current Facility-Administered Medications:    0.9 % NaCl with KCl 20 mEq/ L  infusion, , Intravenous, Continuous, Tyler Pita, MD, Stopped at 05/03/2021 [phone]   Chlorhexidine Gluconate Cloth 2 % PADS 6 each, 6 each, Topical, Q0600, Tyler Pita, MD, 6 each at 2021/05/03 0505   docusate sodium (COLACE) capsule 100 mg, 100 mg, Oral, BID PRN, Tyler Pita, MD   [COMPLETED] fluconazole (DIFLUCAN) IVPB 800 mg, 800 mg, Intravenous, Once, Last Rate: 200 mL/hr at 04/22/2021 2153, 800 mg at 04/23/2021  2153 **FOLLOWED BY** fluconazole (DIFLUCAN) IVPB 400 mg, 400 mg, Intravenous, Q24H, Tyler Pita, MD   HYDROmorphone (DILAUDID) injection 1 mg, 1 mg, Intravenous, Q3H PRN, Lang Snow, NP, 1 mg at May 03, 2021 0606   meropenem (MERREM) 1 g in sodium chloride 0.9 % 100 mL IVPB, 1 g, Intravenous, Q8H, Tyler Pita, MD, Last Rate: 200 mL/hr at 05-03-21 0457, 1 g at May 03, 2021 0457   midazolam (VERSED) injection 2 mg, 2 mg, Intravenous, Q1H PRN, Flora Lipps, MD   morphine 2 MG/ML injection 2-4 mg, 2-4 mg, Intravenous, Q1H PRN, Mortimer Fries, Maretta Bees, MD, 2 mg at 05-03-21 0812   norepinephrine (LEVOPHED) 4-5 MG/250ML-% infusion SOLN, , , ,    norepinephrine (LEVOPHED) 4mg  in 213mL premix infusion, 0-40 mcg/min, Intravenous, Titrated, [place], Bing Neighbors, NP, Last Rate: 7.5 mL/hr at 2021-05-03 0448, 2 mcg/min at May 03, 2021 0448   ondansetron (ZOFRAN) injection 4 mg, 4 mg, Intravenous, Q6H PRN, Tyler Pita, MD, 4 mg at 2021-05-03 0251   [START ON 04/28/2021] pantoprazole (PROTONIX) injection 40 mg, 40 mg, Intravenous, Q12H,  Vanessa [place], MD   pantoprozole (PROTONIX) 80 mg /NS 100 mL infusion, 8 mg/hr, Intravenous, Continuous, Vanessa Fleming, MD, Last Rate: 10 mL/hr at 04/29/21 0409, 8 mg/hr at 2021/04/29 0409   polyethylene glycol (MIRALAX / GLYCOLAX) packet 17 g, 17 g, Oral, Daily PRN, Tyler Pita, MD   promethazine (PHENERGAN) 25 mg in sodium chloride 0.9 % 50 mL IVPB, 25 mg, Intravenous, Q6H PRN, Lang Snow, NP, Stopped at 04-29-21 0410   Physical exam:  Vitals:   29-Apr-2021 0703 04-29-21 0715 2021-04-29 0730 Apr 29, 2021 0745  BP: (!) 86/60 (!) 75/58 (!) 106/93 (!) 68/42  Pulse: (!) 104 (!) 117 (!) 129   Resp: 12 18 (!) 30 (!) 28  Temp:      TempSrc:      SpO2: 100% 100% 100%   Weight:      Height:       Physical Exam HENT:     Mouth/Throat:     Pharynx: No oropharyngeal exudate.  Cardiovascular:     Rate and Rhythm: Tachycardia present.     Heart sounds: No  murmur heard. Pulmonary:     Comments: Agonal breathing  Skin:    Coloration: Skin is pale.  Neurological:     Motor: No abnormal muscle tone.     Comments: lethargic  Psychiatric:        Mood and Affect: Affect normal.        CMP Latest Ref Rng & Units 04/29/2021  Glucose 70 - 99 mg/dL 225(H)  BUN 6 - 20 mg/dL 25(H)  Creatinine 0.61 - 1.24 mg/dL 1.67(H)  Sodium 135 - 145 mmol/L 138  Potassium 3.5 - 5.1 mmol/L 6.3(HH)  Chloride 98 - 111 mmol/L 107  CO2 22 - 32 mmol/L 22  Calcium 8.9 - 10.3 mg/dL 7.0(L)  Total Protein 6.5 - 8.1 g/dL -  Total Bilirubin 0.3 - 1.2 mg/dL -  Alkaline Phos 38 - 126 U/L -  AST 15 - 41 U/L -  ALT 0 - 44 U/L -   CBC Latest Ref Rng & Units Apr 29, 2021  WBC 4.0 - 10.5 K/uL 29.5(H)  Hemoglobin 13.0 - 17.0 g/dL 5.6(L)  Hematocrit 39.0 - 52.0 % 17.7(L)  Platelets 150 - 400 K/uL 327    RADIOGRAPHIC STUDIES: I have personally reviewed the radiological images as listed and agreed with the findings in the report. CT HEAD WO CONTRAST (5MM)  Result Date: 05/10/2021 CLINICAL DATA:  Mental status change.  Unknown cause. EXAM: CT HEAD WITHOUT CONTRAST TECHNIQUE: Contiguous axial images were obtained from the base of the skull through the vertex without intravenous contrast. COMPARISON:  None FINDINGS: Brain: No evidence of large-territorial acute infarction. No parenchymal hemorrhage. No mass lesion. No extra-axial collection. No mass effect or midline shift. No hydrocephalus. Basilar cisterns are patent. Enlarged pituitary gland measuring up to at least 1.3 cm. Vascular: No hyperdense vessel. Skull: No acute fracture or focal lesion. Sinuses/Orbits: Paranasal sinuses and mastoid air cells are clear. The orbits are unremarkable. Other: None. IMPRESSION: 1. Enlarged pituitary gland. Recommend MRI sella protocol further evaluation. 2. Otherwise no acute intracranial abnormality. Electronically Signed   By: Iven Finn M.D.   On: 05/10/2021 17:57   NM PET Image  Restag (PS) Skull Base To Thigh  Result Date: 04/23/2021 CLINICAL DATA:  Subsequent treatment strategy for diffuse large B-cell lymphoma. Chemotherapy 03/09/2021. EXAM: NUCLEAR MEDICINE PET SKULL BASE TO THIGH TECHNIQUE: 14.5 mCi F-18 FDG was injected intravenously. Full-ring PET imaging was performed from the skull  base to thigh after the radiotracer. CT data was obtained and used for attenuation correction and anatomic localization. Fasting blood glucose: 91 mg/dl COMPARISON:  03/08/2021 FINDINGS: Mediastinal blood pool activity: SUV max 2.0 Liver activity: SUV max 3.5 NECK: No areas of abnormal hypermetabolism. Incidental CT findings: No cervical adenopathy. CHEST: No pulmonary parenchymal or thoracic nodal hypermetabolism. Incidental CT findings: Right Port-A-Cath tip high right atrium. Aortic and coronary artery calcification. Trace left pleural fluid, decreased. ABDOMEN/PELVIS: No abdominopelvic nodal hypermetabolism. The cavitary mass centered about the greater curvature of the stomach with extension into the gastrosplenic ligament and splenic hilum is decreased. Difficult to measure secondary to its amorphous appearance. Correlate hypermetabolism along the periphery, including at a S.U.V. max of 8.0. Compare a S.U.V. max of 33.5 on the prior exam. Mild left adrenal thickening with hypermetabolism at a S.U.V. max of 3.0 cm today versus a S.U.V. max of 4.7 on the prior exam. Focal anal hypermetabolism including at a S.U.V. max of 11.9 on 286/3. This area is suboptimally evaluated on CT images secondary to underdistention. Incidental CT findings: Aortic atherosclerosis. Normal noncontrast appearance of the liver, gallbladder, right adrenal gland, kidneys. Pancreatic atrophy. Abdominal aortic atherosclerosis. Small abdominal retroperitoneal and porta hepatis nodes. No pelvic sidewall adenopathy. Trace right pelvic fluid including on 259/3, increased. SKELETON: No abnormal marrow activity. Incidental CT  findings: none IMPRESSION: 1. Response to therapy of dominant left upper quadrant cavitary mass, involving the greater curvature of the stomach and extending into the gastrosplenic ligament and splenic hilum. ([place]) 5 2. Persistent left adrenal hypermetabolism and mild thickening, nonspecific. 3. Focal low anal hypermetabolism for which polyp or synchronous carcinoma are possible. Consider physical exam or sigmoidoscopy. 4. Decreased trace left pleural fluid. Increased trace right pelvic fluid. 5. Incidental findings, including: Coronary artery atherosclerosis. Aortic Atherosclerosis (ICD10-I70.0). Electronically Signed   By: Abigail Miyamoto M.D.   On: 04/23/2021 15:22   US Venous Img Upper Uni Left  Result Date: 04/03/2021 CLINICAL DATA:  Left upper extremity pain and edema for the past 2 days. Evaluate for DVT. EXAM: LEFT UPPER EXTREMITY VENOUS DOPPLER ULTRASOUND TECHNIQUE: Gray-scale sonography with graded compression, as well as color Doppler and duplex ultrasound were performed to evaluate the upper extremity deep venous system from the level of the subclavian vein and including the jugular, axillary, basilic, radial, ulnar and upper cephalic vein. Spectral Doppler was utilized to evaluate flow at rest and with distal augmentation maneuvers. COMPARISON:  None. FINDINGS: Contralateral Subclavian Vein: Respiratory phasicity is normal and symmetric with the symptomatic side. No evidence of thrombus. Normal compressibility. Internal Jugular Vein: No evidence of thrombus. Normal compressibility, respiratory phasicity and response to augmentation. Subclavian Vein: No evidence of thrombus. Normal compressibility, respiratory phasicity and response to augmentation. Axillary Vein: No evidence of thrombus. Normal compressibility, respiratory phasicity and response to augmentation. Cephalic Vein: While the cephalic vein appears patent centrally (image 19), there is hypoechoic occlusive thrombus within the cephalic  vein at the level of the distal humerus (image 17; 26 through 29)) extending to the level of the proximal forearm (image 18). Basilic Vein: No evidence of thrombus. Normal compressibility, respiratory phasicity and response to augmentation. Brachial Veins: No evidence of thrombus. Normal compressibility, respiratory phasicity and response to augmentation. Radial Veins: No evidence of thrombus. Normal compressibility, respiratory phasicity and response to augmentation. Ulnar Veins: No evidence of thrombus. Normal compressibility, respiratory phasicity and response to augmentation. Other Findings:  None visualized. IMPRESSION: 1. No evidence of DVT within the left upper extremity. 2. Examination  is positive for occlusive superficial thrombophlebitis involving the cephalic vein extending from the level of the distal humerus to the proximal forearm. Note, the occluded segment of the cephalic vein at the level of the forearm correlates with the patient's area of swelling. Electronically Signed   By: Sandi Mariscal M.D.   On: 04/03/2021 15:25   DG Chest Portable 1 View  Result Date: 04/26/2021 CLINICAL DATA:  Gastrointestinal bleed. vomiting bright red blood this afternoon and wife states pt did lose consciousness after vomiting. EXAM: PORTABLE CHEST 1 VIEW COMPARISON:  Chest x-ray 03/27/2021 FINDINGS: Right chest wall Port-A-Cath with tip overlying the right atrium. Prominent cardiac silhouette which may be due to AP portable technique. The heart and mediastinal contours are within normal limits. Aortic calcification. No focal consolidation. No pulmonary edema. No pleural effusion. No pneumothorax. No acute osseous abnormality. IMPRESSION: No active disease. Electronically Signed   By: Iven Finn M.D.   On: 04/18/2021 16:09   DG Chest Portable 1 View  Result Date: 03/27/2021 CLINICAL DATA:  Shortness of breath, anemia and neutropenia EXAM: PORTABLE CHEST 1 VIEW COMPARISON:  Chest radiograph 03/10/2019  FINDINGS: There is a right chest wall port in place with tip in the mid SVC. The cardiomediastinal silhouette is within normal limits There are patchy opacities in the lateral left base. There is no other focal consolidation. There is no pulmonary edema. There is no pleural effusion or pneumothorax. There is no acute osseous abnormality. IMPRESSION: Patchy opacities in the lateral left base are favored to reflect atelectasis; however, infection cannot be excluded. Electronically Signed   By: Valetta Mole M.D.   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 These results were called by telephone at the time of interpretation on 04/28/2021 at 5:58 pm to provider Vascular surgery, who verbally acknowledged these results. Electronically Signed   By: Iven Finn M.D.   On: 05/06/2021 18:02   Result Date: 05/09/2021 CLINICAL DATA:  GI Bleed protocol. vomiting bright red blood this afternoon and wife states pt did lose consciousness after vomitin EXAM: CT ANGIOGRAPHY CHEST, ABDOMEN AND PELVIS TECHNIQUE: Non-contrast CT of the chest was initially obtained. Multidetector CT imaging through the chest, abdomen and pelvis was performed using the standard protocol during bolus administration of intravenous contrast. Multiplanar reconstructed images and MIPs were obtained and reviewed to evaluate the vascular anatomy. CONTRAST:  60mL OMNIPAQUE IOHEXOL 350 MG/ML SOLN COMPARISON:  PETCT 03/08/2021 FINDINGS: Lines and tubes: Right chest wall Port-A-Cath with tip terminating at the superior cavoatrial junction. CTA CHEST FINDINGS Cardiovascular: Preferential opacification of the thoracic aorta. No evidence of thoracic aortic aneurysm or dissection. Normal heart size. No significant  pericardial effusion. The thoracic aorta is normal in caliber. No atherosclerotic plaque of the thoracic aorta. At least 2 vessel coronary artery calcifications. Mediastinum/Nodes: No enlarged mediastinal, hilar, or axillary lymph nodes. Thyroid gland, trachea, and esophagus demonstrate no significant findings. Lungs/Pleura: No focal consolidation. No pulmonary nodule. No pulmonary mass. No pleural effusion. No pneumothorax. Elevated left hemidiaphragm. Musculoskeletal: No chest wall abnormality. No suspicious lytic or blastic osseous lesions. No acute displaced fracture. Multilevel degenerative changes of the spine. Review of the MIP images confirms the above findings. CTA ABDOMEN AND PELVIS FINDINGS VASCULAR Limited evaluation of the upper abdomen due to streak artifact originating from bilateral upper  extremities. No definite active hemorrhage noted. Aorta: Atherosclerotic plaque. Normal caliber aorta without aneurysm, dissection, vasculitis or significant stenosis. Celiac: Patent without evidence of aneurysm, dissection, vasculitis or significant stenosis. SMA: Patent without evidence of aneurysm, dissection, vasculitis or significant stenosis. Renals: Both renal arteries are patent without evidence of aneurysm, dissection, vasculitis, fibromuscular dysplasia or significant stenosis. IMA: Patent without evidence of aneurysm, dissection, vasculitis or significant stenosis. Inflow: Atherosclerotic plaque. Patent without evidence of aneurysm, dissection, vasculitis or significant stenosis. Veins: The main portal, splenic, superior mesenteric veins are patent. Review of the MIP images confirms the above findings. NON-VASCULAR Hepatobiliary: No focal liver abnormality. No gallstones, gallbladder wall thickening, or pericholecystic fluid. No biliary dilatation. Pancreas: Distal pancreas abuts the necrotizing mass. No focal lesion. Normal pancreatic contour. No surrounding inflammatory changes. No main pancreatic  ductal dilatation. Spleen: Peripheral spleen is unremarkable; however, the splenic hilum and central spleen fall are replaced with known ulcerative mass. Adrenals/Urinary Tract: Subcentimeter hypodensities are too small There is a 2 x 1.1 cm left adrenal gland nodule with a density of 93 Hounsfield units. No right adrenal nodule. Bilateral kidneys enhance symmetrically.  To characterize. No hydronephrosis. No hydroureter. The urinary bladder is unremarkable. Stomach/Bowel: Similar-appearing large (at least 6.5 x 5.5 cm) left upper quadrant ulcerative necrotizing mass with direct communication to the fundal gastric lumen. The mass is again noted to also be inseparable from the splenic parenchyma and hilum. The lesion also abuts the distal pancreas and the left hemidiaphragm. Otherwise no evidence of small or large bowel wall thickening or dilatation. Stool throughout the colon. Appendix appears normal. Lymphatic: No lymphadenopathy Reproductive: Prostate is unremarkable. Other: Trace left upper quadrant free fluid ascites. No intraperitoneal free gas. No organized fluid collection. Musculoskeletal: No abdominal wall hernia or abnormality. No suspicious lytic or blastic osseous lesions. No acute displaced fracture. Multilevel degenerative changes of the spine. Review of the MIP images confirms the above findings. IMPRESSION: 1. Similar-appearing large left upper quadrant ulcerative necrotizing mass with direct communication to the fundal gastric lumen. No definite active hemorrhage with limited evaluation due to streak artifact originating from bilateral upper extremities. The mass is again noted to also be inseparable from the splenic parenchyma and hilum. The lesion also abuts the distal pancreas and the left hemidiaphragm. 2. Trace left upper quadrant free fluid ascites. 3. Indeterminate 2 cm left adrenal gland nodule. Finding better evaluated on PET CT where a left adrenal metastasis is suspected. 4. Scattered  colonic diverticulosis with no acute diverticulitis. Electronically Signed: By: Iven Finn M.D. On: 05/15/2021 17:55   DG FLUORO GUIDED LOC OF NEEDLE/CATH TIP FOR SPINAL INJECT LT  Result Date: 04/11/2021 CLINICAL DATA:  Patient with diffuse large B-cell lymphoma request received for therapeutic lumbar puncture with intrathecal methotrexate injection. EXAM: THERAPEUTIC LUMBAR PUNCTURE UNDER FLUOROSCOPIC GUIDANCE COMPARISON:  03/21/2021 FLUOROSCOPY TIME:  Fluoroscopy Time:  0.2 minute Radiation Exposure Index (if provided by the fluoroscopic device): 9.5 mGy Number of Acquired Spot Images: 2 PROCEDURE: Informed consent was obtained from the patient prior to the procedure, including potential complications of CSF leak requiring additional procedure, paresthesias, bleeding, infection, side effects from medication, seizure, headache, allergy, and pain. A pre-procedure time-out was performed. With the patient prone, the lower back was prepped with Betadine. 1% Lidocaine was used for local anesthesia. Lumbar puncture was performed at the L4-L5 level using a 20 gauge needle with return of colorless CSF with an opening pressure of 16 cm water. Three ml of CSF was removed followed by successful injection  of 12 mg of methotrexate injected into the subarachnoid space. The patient tolerated the procedure well and there were no apparent complications. A sterile dressing was applied. IMPRESSION: Technically successful fluoroscopic guided lumbar puncture intrathecal injection of methotrexate without complication. Read By: Tsosie Billing PA-C Electronically Signed   By: Jerilynn Mages.  Shick M.D.   On: 04/11/2021 10:14    Assessment and plan-   Diffuse Large B cell lymphoma with gastric and spleen involvement s/p 2 cycles of RCHOP + IT MTX with GCSF support. PET2 showed positive treatment response. 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  Prognosis is  extremely poor.  Currently he is not hemodynamically stable. GI and surgery recommendation were noted. I had a discussion with his wife at bedside. Patient is critically ill and approaching end of life. Palliative care has been consulted for comfort are. All questions answered.    Thank you for allowing me to participate in the care of this patient.    Earlie Server, MD, PhD Hematology Oncology  05/05/2021

## 2021-05-17 NOTE — Progress Notes (Signed)
GOALS OF CARE DISCUSSION  The Clinical status was relayed to family in detail. Wife at bedsie Updated and notified of patients medical condition.    Patient remains unresponsive and will not open eyes to command.   Patient is having a weak cough and struggling to remove secretions.   Patient with increased WOB and using accessory muscles to breathe Explained to family course of therapy and the modalities    Patient with Progressive multiorgan failure with a very high probablity of a very minimal chance of meaningful recovery despite all aggressive and optimal medical therapy.    Family understands the situation.  Wife has consented and agreed to DNR/DNI  Will add Morphine and Versed as needed Patient is in the dying process   Family are satisfied with Plan of action and management. All questions answered  Additional CC time 35 mins   Mileidy Atkin Patricia Pesa, M.D.  [facility]  Medical Director [place] Director [facility] Cardio-Pulmonary Department

## 2021-05-17 NOTE — Progress Notes (Signed)
Called into pt room by NT, pt had sudden change in mentation, agonal breathing noted, pale, very diaphoretic, pupils nonresponsive, family called to bedside, MD notified of changes, will continue to monitor.

## 2021-05-17 DEATH — deceased

## 2021-06-18 ENCOUNTER — Other Ambulatory Visit: Payer: Self-pay | Admitting: Oncology

## 2022-07-21 IMAGING — RF DG FLUORO GUIDE SPINAL/SI JT INJ*L*
2 series · 2 of 2 positions shown · non-contrast
Comparison: none

CLINICAL DATA: Patient with diffuse large B-cell lymphoma request
received for diagnostic and therapeutic lumbar puncture.

EXAM:
FLUOROSCOPICALLY GUIDED LUMBAR PUNCTURE FOR INTRATHECAL
CHEMOTHERAPY AND DIAGNOSTIC LUMBAR PUNCTURE
TECHNIQUE: Informed consent was obtained from the patient prior to the
procedure, including potential complications of CSF leak requiring
additional procedure, paresthesias, bleeding, infection, side
effects from medication, seizures, headache, allergy, and pain. A
'time out' was performed. With the patient prone, the lower back was
prepped with Betadine. 1% Lidocaine was used for local anesthesia.
Lumbar puncture was performed at the L4-L5 using a 20 gauge needle
with return of clear CSF. A total of 9 mL of CSF fluid was obtained
and sent to the laboratory for analysis. Opening pressure 15 cm of
H2O. 12 mg of methotrexate was injected into the subarachnoid space.
The patient tolerated the procedure well without apparent
complication. A sterile dressing was applied.
FLUOROSCOPY TIME:  0.2 minute, 2 images

[Series 1: fluoro_iodine 2fps_bw · 0.17mm/px · 1 of 1 slices shown]
[im 1/1]
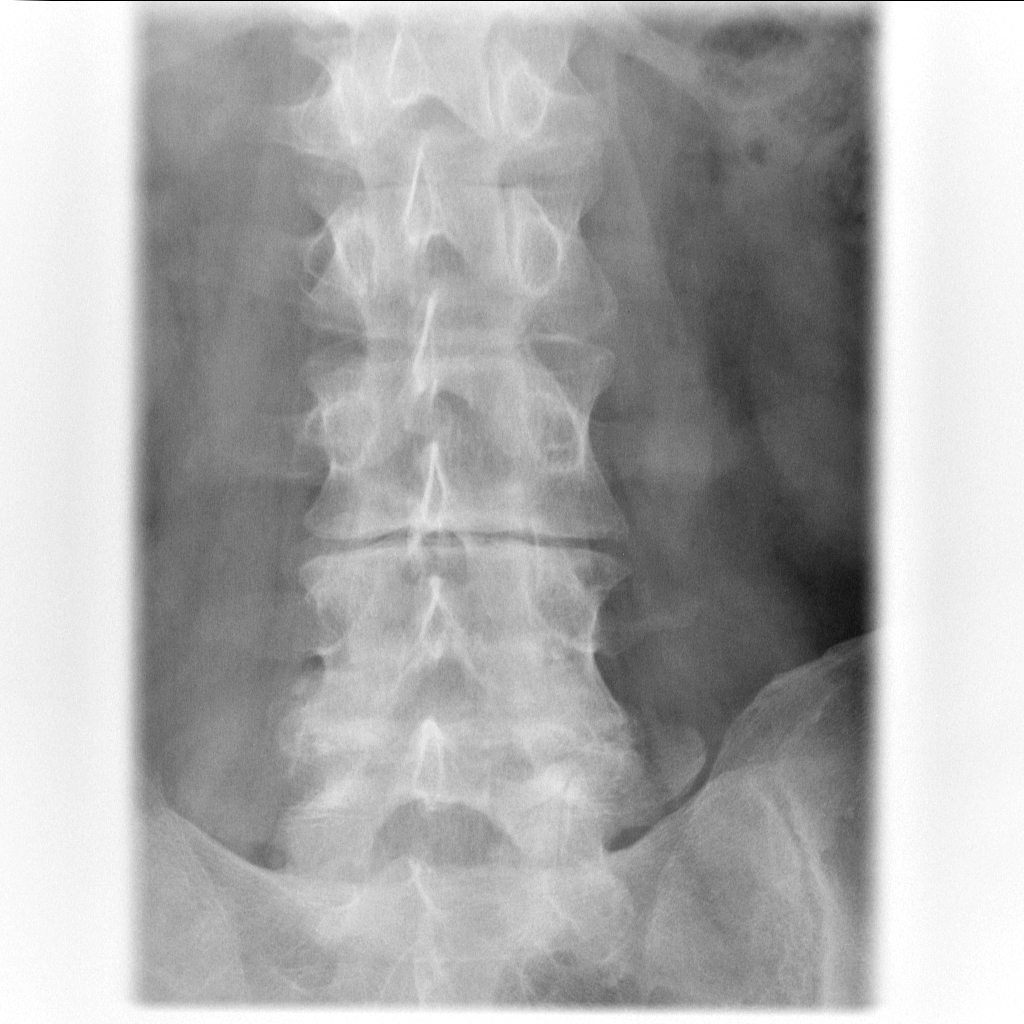

[Series 2: cp_standard · 0.17mm/px · 1 of 1 slices shown]
[im 1/1]
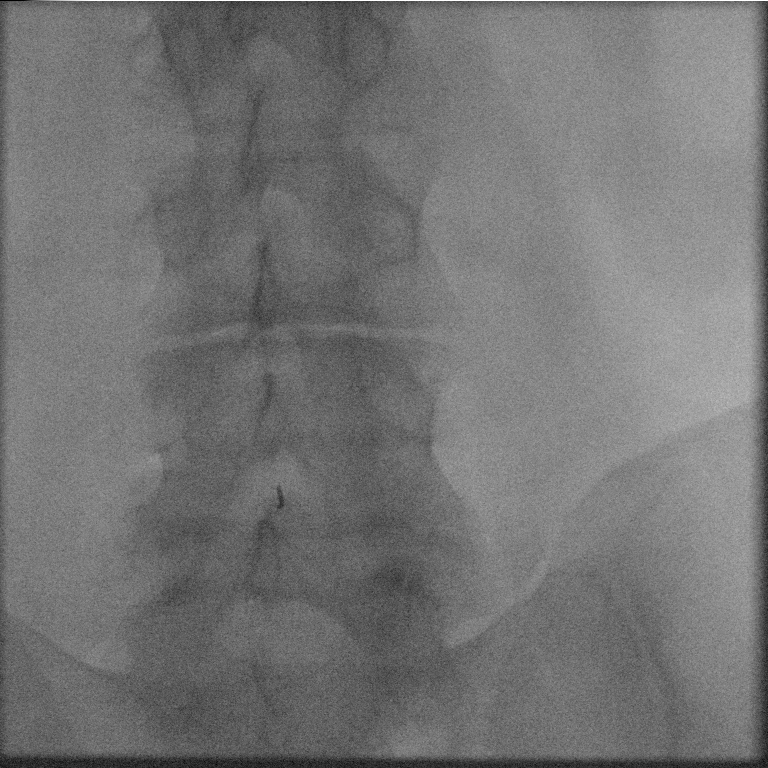

[2 of 2 positions shown; findings below may reference images not displayed]

IMPRESSION: Technically successful fluoroscopic guided lumbar puncture.
Intrathecal injection of chemotherapy without complication.

## 2022-08-11 IMAGING — RF DG FLUORO GUIDE SPINAL/SI JT INJ*L*
2 series · 4 of 4 positions shown · non-contrast
Comparison: 03/21/2021

CLINICAL DATA: Patient with diffuse large B-cell lymphoma request
received for therapeutic lumbar puncture with intrathecal
methotrexate injection.

EXAM:
THERAPEUTIC LUMBAR PUNCTURE UNDER FLUOROSCOPIC GUIDANCE

[Series 1: fluoro_iodine bariatric 2fps_bw · 0.19mm/px · 1 of 1 slices shown]
[im 1/1]
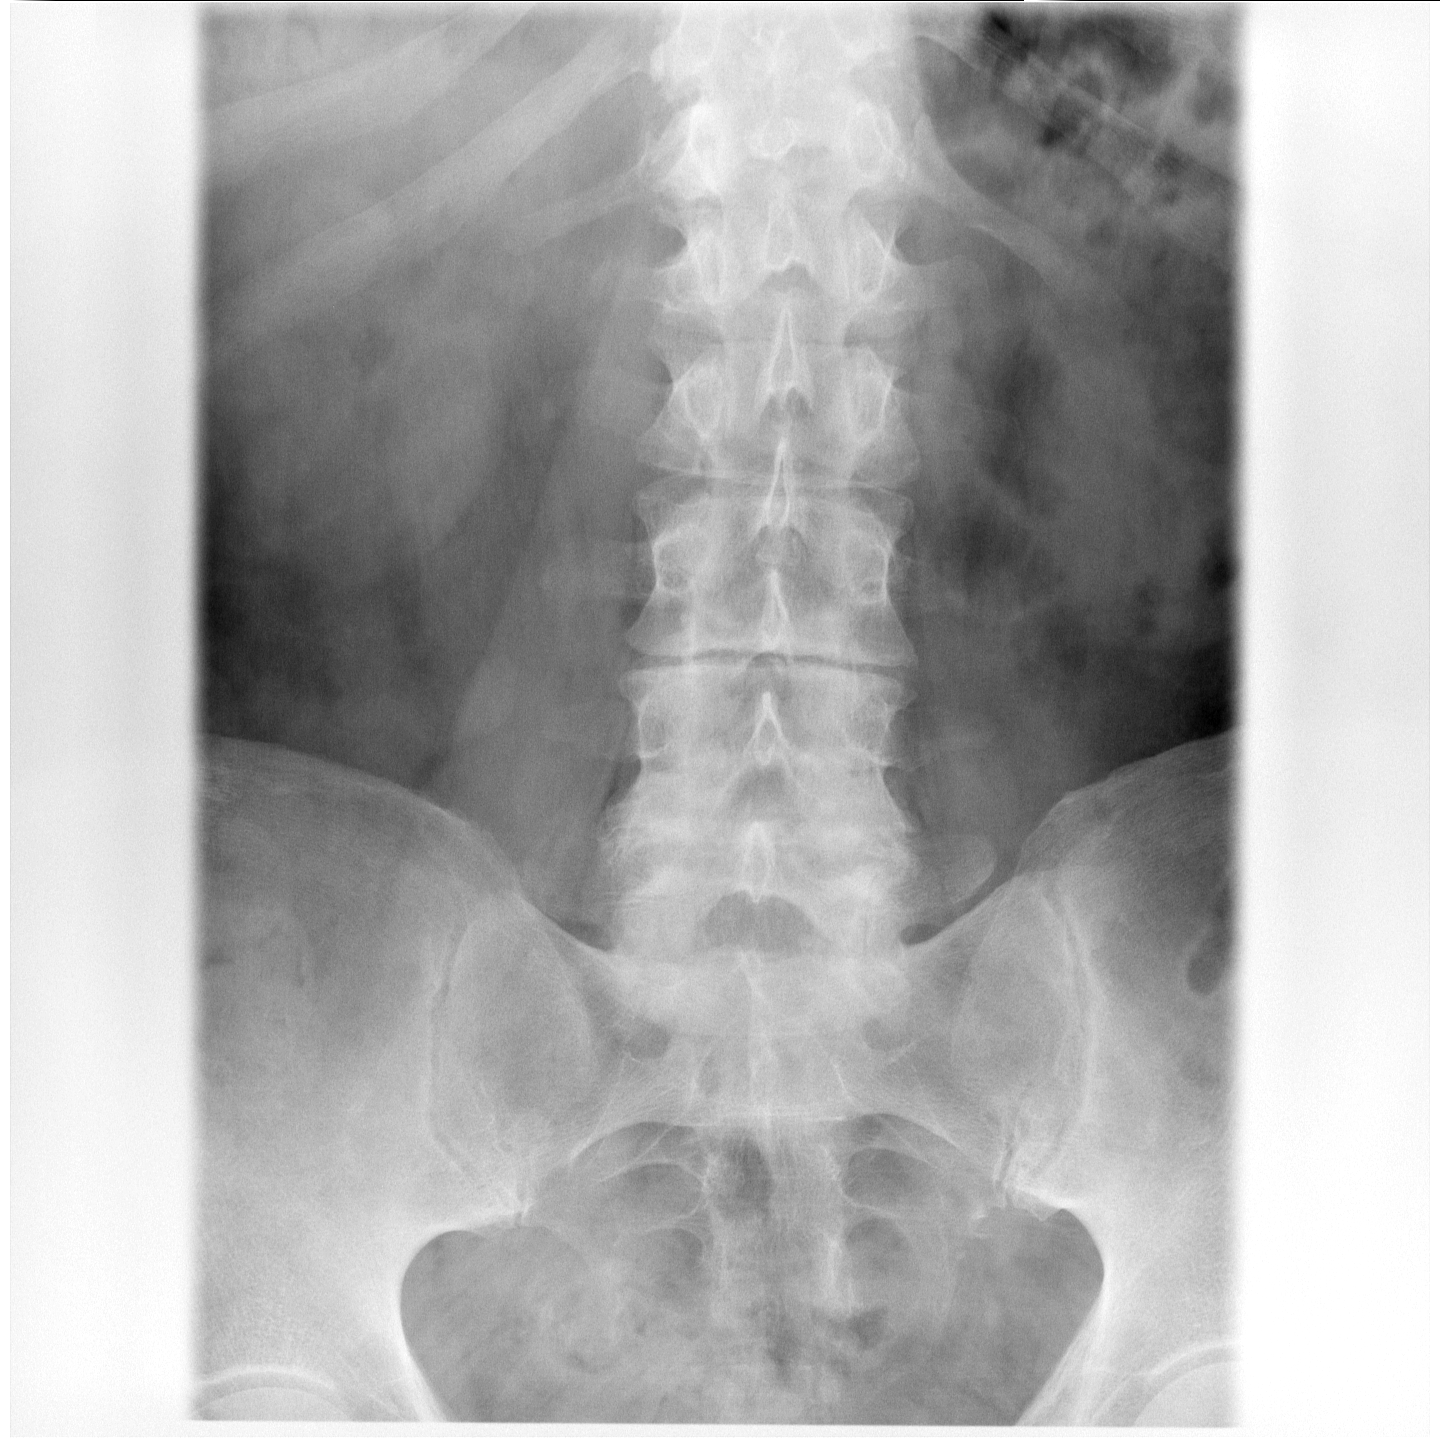

[Series 2: cp_bariatric · 0.18mm/px · 3 of 3 frames shown]
[frame 1/3]
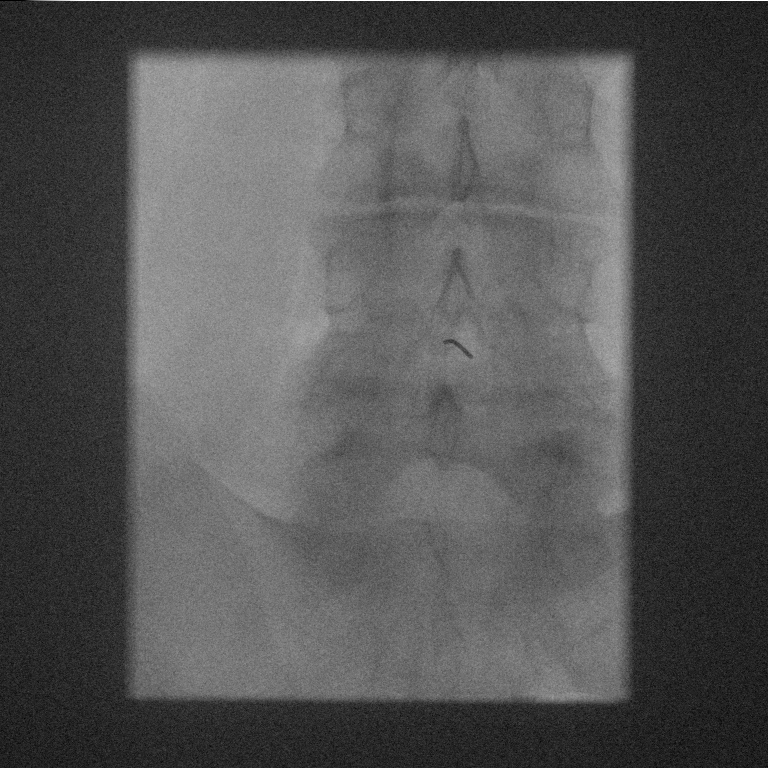
[frame 2/3]
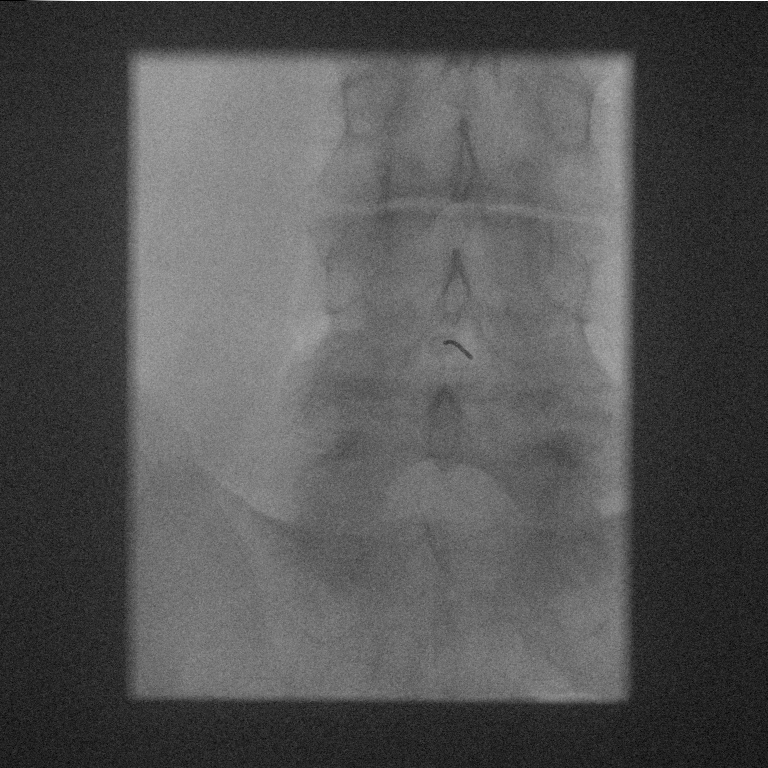
[frame 3/3]
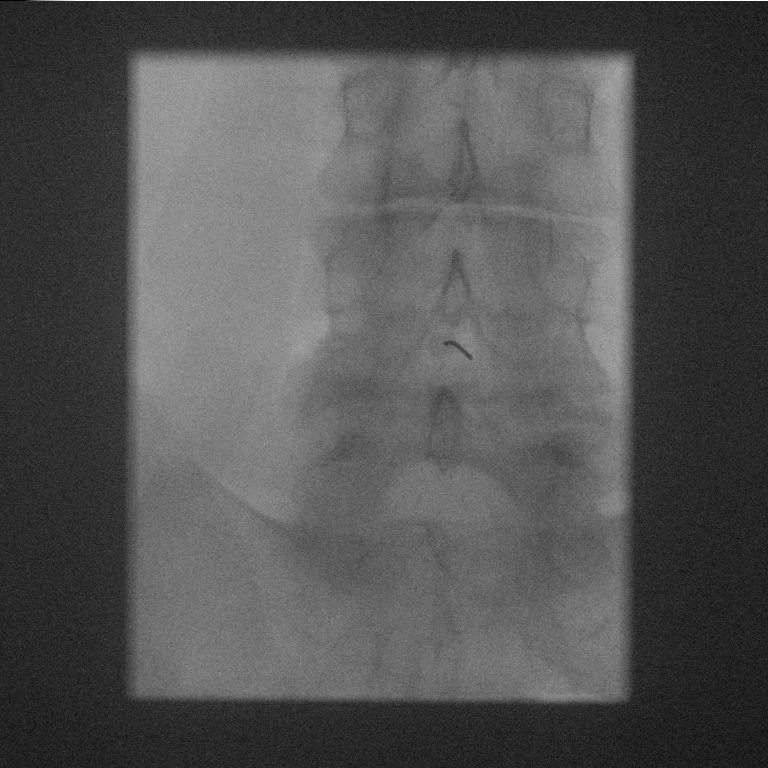

[4 of 4 positions shown; findings below may reference images not displayed]

FLUOROSCOPY TIME:  Fluoroscopy Time:  0.2 minute

Radiation Exposure Index (if provided by the fluoroscopic device):
9.5 mGy

Number of Acquired Spot Images: 2

PROCEDURE:
Informed consent was obtained from the patient prior to the
procedure, including potential complications of CSF leak requiring
additional procedure, paresthesias, bleeding, infection, side
effects from medication, seizure, headache, allergy, and pain. A
pre-procedure time-out was performed. With the patient prone, the
lower back was prepped with Betadine. 1% Lidocaine was used for
local anesthesia. Lumbar puncture was performed at the L4-L5 level
using a 20 gauge needle with return of colorless CSF with an opening
pressure of 16 cm water. Three ml of CSF was removed followed by
successful injection of 12 mg of methotrexate injected into the
subarachnoid space. The patient tolerated the procedure well and
there were no apparent complications. A sterile dressing was
applied.
IMPRESSION: Technically successful fluoroscopic guided lumbar puncture
intrathecal injection of methotrexate without complication.
